# Patient Record
Sex: Male | Born: 1960 | Hispanic: No | Marital: Married | Smoking: Current every day smoker
Health system: Southern US, Community
[De-identification: ages and names within clinical notes are randomized; demographics above are authoritative.]

## PROBLEM LIST (undated history)

## (undated) DIAGNOSIS — A488 Other specified bacterial diseases: Secondary | ICD-10-CM

## (undated) DIAGNOSIS — F1910 Other psychoactive substance abuse, uncomplicated: Secondary | ICD-10-CM

## (undated) DIAGNOSIS — J189 Pneumonia, unspecified organism: Secondary | ICD-10-CM

## (undated) DIAGNOSIS — Q8901 Asplenia (congenital): Secondary | ICD-10-CM

## (undated) DIAGNOSIS — J939 Pneumothorax, unspecified: Secondary | ICD-10-CM

## (undated) DIAGNOSIS — A419 Sepsis, unspecified organism: Secondary | ICD-10-CM

## (undated) DIAGNOSIS — R6521 Severe sepsis with septic shock: Secondary | ICD-10-CM

## (undated) DIAGNOSIS — B1920 Unspecified viral hepatitis C without hepatic coma: Secondary | ICD-10-CM

## (undated) DIAGNOSIS — B9689 Other specified bacterial agents as the cause of diseases classified elsewhere: Secondary | ICD-10-CM

## (undated) DIAGNOSIS — F1990 Other psychoactive substance use, unspecified, uncomplicated: Secondary | ICD-10-CM

## (undated) HISTORY — DX: Asplenia (congenital): Q89.01

## (undated) HISTORY — DX: Sepsis, unspecified organism: A41.9

## (undated) HISTORY — DX: Other specified bacterial diseases: A48.8

## (undated) HISTORY — DX: Other specified bacterial agents as the cause of diseases classified elsewhere: B96.89

## (undated) HISTORY — DX: Pneumonia, unspecified organism: J18.9

## (undated) HISTORY — DX: Other psychoactive substance use, unspecified, uncomplicated: F19.90

## (undated) HISTORY — DX: Severe sepsis with septic shock: R65.21

---

## 2004-09-19 ENCOUNTER — Ambulatory Visit: Payer: Self-pay | Admitting: Gastroenterology

## 2013-03-05 DIAGNOSIS — J939 Pneumothorax, unspecified: Secondary | ICD-10-CM

## 2013-03-05 HISTORY — PX: OTHER SURGICAL HISTORY: SHX169

## 2013-03-05 HISTORY — DX: Pneumothorax, unspecified: J93.9

## 2013-08-04 ENCOUNTER — Inpatient Hospital Stay (HOSPITAL_COMMUNITY): Payer: BC Managed Care – PPO

## 2013-08-04 ENCOUNTER — Inpatient Hospital Stay (HOSPITAL_COMMUNITY)
Admission: AD | Admit: 2013-08-04 | Discharge: 2013-09-07 | DRG: 853 | Disposition: A | Discharge status: Skilled Nursing Facility | Payer: BC Managed Care – PPO | Source: Other Acute Inpatient Hospital | Attending: Internal Medicine | Admitting: Internal Medicine

## 2013-08-04 ENCOUNTER — Encounter (HOSPITAL_COMMUNITY): Payer: Self-pay | Admitting: Pulmonary Disease

## 2013-08-04 DIAGNOSIS — M009 Pyogenic arthritis, unspecified: Secondary | ICD-10-CM | POA: Diagnosis present

## 2013-08-04 DIAGNOSIS — I808 Phlebitis and thrombophlebitis of other sites: Secondary | ICD-10-CM

## 2013-08-04 DIAGNOSIS — R17 Unspecified jaundice: Secondary | ICD-10-CM | POA: Diagnosis present

## 2013-08-04 DIAGNOSIS — L089 Local infection of the skin and subcutaneous tissue, unspecified: Secondary | ICD-10-CM

## 2013-08-04 DIAGNOSIS — Z23 Encounter for immunization: Secondary | ICD-10-CM

## 2013-08-04 DIAGNOSIS — R310 Gross hematuria: Secondary | ICD-10-CM | POA: Diagnosis present

## 2013-08-04 DIAGNOSIS — I5021 Acute systolic (congestive) heart failure: Secondary | ICD-10-CM

## 2013-08-04 DIAGNOSIS — A419 Sepsis, unspecified organism: Secondary | ICD-10-CM

## 2013-08-04 DIAGNOSIS — R7309 Other abnormal glucose: Secondary | ICD-10-CM | POA: Diagnosis present

## 2013-08-04 DIAGNOSIS — I420 Dilated cardiomyopathy: Secondary | ICD-10-CM

## 2013-08-04 DIAGNOSIS — I4891 Unspecified atrial fibrillation: Secondary | ICD-10-CM

## 2013-08-04 DIAGNOSIS — I776 Arteritis, unspecified: Secondary | ICD-10-CM | POA: Diagnosis present

## 2013-08-04 DIAGNOSIS — E870 Hyperosmolality and hypernatremia: Secondary | ICD-10-CM | POA: Diagnosis not present

## 2013-08-04 DIAGNOSIS — R188 Other ascites: Secondary | ICD-10-CM | POA: Diagnosis present

## 2013-08-04 DIAGNOSIS — I38 Endocarditis, valve unspecified: Secondary | ICD-10-CM

## 2013-08-04 DIAGNOSIS — J80 Acute respiratory distress syndrome: Secondary | ICD-10-CM

## 2013-08-04 DIAGNOSIS — N179 Acute kidney failure, unspecified: Secondary | ICD-10-CM

## 2013-08-04 DIAGNOSIS — R509 Fever, unspecified: Secondary | ICD-10-CM

## 2013-08-04 DIAGNOSIS — R230 Cyanosis: Secondary | ICD-10-CM | POA: Diagnosis not present

## 2013-08-04 DIAGNOSIS — B1920 Unspecified viral hepatitis C without hepatic coma: Secondary | ICD-10-CM

## 2013-08-04 DIAGNOSIS — F17200 Nicotine dependence, unspecified, uncomplicated: Secondary | ICD-10-CM | POA: Diagnosis present

## 2013-08-04 DIAGNOSIS — R000 Tachycardia, unspecified: Secondary | ICD-10-CM | POA: Diagnosis not present

## 2013-08-04 DIAGNOSIS — Z9089 Acquired absence of other organs: Secondary | ICD-10-CM

## 2013-08-04 DIAGNOSIS — D696 Thrombocytopenia, unspecified: Secondary | ICD-10-CM

## 2013-08-04 DIAGNOSIS — R195 Other fecal abnormalities: Secondary | ICD-10-CM

## 2013-08-04 DIAGNOSIS — J9600 Acute respiratory failure, unspecified whether with hypoxia or hypercapnia: Secondary | ICD-10-CM

## 2013-08-04 DIAGNOSIS — D689 Coagulation defect, unspecified: Secondary | ICD-10-CM

## 2013-08-04 DIAGNOSIS — A498 Other bacterial infections of unspecified site: Secondary | ICD-10-CM | POA: Diagnosis present

## 2013-08-04 DIAGNOSIS — E46 Unspecified protein-calorie malnutrition: Secondary | ICD-10-CM | POA: Diagnosis present

## 2013-08-04 DIAGNOSIS — D692 Other nonthrombocytopenic purpura: Secondary | ICD-10-CM

## 2013-08-04 DIAGNOSIS — I2690 Septic pulmonary embolism without acute cor pulmonale: Secondary | ICD-10-CM | POA: Diagnosis present

## 2013-08-04 DIAGNOSIS — D638 Anemia in other chronic diseases classified elsewhere: Secondary | ICD-10-CM | POA: Diagnosis present

## 2013-08-04 DIAGNOSIS — G8929 Other chronic pain: Secondary | ICD-10-CM | POA: Diagnosis present

## 2013-08-04 DIAGNOSIS — I96 Gangrene, not elsewhere classified: Secondary | ICD-10-CM

## 2013-08-04 DIAGNOSIS — D72829 Elevated white blood cell count, unspecified: Secondary | ICD-10-CM

## 2013-08-04 DIAGNOSIS — D593 Hemolytic-uremic syndrome, unspecified: Secondary | ICD-10-CM

## 2013-08-04 DIAGNOSIS — E872 Acidosis, unspecified: Secondary | ICD-10-CM | POA: Diagnosis present

## 2013-08-04 DIAGNOSIS — F1410 Cocaine abuse, uncomplicated: Secondary | ICD-10-CM | POA: Diagnosis present

## 2013-08-04 DIAGNOSIS — D649 Anemia, unspecified: Secondary | ICD-10-CM

## 2013-08-04 DIAGNOSIS — R609 Edema, unspecified: Secondary | ICD-10-CM | POA: Diagnosis present

## 2013-08-04 DIAGNOSIS — E876 Hypokalemia: Secondary | ICD-10-CM | POA: Diagnosis not present

## 2013-08-04 DIAGNOSIS — N186 End stage renal disease: Secondary | ICD-10-CM | POA: Diagnosis present

## 2013-08-04 DIAGNOSIS — F1910 Other psychoactive substance abuse, uncomplicated: Secondary | ICD-10-CM | POA: Diagnosis present

## 2013-08-04 DIAGNOSIS — I4892 Unspecified atrial flutter: Secondary | ICD-10-CM | POA: Diagnosis present

## 2013-08-04 DIAGNOSIS — G9340 Encephalopathy, unspecified: Secondary | ICD-10-CM | POA: Diagnosis present

## 2013-08-04 DIAGNOSIS — R34 Anuria and oliguria: Secondary | ICD-10-CM | POA: Diagnosis present

## 2013-08-04 DIAGNOSIS — I509 Heart failure, unspecified: Secondary | ICD-10-CM | POA: Diagnosis present

## 2013-08-04 DIAGNOSIS — I428 Other cardiomyopathies: Secondary | ICD-10-CM | POA: Diagnosis present

## 2013-08-04 DIAGNOSIS — F1990 Other psychoactive substance use, unspecified, uncomplicated: Secondary | ICD-10-CM

## 2013-08-04 DIAGNOSIS — A488 Other specified bacterial diseases: Secondary | ICD-10-CM

## 2013-08-04 DIAGNOSIS — D62 Acute posthemorrhagic anemia: Secondary | ICD-10-CM | POA: Diagnosis not present

## 2013-08-04 DIAGNOSIS — IMO0002 Reserved for concepts with insufficient information to code with codable children: Secondary | ICD-10-CM | POA: Diagnosis present

## 2013-08-04 DIAGNOSIS — E669 Obesity, unspecified: Secondary | ICD-10-CM | POA: Diagnosis present

## 2013-08-04 DIAGNOSIS — B9689 Other specified bacterial agents as the cause of diseases classified elsewhere: Secondary | ICD-10-CM | POA: Diagnosis present

## 2013-08-04 DIAGNOSIS — L988 Other specified disorders of the skin and subcutaneous tissue: Secondary | ICD-10-CM | POA: Diagnosis present

## 2013-08-04 DIAGNOSIS — Z79899 Other long term (current) drug therapy: Secondary | ICD-10-CM

## 2013-08-04 DIAGNOSIS — D65 Disseminated intravascular coagulation [defibrination syndrome]: Secondary | ICD-10-CM

## 2013-08-04 DIAGNOSIS — K769 Liver disease, unspecified: Secondary | ICD-10-CM

## 2013-08-04 DIAGNOSIS — R7881 Bacteremia: Secondary | ICD-10-CM

## 2013-08-04 DIAGNOSIS — F411 Generalized anxiety disorder: Secondary | ICD-10-CM | POA: Diagnosis not present

## 2013-08-04 DIAGNOSIS — J15212 Pneumonia due to Methicillin resistant Staphylococcus aureus: Secondary | ICD-10-CM | POA: Diagnosis present

## 2013-08-04 HISTORY — DX: Other psychoactive substance abuse, uncomplicated: F19.10

## 2013-08-04 HISTORY — DX: Unspecified viral hepatitis C without hepatic coma: B19.20

## 2013-08-04 HISTORY — DX: Pneumothorax, unspecified: J93.9

## 2013-08-04 LAB — CBC WITH DIFFERENTIAL/PLATELET
Basophils Absolute: 0 10*3/uL (ref 0.0–0.1)
Basophils Absolute: 0 10*3/uL (ref 0.0–0.1)
Basophils Relative: 0 % (ref 0–1)
Eosinophils Absolute: 0 10*3/uL (ref 0.0–0.7)
Eosinophils Relative: 0 % (ref 0–5)
Eosinophils Relative: 0 % (ref 0–5)
HCT: 32.2 % — ABNORMAL LOW (ref 39.0–52.0)
Lymphs Abs: 0.5 10*3/uL — ABNORMAL LOW (ref 0.7–4.0)
Lymphs Abs: 0.4 10*3/uL — ABNORMAL LOW (ref 0.7–4.0)
MCH: 30.2 pg (ref 26.0–34.0)
MCHC: 36.3 g/dL — ABNORMAL HIGH (ref 30.0–36.0)
MCV: 83.7 fL (ref 78.0–100.0)
MCV: 84.1 fL (ref 78.0–100.0)
Monocytes Absolute: 0.2 10*3/uL (ref 0.1–1.0)
Monocytes Absolute: 0.4 10*3/uL (ref 0.1–1.0)
Neutro Abs: 9.1 10*3/uL — ABNORMAL HIGH (ref 1.7–7.7)
Neutro Abs: 14.9 10*3/uL — ABNORMAL HIGH (ref 1.7–7.7)
Neutrophils Relative %: 93 % — ABNORMAL HIGH (ref 43–77)
Platelets: <5 10*3/uL — CL (ref 150–400)
Platelets: 36 10*3/uL — ABNORMAL LOW (ref 150–400)
RDW: 16.2 % — ABNORMAL HIGH (ref 11.5–15.5)
RDW: 15.8 % — ABNORMAL HIGH (ref 11.5–15.5)

## 2013-08-04 LAB — COMPREHENSIVE METABOLIC PANEL
Albumin: 1.3 g/dL — ABNORMAL LOW (ref 3.5–5.2)
BUN: 77 mg/dL — ABNORMAL HIGH (ref 6–23)
Calcium: 5.6 mg/dL — CL (ref 8.4–10.5)
Chloride: 92 mEq/L — ABNORMAL LOW (ref 96–112)
Creatinine, Ser: 3.08 mg/dL — ABNORMAL HIGH (ref 0.50–1.35)
GFR calc Af Amer: 25 mL/min — ABNORMAL LOW (ref >90)
Glucose, Bld: 156 mg/dL — ABNORMAL HIGH (ref 70–99)
Total Bilirubin: 4.7 mg/dL — ABNORMAL HIGH (ref 0.3–1.2)
Total Protein: 3.5 g/dL — ABNORMAL LOW (ref 6.0–8.3)

## 2013-08-04 LAB — PHOSPHORUS: Phosphorus: 5 mg/dL — ABNORMAL HIGH (ref 2.3–4.6)

## 2013-08-04 LAB — VANCOMYCIN, RANDOM: Vancomycin Rm: 24.1 ug/mL

## 2013-08-04 LAB — ABO/RH: ABO/RH(D): O POS

## 2013-08-04 LAB — POCT I-STAT 3, ART BLOOD GAS (G3+)
Acid-Base Excess: 8 mmol/L — ABNORMAL HIGH (ref 0.0–2.0)
O2 Saturation: 99 %
pCO2 arterial: 46.9 mmHg — ABNORMAL HIGH (ref 35.0–45.0)

## 2013-08-04 LAB — GLUCOSE, CAPILLARY: Glucose-Capillary: 111 mg/dL — ABNORMAL HIGH (ref 70–99)

## 2013-08-04 LAB — HEPATIC FUNCTION PANEL
AST: 78 U/L — ABNORMAL HIGH (ref 0–37)
Albumin: 1.5 g/dL — ABNORMAL LOW (ref 3.5–5.2)
Alkaline Phosphatase: 116 U/L (ref 39–117)
Indirect Bilirubin: 0.3 mg/dL (ref 0.3–0.9)
Total Bilirubin: 5 mg/dL — ABNORMAL HIGH (ref 0.3–1.2)

## 2013-08-04 LAB — PROTIME-INR: Prothrombin Time: 20.1 seconds — ABNORMAL HIGH (ref 11.6–15.2)

## 2013-08-04 LAB — TYPE AND SCREEN
ABO/RH(D): O POS
Antibody Screen: NEGATIVE

## 2013-08-04 LAB — MAGNESIUM: Magnesium: 1.7 mg/dL (ref 1.5–2.5)

## 2013-08-04 MED ORDER — AMIODARONE LOAD VIA INFUSION
150.0000 mg | Freq: Once | INTRAVENOUS | Status: AC
  Administered 2013-08-04: 150 mg via INTRAVENOUS
  Filled 2013-08-04: qty 83.34

## 2013-08-04 MED ORDER — DEXTROSE 5 % IV SOLN
1.0000 g | INTRAVENOUS | Status: DC
  Administered 2013-08-05 – 2013-08-07 (×2): 1 g via INTRAVENOUS
  Filled 2013-08-04 (×3): qty 1

## 2013-08-04 MED ORDER — FENTANYL BOLUS VIA INFUSION
25.0000 ug | Freq: Four times a day (QID) | INTRAVENOUS | Status: DC | PRN
  Administered 2013-08-12 (×3): 125 ug via INTRAVENOUS
  Administered 2013-08-13: 50 ug via INTRAVENOUS
  Filled 2013-08-04: qty 100

## 2013-08-04 MED ORDER — HYDROCORTISONE SOD SUCCINATE 100 MG IJ SOLR
100.0000 mg | Freq: Three times a day (TID) | INTRAMUSCULAR | Status: DC
  Administered 2013-08-04 – 2013-08-05 (×3): 100 mg via INTRAVENOUS
  Filled 2013-08-04 (×6): qty 2

## 2013-08-04 MED ORDER — SODIUM CHLORIDE 0.9 % IV SOLN
25.0000 ug/h | INTRAVENOUS | Status: DC
  Administered 2013-08-04: 150 ug/h via INTRAVENOUS
  Administered 2013-08-05: 250 ug/h via INTRAVENOUS
  Administered 2013-08-05: 300 ug/h via INTRAVENOUS
  Administered 2013-08-05: 30 ug/h via INTRAVENOUS
  Administered 2013-08-06 – 2013-08-07 (×2): 300 ug/h via INTRAVENOUS
  Administered 2013-08-07 – 2013-08-08 (×3): 200 ug/h via INTRAVENOUS
  Administered 2013-08-10: 100 ug/h via INTRAVENOUS
  Administered 2013-08-12: 50 ug/h via INTRAVENOUS
  Administered 2013-08-13: 100 ug/h via INTRAVENOUS
  Filled 2013-08-04 (×16): qty 50

## 2013-08-04 MED ORDER — AMIODARONE HCL IN DEXTROSE 360-4.14 MG/200ML-% IV SOLN
60.0000 mg/h | INTRAVENOUS | Status: AC
  Administered 2013-08-04 (×2): 60 mg/h via INTRAVENOUS
  Filled 2013-08-04 (×2): qty 200

## 2013-08-04 MED ORDER — SODIUM CHLORIDE 0.9 % IV SOLN
INTRAVENOUS | Status: DC
  Administered 2013-08-04 – 2013-08-06 (×3): via INTRAVENOUS

## 2013-08-04 MED ORDER — SODIUM CHLORIDE 0.9 % IV SOLN
1.0000 mg/h | INTRAVENOUS | Status: AC
  Administered 2013-08-04 (×2): 5 mg/h via INTRAVENOUS
  Administered 2013-08-05 – 2013-08-06 (×3): 4 mg/h via INTRAVENOUS
  Administered 2013-08-07: 6 mg/h via INTRAVENOUS
  Administered 2013-08-07: 8 mg/h via INTRAVENOUS
  Administered 2013-08-08 (×2): 4 mg/h via INTRAVENOUS
  Filled 2013-08-04 (×14): qty 10

## 2013-08-04 MED ORDER — PHENYLEPHRINE HCL 10 MG/ML IJ SOLN
30.0000 ug/min | INTRAVENOUS | Status: DC
  Administered 2013-08-04 – 2013-08-05 (×2): 17.867 ug/min via INTRAVENOUS
  Filled 2013-08-04 (×2): qty 1

## 2013-08-04 MED ORDER — AMIODARONE HCL IN DEXTROSE 360-4.14 MG/200ML-% IV SOLN
30.0000 mg/h | INTRAVENOUS | Status: DC
  Administered 2013-08-04 – 2013-08-06 (×6): 30 mg/h via INTRAVENOUS
  Administered 2013-08-07 – 2013-08-09 (×8): 60 mg/h via INTRAVENOUS
  Administered 2013-08-10: 30 mg/h via INTRAVENOUS
  Filled 2013-08-04 (×32): qty 200

## 2013-08-04 MED ORDER — NOREPINEPHRINE BITARTRATE 1 MG/ML IJ SOLN
2.0000 ug/min | INTRAVENOUS | Status: DC
  Administered 2013-08-04: 2.667 ug/min via INTRAVENOUS
  Filled 2013-08-04: qty 4

## 2013-08-04 MED ORDER — SODIUM CHLORIDE 0.9 % IV SOLN
250.0000 mL | INTRAVENOUS | Status: DC | PRN
  Administered 2013-08-18: 250 mL via INTRAVENOUS
  Administered 2013-08-30 (×3): via INTRAVENOUS

## 2013-08-04 MED ORDER — SODIUM CHLORIDE 0.9 % IV BOLUS (SEPSIS)
1000.0000 mL | Freq: Once | INTRAVENOUS | Status: AC
  Administered 2013-08-04: 1000 mL via INTRAVENOUS

## 2013-08-04 MED ORDER — MIDAZOLAM HCL 2 MG/2ML IJ SOLN
2.0000 mg | INTRAMUSCULAR | Status: DC | PRN
  Filled 2013-08-04: qty 2

## 2013-08-04 MED ORDER — ETOMIDATE 2 MG/ML IV SOLN
INTRAVENOUS | Status: AC
  Administered 2013-08-04: 10 mg
  Filled 2013-08-04: qty 10

## 2013-08-04 MED ORDER — MIDAZOLAM BOLUS VIA INFUSION
1.0000 mg | INTRAVENOUS | Status: DC | PRN
  Filled 2013-08-04: qty 2

## 2013-08-04 MED ORDER — DEXTROSE 5 % IV SOLN
2.0000 g | Freq: Once | INTRAVENOUS | Status: AC
  Administered 2013-08-04: 2 g via INTRAVENOUS
  Filled 2013-08-04: qty 2

## 2013-08-04 MED ORDER — PANTOPRAZOLE SODIUM 40 MG IV SOLR
40.0000 mg | Freq: Every day | INTRAVENOUS | Status: DC
  Administered 2013-08-04 – 2013-08-15 (×12): 40 mg via INTRAVENOUS
  Filled 2013-08-04 (×15): qty 40

## 2013-08-04 MED ORDER — SODIUM BICARBONATE 8.4 % IV SOLN
INTRAVENOUS | Status: DC
  Administered 2013-08-04: 18:00:00 via INTRAVENOUS
  Filled 2013-08-04 (×2): qty 150
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
## 2013-08-04 NOTE — Progress Notes (Signed)
eLink Physician-Brief Progress Note Patient Name: Koki Buxton DOB: 13-Mar-1961 MRN: [id number]  Date of Service  08/04/2013   HPI/Events of Note     eICU Interventions  Consulted Heme for low plts Rpt plt count after 1 U   Intervention Category Intermediate Interventions: Thrombocytopenia - evaluation and management  Delainee Tramel V. 08/04/2013, 7:25 PM
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
## 2013-08-05 ENCOUNTER — Encounter: Payer: Self-pay | Admitting: Dermatology

## 2013-08-05 ENCOUNTER — Other Ambulatory Visit: Payer: Self-pay | Admitting: Dermatology

## 2013-08-05 ENCOUNTER — Inpatient Hospital Stay (HOSPITAL_COMMUNITY): Payer: BC Managed Care – PPO

## 2013-08-05 ENCOUNTER — Encounter (HOSPITAL_COMMUNITY): Payer: Self-pay | Admitting: Pulmonary Disease

## 2013-08-05 DIAGNOSIS — J9589 Other postprocedural complications and disorders of respiratory system, not elsewhere classified: Secondary | ICD-10-CM

## 2013-08-05 DIAGNOSIS — A419 Sepsis, unspecified organism: Secondary | ICD-10-CM

## 2013-08-05 DIAGNOSIS — D593 Hemolytic-uremic syndrome, unspecified: Secondary | ICD-10-CM | POA: Insufficient documentation

## 2013-08-05 DIAGNOSIS — N179 Acute kidney failure, unspecified: Secondary | ICD-10-CM

## 2013-08-05 DIAGNOSIS — D696 Thrombocytopenia, unspecified: Secondary | ICD-10-CM

## 2013-08-05 DIAGNOSIS — F1910 Other psychoactive substance abuse, uncomplicated: Secondary | ICD-10-CM

## 2013-08-05 DIAGNOSIS — I999 Unspecified disorder of circulatory system: Secondary | ICD-10-CM

## 2013-08-05 DIAGNOSIS — I369 Nonrheumatic tricuspid valve disorder, unspecified: Secondary | ICD-10-CM

## 2013-08-05 DIAGNOSIS — J9600 Acute respiratory failure, unspecified whether with hypoxia or hypercapnia: Secondary | ICD-10-CM

## 2013-08-05 DIAGNOSIS — N19 Unspecified kidney failure: Secondary | ICD-10-CM

## 2013-08-05 LAB — PREPARE PLATELET PHERESIS

## 2013-08-05 LAB — GLUCOSE, CAPILLARY
Glucose-Capillary: 117 mg/dL — ABNORMAL HIGH (ref 70–99)
Glucose-Capillary: 122 mg/dL — ABNORMAL HIGH (ref 70–99)
Glucose-Capillary: 89 mg/dL (ref 70–99)

## 2013-08-05 LAB — COMPREHENSIVE METABOLIC PANEL
ALT: 18 U/L (ref 0–53)
AST: 96 U/L — ABNORMAL HIGH (ref 0–37)
BUN: 91 mg/dL — ABNORMAL HIGH (ref 6–23)
CO2: 33 mEq/L — ABNORMAL HIGH (ref 19–32)
Calcium: 6.3 mg/dL — CL (ref 8.4–10.5)
Chloride: 92 mEq/L — ABNORMAL LOW (ref 96–112)
Creatinine, Ser: 3.72 mg/dL — ABNORMAL HIGH (ref 0.50–1.35)
GFR calc non Af Amer: 17 mL/min — ABNORMAL LOW (ref >90)
Sodium: 140 mEq/L (ref 135–145)
Total Bilirubin: 5.7 mg/dL — ABNORMAL HIGH (ref 0.3–1.2)

## 2013-08-05 LAB — FOLATE: Folate: 6.6 ng/mL

## 2013-08-05 LAB — HIV ANTIBODY (ROUTINE TESTING W REFLEX): HIV: NONREACTIVE

## 2013-08-05 LAB — PROTIME-INR: INR: 1.78 — ABNORMAL HIGH (ref 0.00–1.49)

## 2013-08-05 LAB — CBC
HCT: 31.8 % — ABNORMAL LOW (ref 39.0–52.0)
HCT: 31.8 % — ABNORMAL LOW (ref 39.0–52.0)
HCT: 30.8 % — ABNORMAL LOW (ref 39.0–52.0)
Hemoglobin: 11.6 g/dL — ABNORMAL LOW (ref 13.0–17.0)
Hemoglobin: 11.5 g/dL — ABNORMAL LOW (ref 13.0–17.0)
Hemoglobin: 11 g/dL — ABNORMAL LOW (ref 13.0–17.0)
MCHC: 36.5 g/dL — ABNORMAL HIGH (ref 30.0–36.0)
MCV: 83.5 fL (ref 78.0–100.0)
MCV: 84.1 fL (ref 78.0–100.0)
MCV: 84.8 fL (ref 78.0–100.0)
RBC: 3.78 MIL/uL — ABNORMAL LOW (ref 4.22–5.81)
RBC: 3.63 MIL/uL — ABNORMAL LOW (ref 4.22–5.81)
RDW: 15.8 % — ABNORMAL HIGH (ref 11.5–15.5)
WBC: 16.8 10*3/uL — ABNORMAL HIGH (ref 4.0–10.5)
WBC: 15.8 10*3/uL — ABNORMAL HIGH (ref 4.0–10.5)
WBC: 15.1 10*3/uL — ABNORMAL HIGH (ref 4.0–10.5)

## 2013-08-05 LAB — D-DIMER, QUANTITATIVE: D-Dimer, Quant: 12.61 ug/mL-FEU — ABNORMAL HIGH (ref 0.00–0.48)

## 2013-08-05 LAB — HEPATITIS PANEL, ACUTE
Hep A IgM: NONREACTIVE
Hep B C IgM: NONREACTIVE
Hepatitis B Surface Ag: NEGATIVE

## 2013-08-05 LAB — SAVE SMEAR

## 2013-08-05 LAB — POCT I-STAT 3, ART BLOOD GAS (G3+)
TCO2: 37 mmol/L (ref 0–100)
pCO2 arterial: 47.7 mmHg — ABNORMAL HIGH (ref 35.0–45.0)
pH, Arterial: 7.485 — ABNORMAL HIGH (ref 7.350–7.450)
pO2, Arterial: 83 mmHg (ref 80.0–100.0)

## 2013-08-05 LAB — RETICULOCYTES: RBC.: 3.42 MIL/uL — ABNORMAL LOW (ref 4.22–5.81)

## 2013-08-05 LAB — DIRECT ANTIGLOBULIN TEST (NOT AT ARMC): DAT, IgG: NEGATIVE

## 2013-08-05 LAB — PHOSPHORUS: Phosphorus: 5.3 mg/dL — ABNORMAL HIGH (ref 2.3–4.6)

## 2013-08-05 LAB — SEDIMENTATION RATE: Sed Rate: 22 mm/hr — ABNORMAL HIGH (ref 0–16)

## 2013-08-05 MED ORDER — ACETAMINOPHEN 650 MG RE SUPP
650.0000 mg | Freq: Four times a day (QID) | RECTAL | Status: DC | PRN
  Administered 2013-08-05 – 2013-08-07 (×3): 650 mg via RECTAL
  Filled 2013-08-05 (×4): qty 1

## 2013-08-05 MED ORDER — HEPARIN SODIUM (PORCINE) 1000 UNIT/ML DIALYSIS
1000.0000 [IU] | INTRAMUSCULAR | Status: DC | PRN
  Administered 2013-08-12: 2800 [IU] via INTRAVENOUS_CENTRAL

## 2013-08-05 MED ORDER — ETOMIDATE 2 MG/ML IV SOLN
INTRAVENOUS | Status: AC
  Filled 2013-08-05: qty 10

## 2013-08-05 MED ORDER — INFLUENZA VAC SPLIT QUAD 0.5 ML IM SUSP: 0.5000 mL | INTRAMUSCULAR | Status: DC | PRN

## 2013-08-05 MED ORDER — VECURONIUM BROMIDE 10 MG IV SOLR
INTRAVENOUS | Status: AC
  Filled 2013-08-05: qty 10

## 2013-08-05 MED ORDER — SODIUM CHLORIDE 0.9 % IV SOLN: 100.0000 mL | INTRAVENOUS | Status: DC | PRN

## 2013-08-05 MED ORDER — NEPRO/CARBSTEADY PO LIQD
237.0000 mL | ORAL | Status: DC | PRN
  Filled 2013-08-05: qty 237

## 2013-08-05 MED ORDER — BIOTENE DRY MOUTH MT LIQD
15.0000 mL | Freq: Four times a day (QID) | OROMUCOSAL | Status: DC
  Administered 2013-08-06 – 2013-08-14 (×36): 15 mL via OROMUCOSAL

## 2013-08-05 MED ORDER — ALTEPLASE 2 MG IJ SOLR
2.0000 mg | Freq: Once | INTRAMUSCULAR | Status: AC | PRN
  Filled 2013-08-05: qty 2

## 2013-08-05 MED ORDER — PRO-STAT SUGAR FREE PO LIQD
60.0000 mL | Freq: Three times a day (TID) | ORAL | Status: DC
  Administered 2013-08-05 – 2013-08-13 (×26): 60 mL
  Filled 2013-08-05 (×33): qty 60

## 2013-08-05 MED ORDER — LIDOCAINE-PRILOCAINE 2.5-2.5 % EX CREA
1.0000 "application " | TOPICAL_CREAM | CUTANEOUS | Status: DC | PRN
  Filled 2013-08-05: qty 5

## 2013-08-05 MED ORDER — CHLORHEXIDINE GLUCONATE 0.12 % MT SOLN
15.0000 mL | Freq: Two times a day (BID) | OROMUCOSAL | Status: DC
  Administered 2013-08-05 – 2013-08-14 (×18): 15 mL via OROMUCOSAL
  Filled 2013-08-05 (×17): qty 15

## 2013-08-05 MED ORDER — METRONIDAZOLE IN NACL 5-0.79 MG/ML-% IV SOLN
500.0000 mg | Freq: Three times a day (TID) | INTRAVENOUS | Status: DC
  Administered 2013-08-05 – 2013-08-07 (×7): 500 mg via INTRAVENOUS
  Filled 2013-08-05 (×9): qty 100

## 2013-08-05 MED ORDER — OXEPA PO LIQD
1000.0000 mL | ORAL | Status: DC
  Filled 2013-08-05 (×2): qty 1000

## 2013-08-05 MED ORDER — OXEPA PO LIQD
1000.0000 mL | ORAL | Status: DC
  Filled 2013-08-05: qty 1000

## 2013-08-05 MED ORDER — LIDOCAINE HCL (PF) 1 % IJ SOLN: 5.0000 mL | INTRAMUSCULAR | Status: DC | PRN

## 2013-08-05 MED ORDER — PENTAFLUOROPROP-TETRAFLUOROETH EX AERO: 1.0000 "application " | INHALATION_SPRAY | CUTANEOUS | Status: DC | PRN

## 2013-08-05 MED ORDER — ANTICOAGULANT SODIUM CITRATE 4% (200MG/5ML) IV SOLN
5.0000 mL | Status: DC | PRN
  Administered 2013-08-05 – 2013-08-10 (×4): 5 mL via INTRAVENOUS_CENTRAL
  Filled 2013-08-05 (×7): qty 250

## 2013-08-05 MED ORDER — METHYLPREDNISOLONE SODIUM SUCC 125 MG IJ SOLR
60.0000 mg | Freq: Three times a day (TID) | INTRAMUSCULAR | Status: DC
  Administered 2013-08-05 – 2013-08-08 (×9): 60 mg via INTRAVENOUS
  Filled 2013-08-05 (×12): qty 0.96

## 2013-08-05 MED ORDER — PNEUMOCOCCAL VAC POLYVALENT 25 MCG/0.5ML IJ INJ: 0.5000 mL | INJECTION | INTRAMUSCULAR | Status: DC | PRN

## 2013-08-05 MED ORDER — OXEPA PO LIQD
1000.0000 mL | ORAL | Status: DC
  Administered 2013-08-05 – 2013-08-09 (×5): 1000 mL
  Filled 2013-08-05 (×7): qty 1000

## 2013-08-05 MED ORDER — NOREPINEPHRINE BITARTRATE 1 MG/ML IJ SOLN
2.0000 ug/min | INTRAVENOUS | Status: DC
  Filled 2013-08-05 (×4): qty 4

## 2013-08-05 NOTE — Progress Notes (Signed)
  Echocardiogram 2D Echocardiogram has been performed.  Timothy Kline 08/05/2013, 12:22 PM

## 2013-08-05 NOTE — Progress Notes (Signed)
Assessment:  1 Thrombocytopenia, r/o Opana induced TTP  2 Oliguric ARF  3 VDRF  4 Polysubstance abuse  5 Hep C ab Pos  6 Shock syndrome  7 Skin sloughing/acral cyanosis Plan:  1 supportive therapy, agree with plans for dialysis with volume removal for pulmonary edema 2 r/o TTP, Hematology & Dermatoloy Consult requested by CCM  Subjective: Interval History: worsening skin issues  Objective: Vital signs in last 24 hours: Temp:  [99.6 F (37.6 C)-102.5 F (39.2 C)] 102.3 F (39.1 C) (11/01 0615) Pulse Rate:  [32-153] 111 (11/01 0831) Resp:  [14-34] 29 (11/01 0831) BP: (86-146)/(60-98) 123/75 mmHg (11/01 0831) SpO2:  [89 %-99 %] 96 % (11/01 0831) FiO2 (%):  [50 %-70 %] 50 % (11/01 0831) Weight:  [106.6 kg (235 lb 0.2 oz)] 106.6 kg (235 lb 0.2 oz) (11/01 0500) Weight change:   Intake/Output from previous day: 10/31 0701 - 11/01 0700 In: 5114.5 [I.V.:3254.5; Blood:780; IV Piggyback:1050] Out: 75 [Urine:75] Intake/Output this shift:    General appearance: unresponsive GI: soft, non-tender; bowel sounds normal; no masses,  no organomegaly Extremities: edema trace le and acral cyanosis with demarcation of feet and RUE that was not present yesterday Skin: right face with some petechia on cheek, sloughing of RUE  Lab Results:  Recent Labs  08/05/13 0500 08/05/13 0630  WBC 16.8* 15.8*  HGB 11.6* 11.5*  HCT 31.8* 31.8*  PLT 14* 11*   BMET:  Recent Labs  08/04/13 1622 08/05/13 0500  NA 141 140  K 3.3* 4.3  CL 92* 92*  CO2 33* 33*  GLUCOSE 156* 134*  BUN 77* 91*  CREATININE 3.08* 3.72*  CALCIUM 5.6* 6.3*   No results found for this basename: PTH,  in the last 72 hours Iron Studies: No results found for this basename: IRON, TIBC, TRANSFERRIN, FERRITIN,  in the last 72 hours Studies/Results: Dg Chest Port 1 View  08/05/2013   CLINICAL DATA:  Evaluate endotracheal tube.  EXAM: PORTABLE CHEST - 1 VIEW  COMPARISON:  08/04/2013  FINDINGS: Endotracheal tube is 7.7 cm  above the carina. There are cardiac pads on the chest. Right jugular central venous catheter in the SVC region. Increased densities in the right lower lung are suggestive for increased edema or pleural fluid. Heart size is stable. Nasogastric tube extends into the abdomen.  IMPRESSION: There are increased densities in the right lower chest. Findings may represent worsening edema and/or pleural fluid.  Support apparatuses as described.   Electronically Signed   By: Richarda Overlie M.D.   On: 08/05/2013 07:33   Dg Chest Port 1 View  08/04/2013   CLINICAL DATA:  Check endotracheal tube  EXAM: PORTABLE CHEST - 1 VIEW  COMPARISON:  08/04/2013 1536 hrs  FINDINGS: The cardiac shadow is stable. Patchy infiltrative changes are again identified bilaterally. A right-sided central venous line, nasogastric catheter and endotracheal tube are again seen and stable. The endotracheal tube again lies approximately 6.4 cm above the carinal. No other focal abnormality is seen.  IMPRESSION: Stable appearance of tubes and lines as described.  Patchy infiltrates bilaterally stable from the previous film   Electronically Signed   By: Alcide Clever M.D.   On: 08/04/2013 17:56   Dg Chest Port 1 View  08/04/2013   CLINICAL DATA:  Respiratory difficulty  EXAM: PORTABLE CHEST - 1 VIEW  COMPARISON:  08/04/2013  FINDINGS: Endotracheal tube, NG tube, right internal jugular central venous catheter are stable. The endotracheal tube remains 6.4 cm from the carinal. Extensive patchy  bilateral airspace disease is not significantly changed. No pneumothorax.  IMPRESSION: Stable bilateral airspace disease.   Electronically Signed   By: Maryclare Bean M.D.   On: 08/04/2013 15:45   Scheduled: . ceFEPime (MAXIPIME) IV  1 g Intravenous Q24H  . feeding supplement (OXEPA)  1,000 mL Per Tube Q24H  . hydrocortisone sod succinate (SOLU-CORTEF) inj  100 mg Intravenous Q8H  . pantoprazole (PROTONIX) IV  40 mg Intravenous QHS    LOS: 1 day   Meelah Tallo  C 08/05/2013,9:00 AM
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
## 2013-08-05 NOTE — Procedures (Signed)
Central Venous Catheter Insertion Procedure Note -hd cath Jered Heiny [id number] [birth year]  Procedure: Insertion of Central Venous Catheter Indications: hd  Procedure Details Consent: Risks of procedure as well as the alternatives and risks of each were explained to the (patient/caregiver).  Consent for procedure obtained. Time Out: Verified patient identification, verified procedure, site/side was marked, verified correct patient position, special equipment/implants available, medications/allergies/relevent history reviewed, required imaging and test results available.  Performed  Maximum sterile technique was used including antiseptics, cap, gloves, gown, hand hygiene, mask and sheet. Skin prep: Chlorhexidine; local anesthetic administered A antimicrobial bonded/coated triple lumen catheter was placed in the right femoral vein due to patient being a dialysis patient using the Seldinger technique.  Evaluation Blood flow good Complications: No apparent complications Patient did tolerate procedure well. Chest X-ray ordered to verify placement.  CXR: normal.  FEINSTEIN,DANIEL J. 08/05/2013, 11:11 PM  [place] guidabce No neck plat 7 k Plat given prior  Mcarthur Rossetti. Tyson Alias, MD, FACP Pgr: [phone] [facility]

## 2013-08-05 NOTE — Progress Notes (Signed)
eLink Physician-Brief Progress Note Patient Name: Timothy Kline DOB: [birth year] MRN: [id number]  Date of Service  08/05/2013   HPI/Events of Note  Patient with thrombocytopenia with plts less than 5 at 1622.  Received plt txf and up to 36 at 2000.  Now 14.  Recommendations by Hematology are to maintain plt count of 20K.  Patient diffuse hemorrhagic/purpural lesions on arms/feet/hands.   eICU Interventions  Plan: PCCM to evaluate lesions at bedside Transfuse 6 pack of plts Post-transfusion plt check   Intervention Category Intermediate Interventions: Thrombocytopenia - evaluation and management  Ammiel Guiney 08/05/2013, 5:54 AM

## 2013-08-05 NOTE — Consult Note (Signed)
52 year old caucasian man with hx of cocaine/heroin use and hep C admitted with fever, ARDS and renal failure.  Has developed progressive skin eruption on arms/legs in past 24 hours.  Has thrombocytopenia, increased WBC with 90% neuts and progressive renal failure. Apparent schistosomes on peripheral smear.  Exam :  Reticulated purpuric and erythematous ppauled and plaques on feet, hands and dorsal forearms.  Numerous vesicles, some purpuric noted at periphery of purpuric plaques on forearms and dorsal hands.Crusted plaques on buccal mucosa  Assessment:  Purpuric and vesicular acral dermatitis in febrile patient.  Vasculitis/vasculopathy likely, possibly sepsis induced.    Plan:  4 mm punch biopsy done under local anesthesia with 1% Lidocaine on right forearm, sutured with 4.0 nylon.  Bandage applied.please clean site daily and apply vaseline and bandaid.  Suture removal 10 days.    Expect report on Aug 07, 2013  Please contact if we can be of any further help.   Thanks for consult

## 2013-08-05 NOTE — Evaluation (Signed)
Called at bedside to evaluate new rash. Positive Nikolsky sign in both arms. Seems to have nasal mucosa involvement. I'm not sure about his mouth mucosa. Consider dermatology consult.  Overton Mam, MD [facility] PCCM
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
## 2013-08-05 NOTE — Progress Notes (Signed)
Progress Note:  Subjective: Please see consult note done by my partner yesterday. He remains critically ill  , intubated, rapidly progressive renal failure with creatinine now up to 6, dialysis to begin. He has developed progressive extremity ischemia and enlarging areas of ischemic necrosis of the skin of both of his arms. He had a minor rise in his platelet count up to 36,000 after a unit of platelets with rapid fall down to today's value of 11,000. Hemoglobin is stable at 11.5 g. Bilirubin remains elevated at 5.7 and is primarily indirect. Repeat LDH and reticulocyte count pending. Of note haptoglobin done yesterday was normal.    Vitals: Filed Vitals:   08/05/13 1000  BP: 103/80  Pulse: 107  Temp: 99.9 F (37.7 C)  Resp: 24   Wt Readings from Last 3 Encounters:  08/05/13 235 lb 0.2 oz (106.6 kg)     PHYSICAL EXAM:  General: intubated and sedated; multiple intravenous and central venous lines right internal jugular and bilateral peripheral arm. Foley catheter in place with burgundy urine. Head: Eyes: Pupils are equal and reactive to light Throat: Intubated Neck: Lymph Nodes: Lungs: Coarse rhonchi right lung anteriorly Breasts:  Cardiac: Irregularly irregular rhythm no murmur Abdominal: Distended, no pain response on palpation, positive ascites, no palpable hepatosplenomegaly Extremities: 2+ edema including scrotal edema. Vascular:  Advanced cyanotic changes of all of his digits both hands and feet; ulnar pulses 1+ symmetric, dorsalis pedis pulses 2+ symmetric Neurologic intubated. Hard to assess. 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  EXAM: PORTABLE CHEST - 1 VIEW  COMPARISON:  08/04/2013  FINDINGS: Endotracheal tube is 7.7 cm above the carina. There are cardiac pads on the chest. Right jugular central venous catheter in the SVC region. Increased densities in the right lower lung are suggestive for increased edema or pleural fluid. Heart size is stable. Nasogastric tube extends into the abdomen.  IMPRESSION: There are increased densities in the right lower chest. Findings may represent worsening edema and/or pleural fluid.  Support apparatuses as described.   Electronically Signed   By: Richarda Overlie M.D.   On: 08/05/2013 07:33   Dg Chest Port 1 View  08/04/2013   CLINICAL DATA:  Check endotracheal tube  EXAM: PORTABLE CHEST - 1 VIEW  COMPARISON:  08/04/2013 1536 hrs  FINDINGS: The cardiac shadow is stable. Patchy infiltrative changes are again identified bilaterally. A right-sided central venous line, nasogastric catheter and endotracheal tube are again seen and stable. The endotracheal tube again lies approximately 6.4 cm above the carinal. No other focal abnormality is seen.  IMPRESSION: Stable appearance of tubes and lines as described.  Patchy infiltrates bilaterally stable from the previous film   Electronically Signed   By: Alcide Clever M.D.   On: 08/04/2013 17:56   Dg Chest Port 1 View  08/04/2013   CLINICAL DATA:  Respiratory difficulty  EXAM: PORTABLE CHEST - 1 VIEW  COMPARISON:  08/04/2013  FINDINGS: Endotracheal tube,  NG tube, right internal jugular central venous catheter are stable. The endotracheal tube remains 6.4 cm from the carinal. Extensive patchy bilateral airspace disease is not significantly changed. No pneumothorax.  IMPRESSION: Stable bilateral airspace disease.   Electronically Signed   By: Maryclare Bean M.D.   On: 08/04/2013 15:45     Patient Active Problem List   Diagnosis Date Noted  .  Acute respiratory failure 08/04/2013  . Septic shock 08/04/2013  . Acute renal failure 08/04/2013  . ARDS (adult respiratory distress syndrome) 08/04/2013     review of blood film from October 31: Multiple abnormalities including presence of schistocytes, micro-spherocytes, burr cells, target cells. No polychromasia. Platelets decreased.  Assessment and Plan:  Situation is very complex. I agree with my partner at present there does not appear to be a significant element of hemolysis which would argue more for DIC associated with sepsis and not Opana induced HUS. However, the changes on the peripheral blood film could also be seen with TTP/HUS syndrome . LDH is an unreliable indicator of hemolysis given significant ischemic necrosis of his skin, extremities, and likely other organs including his kidneys.  Recommendation: I don't think that there is an indication for plasma exchange at this point in time. Even if this is a variation of Opana induced HUS, current recommendation is supportive care only and not plasma exchange, unless there is a question about the diagnosis, until results of a ADAMTS enzyme are available. I will go ahead and order an enzyme study. This is a von Willebrand factor cleaving metalloproteinase enzyme which is typically consumed therefore low or undetectable levels with TTP but typically normal in HUS secondary to different pathophysiology of the microangiopathic process which involves abnormal complement activation.. Continue antibiotics and other supportive measures.     GRANFORTUNA,JAMES M 08/05/2013, 11:55 AM

## 2013-08-05 NOTE — H&P (Signed)
PULMONARY  / CRITICAL CARE MEDICINE  Name: Timothy Kline MRN: [id number] DOB: 1961/04/24    ADMISSION DATE:  08/04/2013 CONSULTATION DATE:  10/31  REFERRING MD :  [facility] PRIMARY SERVICE: PCCM  CHIEF COMPLAINT:  Respiratory Failure/ Sepsis/ Renal Failure  BRIEF PATIENT DESCRIPTION: 52 year old male presented to [facility] 10/28 with AMS/Sepsis in setting of cocaine /oxycodone use. Pt admitted with Respiratory Failure (ARDS)/Sepsis has developed acute renal failure, thrombocytopenia, a-fib Flutter with RVR and was transferred to [facility] on 10/31 for HD and further care. CCM asked to admit.  SIGNIFICANT EVENTS / STUDIES:  10/28: Admission to [facility] 10/29: Metabolic Acidosis, pH=6.9 ....................................................................................... 10/31: Transferred to [facility] for HD. 10/31: Cardioversion for Afib/Flutter rate 170's ( Unsuccessful).  Amiodarone Gtt initiated for Afib/Flutter RVR  11/1- new rash concerns, offf pressors this am   LINES / TUBES: 10/28: Right IJ CVC>>> 10/29: oral ETT>>>  CULTURES: BCx2 10/31>>> Sputum 10/31>>>  ANTIBIOTICS: Rocephin: 10/28?>>>10/31 Levoquin:   10/28>>>10/31 Diflucan 10/29 Primaxin 10/29 .....................................................Marland Kitchen Vancomycin 10/28>>> Cefipime 10/31>>>  VITAL SIGNS: Temp:  [99.6 F (37.6 C)-102.5 F (39.2 C)] 102.3 F (39.1 C) (11/01 0615) Pulse Rate:  [32-153] 32 (11/01 0615) Resp:  [14-34] 30 (11/01 0615) BP: (86-146)/(60-98) 126/86 mmHg (11/01 0615) SpO2:  [89 %-99 %] 89 % (11/01 0615) FiO2 (%):  [60 %-70 %] 60 % (11/01 0405) Weight:  [106.6 kg (235 lb 0.2 oz)] 106.6 kg (235 lb 0.2 oz) (11/01 0500)  HEMODYNAMICS: CVP:  [14 mmHg-27 mmHg] 27 mmHg  VENTILATOR SETTINGS: Vent Mode:  [-] PRVC FiO2 (%):  [60 %-70 %] 60 % Set Rate:  [10 bmp-16 bmp] 16 bmp Vt Set:  [470 mL-620 mL] 470 mL PEEP:  [10 cmH20-12 cmH20] 10 cmH20 Plateau Pressure:  [20 cmH20-29  cmH20] 29 cmH20  INTAKE / OUTPUT: Intake/Output     10/31 0701 - 11/01 0700 11/01 0701 - 11/02 0700   I.V. (mL/kg) 3254.5 (30.5)    Blood 780    Other 30    IV Piggyback 1050    Total Intake(mL/kg) 5114.5 (48)    Urine (mL/kg/hr) 75    Total Output 75     Net +5039.5           PHYSICAL EXAMINATION: General: Sedated on vent , rass -3 Neuro: Arouses to stimulation  HEENT:  Multiple oral buccal lesions Cardiovascular:A-fib Flutter with RVR, well controlled 120 Lungs: Rhonchi Abdomen:Soft and flat,non-tender to palpation, hypoactive BS, OG  Musculoskeletal: MAE x 4, Generalized weakness   Skin: feet with peripheral mottled lesions, toes, pulses wnl, not cold, rue large area sloughed top layer skin, wheeping  LABS:  CBC Recent Labs     08/04/13  2000  08/05/13  0500  08/05/13  0630  WBC  15.7*  16.8*  15.8*  HGB  11.7*  11.6*  11.5*  HCT  32.2*  31.8*  31.8*  PLT  36*  14*  11*   Coag's Recent Labs     08/04/13  2000  08/05/13  0500  APTT  35   --   INR  1.77*  1.78*   BMET Recent Labs     08/04/13  1622  08/05/13  0500  NA  141  140  K  3.3*  4.3  CL  92*  92*  CO2  33*  33*  BUN  77*  91*  CREATININE  3.08*  3.72*  GLUCOSE  156*  134*   Electrolytes Recent Labs     08/04/13  1622  08/05/13  0500  CALCIUM  5.6*  6.3*  MG  1.7  1.9  PHOS  5.0*  5.3*   Sepsis Markers No results found for this basename: LACTICACIDVEN, PROCALCITON, O2SATVEN,  in the last 72 hours ABG Recent Labs     08/04/13  1710  08/05/13  0411  PHART  7.458*  7.485*  PCO2ART  46.9*  47.7*  PO2ART  119.0*  83.0   Liver Enzymes Recent Labs     08/04/13  1622  08/04/13  2000  08/05/13  0500  AST  75*  78*  96*  ALT  16  17  18   ALKPHOS  103  116  110  BILITOT  4.7*  5.0*  5.7*  ALBUMIN  1.3*  1.5*  1.5*   Cardiac Enzymes Recent Labs     08/04/13  2350  TROPONINI  <0.30   Glucose Recent Labs     08/04/13  1545  08/04/13  2000  08/04/13  2004  08/05/13  0012   08/05/13  0405  08/05/13  0729  GLUCAP  111*  <10*  92  89  117*  134*    Imaging Dg Chest Port 1 View  08/05/2013   CLINICAL DATA:  Evaluate endotracheal tube.  EXAM: PORTABLE CHEST - 1 VIEW  COMPARISON:  08/04/2013  FINDINGS: Endotracheal tube is 7.7 cm above the carina. There are cardiac pads on the chest. Right jugular central venous catheter in the SVC region. Increased densities in the right lower lung are suggestive for increased edema or pleural fluid. Heart size is stable. Nasogastric tube extends into the abdomen.  IMPRESSION: There are increased densities in the right lower chest. Findings may represent worsening edema and/or pleural fluid.  Support apparatuses as described.   Electronically Signed   By: Richarda Overlie M.D.   On: 08/05/2013 07:33   Dg Chest Port 1 View  08/04/2013   CLINICAL DATA:  Check endotracheal tube  EXAM: PORTABLE CHEST - 1 VIEW  COMPARISON:  08/04/2013 1536 hrs  FINDINGS: The cardiac shadow is stable. Patchy infiltrative changes are again identified bilaterally. A right-sided central venous line, nasogastric catheter and endotracheal tube are again seen and stable. The endotracheal tube again lies approximately 6.4 cm above the carinal. No other focal abnormality is seen.  IMPRESSION: Stable appearance of tubes and lines as described.  Patchy infiltrates bilaterally stable from the previous film   Electronically Signed   By: Alcide Clever M.D.   On: 08/04/2013 17:56   Dg Chest Port 1 View  08/04/2013   CLINICAL DATA:  Respiratory difficulty  EXAM: PORTABLE CHEST - 1 VIEW  COMPARISON:  08/04/2013  FINDINGS: Endotracheal tube, NG tube, right internal jugular central venous catheter are stable. The endotracheal tube remains 6.4 cm from the carinal. Extensive patchy bilateral airspace disease is not significantly changed. No pneumothorax.  IMPRESSION: Stable bilateral airspace disease.   Electronically Signed   By: Maryclare Bean M.D.   On: 08/04/2013 15:45    ASSESSMENT /  PLAN:  PULMONARY A:  Acute Respiratory Failure ARDS Edema component 11/1 P:   ARDS protocol, keep plat less 30 Advance ett Reduce rate Plat are low, no role drop TV further abg in am  To goal peep 8 if able Consider hd, as edema increased  CARDIOVASCULAR A:  Hypotension 2/2 sepsis - unclear etiology, suspicion for endocarditis but ECHO pending - reslvined pressor needs A-fib/Flutter with RVR - s/p cardioversion 10/31 without success P:  Amio  drip maintain 2-D echo awaited, TEE likely needed, however concerns low plat  Pending cortisol level, but off pressors now, dc cortisol No anticoagulation with plat count low  RENAL A:   Acute Renal Failure Metabolic Acidosis-resolved Oliguria P:  Dc bicarb drip Place HD catheter as increased edema, likely will have worsening O2 needs- correct plat best can Chem in afternoon and am   GASTROINTESTINAL A:   P:   Tube feeds today attempt Trend LFT's again in am  ppi oxepa - ards  HEMATOLOGIC A:   Thrombocytopenia - rule out TTP with profound thrombocytopenia and no clear sepsis source.  ? Hx of injecting oxy IV. Kidney / skin / hep c - r/o cryoglobulins P:  Trend CBC in am  Platelet transfusion now then consider placement catheter Haptoglobin 120 Peripheral smear - no schisto will repeat and ensure tech read LDH 809 scd Heme cosnult  INFECTIOUS A:   Sepsis - unclear etiology, concern for endocarditis with ? IV drug use.  Initial CXR in [place] concerning for aspiration PNA Hep C Positive Toxic shock? P:   ID consult CT chest when able Vanc, cefipime for now TEE when plat better Send HIV  ENDOCRINE A: r/o rel AI  P:   CBG's Q4 Cortisol if drops pressure again  NEUROLOGIC A:  Encephalopathic / sedated History of Chronic pain - Pain Clinic in [place] P:   CT head to eval in setting of AMS - destas with movement, make head at least portable Wake up assessment daily Minimize sedation  SKIN R/o toxic  shock R/o ischemic feet from pressors, septic embolic R/o cryoglobulins, pemphigus vulgaris (mouth noted) -r/o TENS -will attempt to derm consult -ensure autoimmune sent -send cryo -treating endocarditis  I have personally obtained a history, examined the patient, evaluated laboratory and imaging results, formulated the assessment and plan and placed orders.  CRITICAL CARE: The patient is critically ill with multiple organ systems failure and requires high complexity decision making for assessment and support, frequent evaluation and titration of therapies, application of advanced monitoring technologies and extensive interpretation of multiple databases. Critical Care Time devoted to patient care services described in this note is 90 minutes.   08/05/2013, 8:04 AM  Mcarthur Rossetti. Tyson Alias, MD, FACP Pgr: [phone] [facility]

## 2013-08-06 ENCOUNTER — Inpatient Hospital Stay (HOSPITAL_COMMUNITY): Payer: BC Managed Care – PPO

## 2013-08-06 DIAGNOSIS — R21 Rash and other nonspecific skin eruption: Secondary | ICD-10-CM

## 2013-08-06 DIAGNOSIS — D72819 Decreased white blood cell count, unspecified: Secondary | ICD-10-CM

## 2013-08-06 LAB — BLOOD GAS, ARTERIAL
Acid-Base Excess: 4 mmol/L — ABNORMAL HIGH (ref 0.0–2.0)
Drawn by: 31276
FIO2: 0.6 %
MECHVT: 470 mL
O2 Saturation: 96.7 %
PEEP: 8 cmH2O
Patient temperature: 98.6
RATE: 10 resp/min
pO2, Arterial: 103 mmHg — ABNORMAL HIGH (ref 80.0–100.0)

## 2013-08-06 LAB — CBC WITH DIFFERENTIAL/PLATELET
Basophils Relative: 0 % (ref 0–1)
Eosinophils Relative: 0 % (ref 0–5)
Hemoglobin: 10.7 g/dL — ABNORMAL LOW (ref 13.0–17.0)
Lymphocytes Relative: 3 % — ABNORMAL LOW (ref 12–46)
Lymphs Abs: 0.4 10*3/uL — ABNORMAL LOW (ref 0.7–4.0)
Monocytes Relative: 5 % (ref 3–12)
Neutrophils Relative %: 92 % — ABNORMAL HIGH (ref 43–77)
RBC: 3.54 MIL/uL — ABNORMAL LOW (ref 4.22–5.81)

## 2013-08-06 LAB — PREPARE PLATELET PHERESIS: Unit division: 0

## 2013-08-06 LAB — COMPREHENSIVE METABOLIC PANEL
BUN: 94 mg/dL — ABNORMAL HIGH (ref 6–23)
CO2: 29 mEq/L (ref 19–32)
Calcium: 6.8 mg/dL — ABNORMAL LOW (ref 8.4–10.5)
Creatinine, Ser: 3.55 mg/dL — ABNORMAL HIGH (ref 0.50–1.35)
GFR calc Af Amer: 21 mL/min — ABNORMAL LOW (ref >90)
GFR calc non Af Amer: 18 mL/min — ABNORMAL LOW (ref >90)
Glucose, Bld: 180 mg/dL — ABNORMAL HIGH (ref 70–99)
Total Protein: 4.5 g/dL — ABNORMAL LOW (ref 6.0–8.3)

## 2013-08-06 LAB — HEPATITIS B CORE ANTIBODY, TOTAL: Hep B Core Total Ab: NONREACTIVE

## 2013-08-06 LAB — GLUCOSE, CAPILLARY
Glucose-Capillary: 169 mg/dL — ABNORMAL HIGH (ref 70–99)
Glucose-Capillary: 133 mg/dL — ABNORMAL HIGH (ref 70–99)
Glucose-Capillary: 163 mg/dL — ABNORMAL HIGH (ref 70–99)

## 2013-08-06 LAB — HEPATITIS B SURFACE ANTIBODY,QUALITATIVE: Hep B S Ab: NEGATIVE

## 2013-08-06 LAB — LACTATE DEHYDROGENASE: LDH: 723 U/L — ABNORMAL HIGH (ref 94–250)

## 2013-08-06 LAB — RETICULOCYTES
RBC.: 3.54 MIL/uL — ABNORMAL LOW (ref 4.22–5.81)
Retic Ct Pct: <0.4 % — ABNORMAL LOW (ref 0.4–3.1)

## 2013-08-06 MED ORDER — VANCOMYCIN HCL IN DEXTROSE 750-5 MG/150ML-% IV SOLN
750.0000 mg | Freq: Once | INTRAVENOUS | Status: AC
Start: 2013-08-06 — End: 2013-08-07
  Administered 2013-08-07: 750 mg via INTRAVENOUS
  Filled 2013-08-06: qty 150

## 2013-08-06 MED ORDER — PENTAFLUOROPROP-TETRAFLUOROETH EX AERO: 1.0000 "application " | INHALATION_SPRAY | CUTANEOUS | Status: DC | PRN

## 2013-08-06 MED ORDER — NEPRO/CARBSTEADY PO LIQD: 237.0000 mL | ORAL | Status: DC | PRN

## 2013-08-06 MED ORDER — ALBUTEROL SULFATE HFA 108 (90 BASE) MCG/ACT IN AERS: 8.0000 | INHALATION_SPRAY | RESPIRATORY_TRACT | Status: DC | PRN

## 2013-08-06 MED ORDER — LIDOCAINE-PRILOCAINE 2.5-2.5 % EX CREA: 1.0000 "application " | TOPICAL_CREAM | CUTANEOUS | Status: DC | PRN

## 2013-08-06 MED ORDER — SODIUM CHLORIDE 0.9 % IV SOLN: 100.0000 mL | INTRAVENOUS | Status: DC | PRN

## 2013-08-06 MED ORDER — LIDOCAINE HCL (PF) 1 % IJ SOLN: 5.0000 mL | INTRAMUSCULAR | Status: DC | PRN

## 2013-08-06 MED ORDER — ALBUTEROL SULFATE HFA 108 (90 BASE) MCG/ACT IN AERS
8.0000 | INHALATION_SPRAY | Freq: Four times a day (QID) | RESPIRATORY_TRACT | Status: DC
  Administered 2013-08-06 – 2013-08-08 (×7): 8 via RESPIRATORY_TRACT
  Filled 2013-08-06: qty 6.7

## 2013-08-06 MED ORDER — HEPARIN SODIUM (PORCINE) 1000 UNIT/ML DIALYSIS: 1000.0000 [IU] | INTRAMUSCULAR | Status: DC | PRN

## 2013-08-06 MED ORDER — ALTEPLASE 2 MG IJ SOLR: 2.0000 mg | Freq: Once | INTRAMUSCULAR | Status: DC | PRN

## 2013-08-06 NOTE — Progress Notes (Signed)
Utilization Review Completed.Timothy Kline T11/11/2012  

## 2013-08-06 NOTE — Progress Notes (Signed)
PULMONARY  / CRITICAL CARE MEDICINE  Name: Timothy Kline MRN: [id number] DOB: [birth year]    ADMISSION DATE:  08/04/2013 CONSULTATION DATE:  10/31  REFERRING MD :  [facility] PRIMARY SERVICE: PCCM  CHIEF COMPLAINT:  Respiratory Failure/ Sepsis/ Renal Failure  BRIEF PATIENT DESCRIPTION: 52 year old male presented to [facility] 10/28 with AMS/Sepsis in setting of cocaine /oxycodone use. Pt admitted with Respiratory Failure (ARDS)/Sepsis has developed acute renal failure, thrombocytopenia, a-fib Flutter with RVR and was transferred to [facility] on 10/31 for HD and further care. CCM asked to admit.  SIGNIFICANT EVENTS / STUDIES:  10/28: Admission to [facility] 10/29: Metabolic Acidosis, pH=6.9 ....................................................................................... 10/31: Transferred to [facility] for HD. 10/31: Cardioversion for Afib/Flutter rate 170's ( Unsuccessful).  Amiodarone Gtt initiated for Afib/Flutter RVR  11/1- new rash concerns, offf pressors this am  11/2 - tolerated HD, off pressors  LINES / TUBES: 10/28: Right IJ CVC>>>11/1 10/29: oral ETT>>> 11/2 L IJ HD Cath>   CULTURES: BCx2 10/31>>> Sputum 10/31>>>  ANTIBIOTICS: Rocephin: 10/28?>>>10/31 Levoquin:   10/28>>>10/31 Diflucan 10/29 Primaxin 10/29 .....................................................Marland Kitchen Vancomycin 10/28>>> Cefipime 10/31>>> Flagyl 11/2 >>  VITAL SIGNS: Temp:  [98.8 F (37.1 C)-99.3 F (37.4 C)] 98.8 F (37.1 C) (11/02 0600) Pulse Rate:  [28-123] 115 (11/02 1152) Resp:  [14-28] 18 (11/02 1152) BP: (97-129)/(67-92) 128/81 mmHg (11/02 1152) SpO2:  [96 %-100 %] 98 % (11/02 1152) FiO2 (%):  [40 %-100 %] 50 % (11/02 1152) Weight:  [106.8 kg (235 lb 7.2 oz)-109.9 kg (242 lb 4.6 oz)] 109.9 kg (242 lb 4.6 oz) (11/02 0358)  HEMODYNAMICS:    VENTILATOR SETTINGS: Vent Mode:  [-] PRVC FiO2 (%):  [40 %-100 %] 50 % Set Rate:  [10 bmp] 10 bmp Vt Set:  [470 mL] 470 mL PEEP:  [8  cmH20] 8 cmH20  INTAKE / OUTPUT: Intake/Output     11/01 0701 - 11/02 0700 11/02 0701 - 11/03 0700   I.V. (mL/kg) 2260.1 (20.6)    Blood 312    Other     NG/GT 371.5    IV Piggyback 350    Total Intake(mL/kg) 3293.6 (30)    Urine (mL/kg/hr) 221 (0.1)    Other 2882 (1.1)    Total Output 3103     Net +190.6           PHYSICAL EXAMINATION: General: sedated on vent HEENT: NCAT, ETT in place PULM: Wheezing bilaterally CV: RRR, no mgr Ab: BS infrequent, soft Derm: rash> purpuric on feet/hands, blistering on arms Neuro: sedated on vent  LABS:  CBC Recent Labs     08/05/13  0630  08/05/13  2030  08/06/13  0419  WBC  15.8*  15.1*  13.4*  HGB  11.5*  11.0*  10.7*  HCT  31.8*  30.8*  29.8*  PLT  11*  26*  18*   Coag's Recent Labs     08/04/13  2000  08/05/13  0500  08/06/13  0419  APTT  35   --    --   INR  1.77*  1.78*  1.55*   BMET Recent Labs     08/04/13  1622  08/05/13  0500  08/06/13  0419  NA  141  140  138  K  3.3*  4.3  4.1  CL  92*  92*  95*  CO2  33*  33*  29  BUN  77*  91*  94*  CREATININE  3.08*  3.72*  3.55*  GLUCOSE  156*  134*  180*   Electrolytes Recent Labs     08/04/13  1622  08/05/13  0500  08/06/13  0419  CALCIUM  5.6*  6.3*  6.8*  MG  1.7  1.9   --   PHOS  5.0*  5.3*   --    Sepsis Markers No results found for this basename: LACTICACIDVEN, PROCALCITON, O2SATVEN,  in the last 72 hours ABG Recent Labs     08/04/13  1710  08/05/13  0411  08/06/13  0401  PHART  7.458*  7.485*  7.377  PCO2ART  46.9*  47.7*  50.2*  PO2ART  119.0*  83.0  103.0*   Liver Enzymes Recent Labs     08/04/13  2000  08/05/13  0500  08/06/13  0419  AST  78*  96*  67*  ALT  17  18  15   ALKPHOS  116  110  86  BILITOT  5.0*  5.7*  5.3*  ALBUMIN  1.5*  1.5*  1.5*   Cardiac Enzymes Recent Labs     08/04/13  2350  TROPONINI  <0.30   Glucose Recent Labs     08/05/13  1933  08/05/13  1935  08/05/13  2359  08/06/13  0411  08/06/13  0726   08/06/13  1120  GLUCAP  <10*  130*  142*  169*  133*  113*    Imaging Dg Chest Port 1 View  08/06/2013   CLINICAL DATA:  Endotracheal tube placement.  EXAM: PORTABLE CHEST - 1 VIEW  COMPARISON:  Chest radiograph August 05, 2013  FINDINGS: Endotracheal tube tip projects 3.1 cm above the equina. Right internal jugular central venous catheter with distal tip projecting in proximal superior vena cava. Nasogastric tube appears looped in proximal stomach, distal tip not imaged at least past the proximal duodenum.  The cardiac silhouette appears at least mildly numb to moderately enlarged, even with consideration to this low inspiratory portable examination with crowded vasculature markings. Interstitial prominence with decreasing patchy alveolar airspace opacities. Improved, small residual pleural effusions. No pneumothorax. Multiple EKG lines overlie the patient and may obscure subtle underlying pathology. Warming blanket overlies the patient.  IMPRESSION: Endotracheal tube tip now projects 3.1 cm above the carinal, no apparent change in remaining life support lines.  Stable cardiomegaly with interstitial and decreasing alveolar airspace opacities favoring confluent edema and/ or pneumonia with decreased, minimal residual pleural effusions.   Electronically Signed   By: Awilda Metro   On: 08/06/2013 04:55   Dg Chest Port 1 View  08/05/2013   CLINICAL DATA:  Evaluate endotracheal tube.  EXAM: PORTABLE CHEST - 1 VIEW  COMPARISON:  08/04/2013  FINDINGS: Endotracheal tube is 7.7 cm above the carina. There are cardiac pads on the chest. Right jugular central venous catheter in the SVC region. Increased densities in the right lower lung are suggestive for increased edema or pleural fluid. Heart size is stable. Nasogastric tube extends into the abdomen.  IMPRESSION: There are increased densities in the right lower chest. Findings may represent worsening edema and/or pleural fluid.  Support apparatuses as  described.   Electronically Signed   By: Richarda Overlie M.D.   On: 08/05/2013 07:33   Dg Chest Port 1 View  08/04/2013   CLINICAL DATA:  Check endotracheal tube  EXAM: PORTABLE CHEST - 1 VIEW  COMPARISON:  08/04/2013 1536 hrs  FINDINGS: The cardiac shadow is stable. Patchy infiltrative changes are again identified bilaterally. A right-sided central venous line, nasogastric catheter and endotracheal tube  are again seen and stable. The endotracheal tube again lies approximately 6.4 cm above the carinal. No other focal abnormality is seen.  IMPRESSION: Stable appearance of tubes and lines as described.  Patchy infiltrates bilaterally stable from the previous film   Electronically Signed   By: Alcide Clever M.D.   On: 08/04/2013 17:56   Dg Chest Port 1 View  08/04/2013   CLINICAL DATA:  Respiratory difficulty  EXAM: PORTABLE CHEST - 1 VIEW  COMPARISON:  08/04/2013  FINDINGS: Endotracheal tube, NG tube, right internal jugular central venous catheter are stable. The endotracheal tube remains 6.4 cm from the carinal. Extensive patchy bilateral airspace disease is not significantly changed. No pneumothorax.  IMPRESSION: Stable bilateral airspace disease.   Electronically Signed   By: Maryclare Bean M.D.   On: 08/04/2013 15:45    ASSESSMENT / PLAN:  PULMONARY A:  Acute Respiratory Failure ARDS Some volume overload Wheezing/suspect COPD P:   ARDS protocol, keep plat less 30 Continue HD for volume removal Add scheduled and prn bronchodilators   CARDIOVASCULAR A:  Hypotension 2/2 sepsis - unclear etiology, suspicion for endocarditis but ECHO pending - reslvined pressor needs A-fib/Flutter with RVR - s/p cardioversion 10/31 without success CHF (due to sepsis?)> 11/1 TTE> LVEF 20-25%, diffuse hypokinesis; PASP , RV dilated, reduced P:  Amio drip > maintain due arrhythmia issues, convert 11/3? Tele Will need repeat Echo at some point Consider TEE for endocarditis 11/3 given IVDU  RENAL A:   Acute  Renal Failure > 11/1 HD start Metabolic Acidosis-resolved Oliguria P:  HD per renal  GASTROINTESTINAL A:   P:   Tube feeds continue Trend LFT's again in am  ppi oxepa   HEMATOLOGIC A:   Thrombocytopenia - Hematology feels most like related to sepsis, not clearly HUS so holding plasmapheresis;  ? Hx of injecting oxy IV.Kidney / skin / hep c - r/o cryoglobulins P:  Trend CBC in am  Platelet transfusion now, goal PLT > 20K scd Appreciate hematology Supportive care for now  INFECTIOUS A:   Sepsis - unclear etiology, concern for endocarditis with ? IV drug use.  Initial CXR in [place] concerning for aspiration PNA Hep C Positive Toxic shock? P:   Consider TEE CT chest when able Vanc, cefipime, flagyl per ID   ENDOCRINE A: r/o rel AI  P:   CBG's Q4 Cortisol if drops pressure again  NEUROLOGIC A:  Encephalopathic / sedated History of Chronic pain - Pain Clinic in [place] P:   Wake up assessment daily Minimize sedation  DERM: F/u derm biopsy results   I have personally obtained a history, examined the patient, evaluated laboratory and imaging results, formulated the assessment and plan and placed orders.  CRITICAL CARE: The patient is critically ill with multiple organ systems failure and requires high complexity decision making for assessment and support, frequent evaluation and titration of therapies, application of advanced monitoring technologies and extensive interpretation of multiple databases. Critical Care Time devoted to patient care services described in this note is 50 minutes.   08/06/2013, 1:00 PM  Yolonda Kida PCCM Pager: [phone] Cell: [phone] If no response, call [phone]

## 2013-08-06 NOTE — Progress Notes (Signed)
Assessment:  1 Thrombocytopenia; sepsis vs TTP 2 Oliguric ARF  3 VDRF  4 Polysubstance abuse  5 Hep C ab Pos  6 Shock syndrome  7 Skin sloughing/acral cyanosis  Plan:  1 supportive therapy, with plans for dialysis again for volume removal for pulmonary edema   Subjective: Interval History: 2.8 liters off with HD  Objective: Vital signs in last 24 hours: Temp:  [98.8 F (37.1 C)-99.9 F (37.7 C)] 98.8 F (37.1 C) (11/02 0600) Pulse Rate:  [28-123] 103 (11/02 0906) Resp:  [14-28] 14 (11/02 0906) BP: (97-129)/(66-92) 126/73 mmHg (11/02 0906) SpO2:  [96 %-100 %] 100 % (11/02 0906) FiO2 (%):  [40 %-100 %] 50 % (11/02 0906) Weight:  [106.8 kg (235 lb 7.2 oz)-109.9 kg (242 lb 4.6 oz)] 109.9 kg (242 lb 4.6 oz) (11/02 0358) Weight change: 3 kg (6 lb 9.8 oz)  Intake/Output from previous day: 11/01 0701 - 11/02 0700 In: 3293.6 [I.V.:2260.1; Blood:312; NG/GT:371.5; IV Piggyback:350] Out: 3103 [Urine:221] Intake/Output this shift:    General appearance: sedated and unresponsive Resp: clear to auscultation bilaterally Chest wall: no tenderness Cardio: irregularly irregular rhythm Extremities: edema 1-2+  and acral demarcation of feet and some blistering of ues ischemic tissue  Lab Results:  Recent Labs  08/05/13 2030 08/06/13 0419  WBC 15.1* 13.4*  HGB 11.0* 10.7*  HCT 30.8* 29.8*  PLT 26* 18*   BMET:  Recent Labs  08/05/13 0500 08/06/13 0419  NA 140 138  K 4.3 4.1  CL 92* 95*  CO2 33* 29  GLUCOSE 134* 180*  BUN 91* 94*  CREATININE 3.72* 3.55*  CALCIUM 6.3* 6.8*   No results found for this basename: PTH,  in the last 72 hours Iron Studies: No results found for this basename: IRON, TIBC, TRANSFERRIN, FERRITIN,  in the last 72 hours Studies/Results: Dg Chest Port 1 View  08/06/2013   CLINICAL DATA:  Endotracheal tube placement.  EXAM: PORTABLE CHEST - 1 VIEW  COMPARISON:  Chest radiograph August 05, 2013  FINDINGS: Endotracheal tube tip projects 3.1 cm above the  equina. Right internal jugular central venous catheter with distal tip projecting in proximal superior vena cava. Nasogastric tube appears looped in proximal stomach, distal tip not imaged at least past the proximal duodenum.  The cardiac silhouette appears at least mildly numb to moderately enlarged, even with consideration to this low inspiratory portable examination with crowded vasculature markings. Interstitial prominence with decreasing patchy alveolar airspace opacities. Improved, small residual pleural effusions. No pneumothorax. Multiple EKG lines overlie the patient and may obscure subtle underlying pathology. Warming blanket overlies the patient.  IMPRESSION: Endotracheal tube tip now projects 3.1 cm above the carinal, no apparent change in remaining life support lines.  Stable cardiomegaly with interstitial and decreasing alveolar airspace opacities favoring confluent edema and/ or pneumonia with decreased, minimal residual pleural effusions.   Electronically Signed   By: Awilda Metro   On: 08/06/2013 04:55   Dg Chest Port 1 View  08/05/2013   CLINICAL DATA:  Evaluate endotracheal tube.  EXAM: PORTABLE CHEST - 1 VIEW  COMPARISON:  08/04/2013  FINDINGS: Endotracheal tube is 7.7 cm above the carina. There are cardiac pads on the chest. Right jugular central venous catheter in the SVC region. Increased densities in the right lower lung are suggestive for increased edema or pleural fluid. Heart size is stable. Nasogastric tube extends into the abdomen.  IMPRESSION: There are increased densities in the right lower chest. Findings may represent worsening edema and/or pleural fluid.  Support apparatuses as described.   Electronically Signed   By: Richarda Overlie M.D.   On: 08/05/2013 07:33   Dg Chest Port 1 View  08/04/2013   CLINICAL DATA:  Check endotracheal tube  EXAM: PORTABLE CHEST - 1 VIEW  COMPARISON:  08/04/2013 1536 hrs  FINDINGS: The cardiac shadow is stable. Patchy infiltrative changes are  again identified bilaterally. A right-sided central venous line, nasogastric catheter and endotracheal tube are again seen and stable. The endotracheal tube again lies approximately 6.4 cm above the carinal. No other focal abnormality is seen.  IMPRESSION: Stable appearance of tubes and lines as described.  Patchy infiltrates bilaterally stable from the previous film   Electronically Signed   By: Alcide Clever M.D.   On: 08/04/2013 17:56   Dg Chest Port 1 View  08/04/2013   CLINICAL DATA:  Respiratory difficulty  EXAM: PORTABLE CHEST - 1 VIEW  COMPARISON:  08/04/2013  FINDINGS: Endotracheal tube, NG tube, right internal jugular central venous catheter are stable. The endotracheal tube remains 6.4 cm from the carinal. Extensive patchy bilateral airspace disease is not significantly changed. No pneumothorax.  IMPRESSION: Stable bilateral airspace disease.   Electronically Signed   By: Maryclare Bean M.D.   On: 08/04/2013 15:45    Scheduled: . antiseptic oral rinse  15 mL Mouth Rinse QID  . ceFEPime (MAXIPIME) IV  1 g Intravenous Q24H  . chlorhexidine  15 mL Mouth Rinse BID  . feeding supplement (OXEPA)  1,000 mL Per Tube Q24H  . feeding supplement (PRO-STAT SUGAR FREE 64)  60 mL Per Tube TID  . methylPREDNISolone (SOLU-MEDROL) injection  60 mg Intravenous Q8H  . metronidazole  500 mg Intravenous Q8H  . pantoprazole (PROTONIX) IV  40 mg Intravenous QHS       LOS: 2 days   Marlo Arriola C 08/06/2013,9:27 AM

## 2013-08-06 NOTE — Progress Notes (Addendum)
Subjective: The patient is sedated and intubated. No significant changes overnight per nurse's report. He is currently off pressors. No significant bleeding issues but a lot of skin breaks and oozing from the uppper extremities.  Objective: Vital signs in last 24 hours: Temp:  [98.8 F (37.1 C)-99.4 F (37.4 C)] 98.8 F (37.1 C) (11/02 0600) Pulse Rate:  [28-123] 115 (11/02 1152) Resp:  [14-28] 18 (11/02 1152) BP: (97-129)/(67-92) 128/81 mmHg (11/02 1152) SpO2:  [96 %-100 %] 98 % (11/02 1152) FiO2 (%):  [40 %-100 %] 50 % (11/02 1152) Weight:  [235 lb 7.2 oz (106.8 kg)-242 lb 4.6 oz (109.9 kg)] 242 lb 4.6 oz (109.9 kg) (11/02 0358)  Intake/Output from previous day: 11/01 0701 - 11/02 0700 In: 3293.6 [I.V.:2260.1; Blood:312; NG/GT:371.5; IV Piggyback:350] Out: 3103 [Urine:221] Intake/Output this shift:    General appearance: sedated and intubated Resp: clear to auscultation bilaterally Cardio: regular rate and rhythm, S1, S2 normal, no murmur, click, rub or gallop GI: soft, non-tender; bowel sounds normal; no masses,  no organomegaly Extremities: edema 1+, Purpulish discoloration of feet.  Lab Results:   Recent Labs  08/05/13 2030 08/06/13 0419  WBC 15.1* 13.4*  HGB 11.0* 10.7*  HCT 30.8* 29.8*  PLT 26* 18*   BMET  Recent Labs  08/05/13 0500 08/06/13 0419  NA 140 138  K 4.3 4.1  CL 92* 95*  CO2 33* 29  GLUCOSE 134* 180*  BUN 91* 94*  CREATININE 3.72* 3.55*  CALCIUM 6.3* 6.8*    Studies/Results: Dg Chest Port 1 View  08/06/2013   CLINICAL DATA:  Endotracheal tube placement.  EXAM: PORTABLE CHEST - 1 VIEW  COMPARISON:  Chest radiograph August 05, 2013  FINDINGS: Endotracheal tube tip projects 3.1 cm above the equina. Right internal jugular central venous catheter with distal tip projecting in proximal superior vena cava. Nasogastric tube appears looped in proximal stomach, distal tip not imaged at least past the proximal duodenum.  The cardiac silhouette appears  at least mildly numb to moderately enlarged, even with consideration to this low inspiratory portable examination with crowded vasculature markings. Interstitial prominence with decreasing patchy alveolar airspace opacities. Improved, small residual pleural effusions. No pneumothorax. Multiple EKG lines overlie the patient and may obscure subtle underlying pathology. Warming blanket overlies the patient.  IMPRESSION: Endotracheal tube tip now projects 3.1 cm above the carinal, no apparent change in remaining life support lines.  Stable cardiomegaly with interstitial and decreasing alveolar airspace opacities favoring confluent edema and/ or pneumonia with decreased, minimal residual pleural effusions.   Electronically Signed   By: Awilda Metro   On: 08/06/2013 04:55   Dg Chest Port 1 View  08/05/2013   CLINICAL DATA:  Evaluate endotracheal tube.  EXAM: PORTABLE CHEST - 1 VIEW  COMPARISON:  08/04/2013  FINDINGS: Endotracheal tube is 7.7 cm above the carina. There are cardiac pads on the chest. Right jugular central venous catheter in the SVC region. Increased densities in the right lower lung are suggestive for increased edema or pleural fluid. Heart size is stable. Nasogastric tube extends into the abdomen.  IMPRESSION: There are increased densities in the right lower chest. Findings may represent worsening edema and/or pleural fluid.  Support apparatuses as described.   Electronically Signed   By: Richarda Overlie M.D.   On: 08/05/2013 07:33   Dg Chest Port 1 View  08/04/2013   CLINICAL DATA:  Check endotracheal tube  EXAM: PORTABLE CHEST - 1 VIEW  COMPARISON:  08/04/2013 1536 hrs  FINDINGS: The  cardiac shadow is stable. Patchy infiltrative changes are again identified bilaterally. A right-sided central venous line, nasogastric catheter and endotracheal tube are again seen and stable. The endotracheal tube again lies approximately 6.4 cm above the carinal. No other focal abnormality is seen.  IMPRESSION:  Stable appearance of tubes and lines as described.  Patchy infiltrates bilaterally stable from the previous film   Electronically Signed   By: Alcide Clever M.D.   On: 08/04/2013 17:56   Dg Chest Port 1 View  08/04/2013   CLINICAL DATA:  Respiratory difficulty  EXAM: PORTABLE CHEST - 1 VIEW  COMPARISON:  08/04/2013  FINDINGS: Endotracheal tube, NG tube, right internal jugular central venous catheter are stable. The endotracheal tube remains 6.4 cm from the carinal. Extensive patchy bilateral airspace disease is not significantly changed. No pneumothorax.  IMPRESSION: Stable bilateral airspace disease.   Electronically Signed   By: Maryclare Bean M.D.   On: 08/04/2013 15:45    Medications: I have reviewed the patient's current medications.  Assessment/Plan: No significant changes. I agree with our previous recommendations;  #1 severe thrombocytopenia: Most likely secondary to sepsis and DIC. His platelet count improves with transfusion. No clear signs for TTP I would recommend keeping his platelet count above 20,000, if possible, due to coagulopathy from liver failure. Platelet count is 18,000 today. Consider one unit of platelet transfusion today.  #2 leukocytosis with septic shock  Continue broad-spectrum intravenous antibiotics per ID recommendation.   #3 coagulopathy:  This is likely due to sepsis, with possible liver disease. check INR and keep INR under 2 if possible. If the patient has more active bleeding, consider giving fresh frozen plasma.   #4 history of hepatitis C   Hepatitis B panel is negative. HIV test is non-reactive.    LOS: 2 days    Raylinn Kosar K. 08/06/2013

## 2013-08-06 NOTE — Progress Notes (Signed)
Spoke with CT scan about the fact this patient cannot lay flat without desaturating. Pt. therefore is unable to have CT head done until more stable.Will have staff speak with Dr. Tyson Alias tomorrow, 11/3, to determine if we should d/c scan until he is able to tolerate flat positioning.For HD tonight to remove more fluid, so perhaps pt. Will tolerate being flat soon.

## 2013-08-06 NOTE — Progress Notes (Signed)
[facility]  Date of Admission:  08/04/2013  Antibiotics: Antibiotics Given (last 72 hours)   Date/Time Action Medication Dose Rate   08/04/13 1717 Given   ceFEPIme (MAXIPIME) 2 g in dextrose 5 % 50 mL IVPB 2 g 100 mL/hr   08/05/13 1145 Given   metroNIDAZOLE (FLAGYL) IVPB 500 mg 500 mg 100 mL/hr   08/05/13 2017 Given   metroNIDAZOLE (FLAGYL) IVPB 500 mg 500 mg 100 mL/hr   08/05/13 2243 Given   ceFEPIme (MAXIPIME) 1 g in dextrose 5 % 50 mL IVPB 1 g 100 mL/hr   08/06/13 0351 Given   metroNIDAZOLE (FLAGYL) IVPB 500 mg 500 mg 100 mL/hr      Subjective: Remains intubated, pressors ordered, episodes of bradycardia  Objective: Temp:  [98.8 F (37.1 C)-99.9 F (37.7 C)] 98.8 F (37.1 C) (11/02 0600) Pulse Rate:  [28-123] 103 (11/02 0906) Resp:  [14-28] 14 (11/02 0906) BP: (97-129)/(66-92) 126/73 mmHg (11/02 0906) SpO2:  [96 %-100 %] 100 % (11/02 0906) FiO2 (%):  [40 %-100 %] 50 % (11/02 0906) Weight:  [235 lb 7.2 oz (106.8 kg)-242 lb 4.6 oz (109.9 kg)] 242 lb 4.6 oz (109.9 kg) (11/02 0358)  General: intubated, sedated Skin: no changes with rash/purpura Lungs: diffuse rhonchi Cor: tachy rr Abdomen: obese, nt, nd Ext: + edema  Lab Results Lab Results  Component Value Date   WBC 13.4* 08/06/2013   HGB 10.7* 08/06/2013   HCT 29.8* 08/06/2013   MCV 84.2 08/06/2013   PLT 18* 08/06/2013    Lab Results  Component Value Date   CREATININE 3.55* 08/06/2013   BUN 94* 08/06/2013   NA 138 08/06/2013   K 4.1 08/06/2013   CL 95* 08/06/2013   CO2 29 08/06/2013    Lab Results  Component Value Date   ALT 15 08/06/2013   AST 67* 08/06/2013   ALKPHOS 86 08/06/2013   BILITOT 5.3* 08/06/2013      Microbiology: Recent Results (from the past 240 hour(s))  MRSA PCR SCREENING     Status: None   Collection Time    08/04/13  3:21 PM      Result Value Range Status   MRSA by PCR NEGATIVE  NEGATIVE Final   Comment:            The GeneXpert MRSA Assay (FDA   approved for NASAL specimens     only), is one component of a     comprehensive MRSA colonization     surveillance program. It is not     intended to diagnose MRSA     infection nor to guide or     monitor treatment for     MRSA infections.  CULTURE, RESPIRATORY (NON-EXPECTORATED)     Status: None   Collection Time    08/04/13  8:27 PM      Result Value Range Status   Specimen Description TRACHEAL ASPIRATE   Final   Special Requests NONE   Final   Gram Stain     Final   Value: FEW WBC PRESENT,BOTH PMN AND MONONUCLEAR     NO SQUAMOUS EPITHELIAL CELLS SEEN     NO ORGANISMS SEEN     Performed at [facility]   Culture PENDING   Incomplete   Report Status PENDING   Incomplete    Studies/Results: Dg Chest Port 1 View  08/06/2013   CLINICAL DATA:  Endotracheal tube placement.  EXAM: PORTABLE CHEST - 1 VIEW  COMPARISON:  Chest  radiograph August 05, 2013  FINDINGS: Endotracheal tube tip projects 3.1 cm above the equina. Right internal jugular central venous catheter with distal tip projecting in proximal superior vena cava. Nasogastric tube appears looped in proximal stomach, distal tip not imaged at least past the proximal duodenum.  The cardiac silhouette appears at least mildly numb to moderately enlarged, even with consideration to this low inspiratory portable examination with crowded vasculature markings. Interstitial prominence with decreasing patchy alveolar airspace opacities. Improved, small residual pleural effusions. No pneumothorax. Multiple EKG lines overlie the patient and may obscure subtle underlying pathology. Warming blanket overlies the patient.  IMPRESSION: Endotracheal tube tip now projects 3.1 cm above the carinal, no apparent change in remaining life support lines.  Stable cardiomegaly with interstitial and decreasing alveolar airspace opacities favoring confluent edema and/ or pneumonia with decreased, minimal residual pleural effusions.   Electronically Signed    By: Awilda Metro   On: 08/06/2013 04:55   Dg Chest Port 1 View  08/05/2013   CLINICAL DATA:  Evaluate endotracheal tube.  EXAM: PORTABLE CHEST - 1 VIEW  COMPARISON:  08/04/2013  FINDINGS: Endotracheal tube is 7.7 cm above the carina. There are cardiac pads on the chest. Right jugular central venous catheter in the SVC region. Increased densities in the right lower lung are suggestive for increased edema or pleural fluid. Heart size is stable. Nasogastric tube extends into the abdomen.  IMPRESSION: There are increased densities in the right lower chest. Findings may represent worsening edema and/or pleural fluid.  Support apparatuses as described.   Electronically Signed   By: Richarda Overlie M.D.   On: 08/05/2013 07:33   Dg Chest Port 1 View  08/04/2013   CLINICAL DATA:  Check endotracheal tube  EXAM: PORTABLE CHEST - 1 VIEW  COMPARISON:  08/04/2013 1536 hrs  FINDINGS: The cardiac shadow is stable. Patchy infiltrative changes are again identified bilaterally. A right-sided central venous line, nasogastric catheter and endotracheal tube are again seen and stable. The endotracheal tube again lies approximately 6.4 cm above the carinal. No other focal abnormality is seen.  IMPRESSION: Stable appearance of tubes and lines as described.  Patchy infiltrates bilaterally stable from the previous film   Electronically Signed   By: Alcide Clever M.D.   On: 08/04/2013 17:56   Dg Chest Port 1 View  08/04/2013   CLINICAL DATA:  Respiratory difficulty  EXAM: PORTABLE CHEST - 1 VIEW  COMPARISON:  08/04/2013  FINDINGS: Endotracheal tube, NG tube, right internal jugular central venous catheter are stable. The endotracheal tube remains 6.4 cm from the carinal. Extensive patchy bilateral airspace disease is not significantly changed. No pneumothorax.  IMPRESSION: Stable bilateral airspace disease.   Electronically Signed   By: Maryclare Bean M.D.   On: 08/04/2013 15:45    Assessment/Plan: 1) septic shock - cultures remain  negative.  On cefepime, vanco and flagyl ordered.  TTE with diffuse hypokinesis, no endocarditis noted.   - continue with broad spectrum antibiotics.   2)  Rash - stable, cryoglobulin, vasculitis; biopsy sent by dermatology, labs pending.  Hematology following and DIC possible.    Staci Righter, MD [email or web address] [phone] pager   [phone] cell 08/06/2013, 9:57 AM

## 2013-08-06 NOTE — Progress Notes (Signed)
All pulese doppled multiple times today. Radial pulses, and pedal and post tibial. All pulses are present despite mottling of hands and feet.

## 2013-08-07 ENCOUNTER — Inpatient Hospital Stay (HOSPITAL_COMMUNITY): Payer: BC Managed Care – PPO

## 2013-08-07 DIAGNOSIS — A419 Sepsis, unspecified organism: Secondary | ICD-10-CM

## 2013-08-07 DIAGNOSIS — D692 Other nonthrombocytopenic purpura: Secondary | ICD-10-CM

## 2013-08-07 DIAGNOSIS — J189 Pneumonia, unspecified organism: Secondary | ICD-10-CM

## 2013-08-07 DIAGNOSIS — D65 Disseminated intravascular coagulation [defibrination syndrome]: Secondary | ICD-10-CM

## 2013-08-07 LAB — GLUCOSE, CAPILLARY
Glucose-Capillary: <10 mg/dL — CL (ref 70–99)
Glucose-Capillary: <10 mg/dL — CL (ref 70–99)
Glucose-Capillary: 176 mg/dL — ABNORMAL HIGH (ref 70–99)
Glucose-Capillary: 66 mg/dL — ABNORMAL LOW (ref 70–99)
Glucose-Capillary: 155 mg/dL — ABNORMAL HIGH (ref 70–99)
Glucose-Capillary: 175 mg/dL — ABNORMAL HIGH (ref 70–99)
Glucose-Capillary: 152 mg/dL — ABNORMAL HIGH (ref 70–99)
Glucose-Capillary: 179 mg/dL — ABNORMAL HIGH (ref 70–99)

## 2013-08-07 LAB — CBC WITH DIFFERENTIAL/PLATELET
Basophils Relative: 1 % (ref 0–1)
Eosinophils Absolute: 0 10*3/uL (ref 0.0–0.7)
Eosinophils Relative: 0 % (ref 0–5)
HCT: 29.9 % — ABNORMAL LOW (ref 39.0–52.0)
Hemoglobin: 10.8 g/dL — ABNORMAL LOW (ref 13.0–17.0)
Lymphocytes Relative: 4 % — ABNORMAL LOW (ref 12–46)
MCH: 30.3 pg (ref 26.0–34.0)
MCHC: 36.1 g/dL — ABNORMAL HIGH (ref 30.0–36.0)
Neutro Abs: 13.3 10*3/uL — ABNORMAL HIGH (ref 1.7–7.7)
Platelets: 20 10*3/uL — CL (ref 150–400)
RBC: 3.56 MIL/uL — ABNORMAL LOW (ref 4.22–5.81)
WBC: 14.9 10*3/uL — ABNORMAL HIGH (ref 4.0–10.5)

## 2013-08-07 LAB — BASIC METABOLIC PANEL
BUN: 112 mg/dL — ABNORMAL HIGH (ref 6–23)
Chloride: 99 mEq/L (ref 96–112)
GFR calc non Af Amer: 16 mL/min — ABNORMAL LOW (ref >90)
Glucose, Bld: 191 mg/dL — ABNORMAL HIGH (ref 70–99)
Potassium: 4.4 mEq/L (ref 3.5–5.1)

## 2013-08-07 LAB — BLOOD GAS, ARTERIAL
Acid-base deficit: 0.5 mmol/L (ref 0.0–2.0)
Drawn by: 33234
FIO2: 0.5 %
MECHVT: 540 mL
O2 Saturation: 95.9 %
Patient temperature: 98.6
RATE: 14 resp/min
pCO2 arterial: 60.9 mmHg (ref 35.0–45.0)

## 2013-08-07 LAB — CORTISOL: Cortisol, Plasma: 60.1 ug/dL

## 2013-08-07 LAB — PREPARE PLATELET PHERESIS

## 2013-08-07 LAB — CULTURE, RESPIRATORY W GRAM STAIN

## 2013-08-07 MED ORDER — DEXTROSE 50 % IV SOLN: 25.0000 mL | Freq: Once | INTRAVENOUS | Status: AC | PRN

## 2013-08-07 MED ORDER — SODIUM CHLORIDE 0.9 % IV SOLN
500.0000 mg | INTRAVENOUS | Status: DC
  Administered 2013-08-07 – 2013-08-10 (×4): 500 mg via INTRAVENOUS
  Filled 2013-08-07 (×5): qty 0.5

## 2013-08-07 MED ORDER — METOPROLOL TARTRATE 1 MG/ML IV SOLN
5.0000 mg | Freq: Four times a day (QID) | INTRAVENOUS | Status: DC
  Administered 2013-08-07 – 2013-08-08 (×4): 5 mg via INTRAVENOUS
  Filled 2013-08-07 (×6): qty 5

## 2013-08-07 MED ORDER — METOPROLOL TARTRATE 1 MG/ML IV SOLN
INTRAVENOUS | Status: AC
  Administered 2013-08-07: 5 mg via INTRAVENOUS
  Filled 2013-08-07: qty 5

## 2013-08-07 MED ORDER — DEXTROSE 50 % IV SOLN
INTRAVENOUS | Status: AC
  Administered 2013-08-07: 25 mL
  Filled 2013-08-07: qty 50

## 2013-08-07 MED ORDER — AMIODARONE IV BOLUS ONLY 150 MG/100ML
150.0000 mg | Freq: Once | INTRAVENOUS | Status: AC
  Administered 2013-08-07: 150 mg via INTRAVENOUS

## 2013-08-07 MED FILL — Heparin Sodium (Porcine) 100 Unt/ML in Sodium Chloride 0.45%: INTRAMUSCULAR | Qty: 250 | Status: AC

## 2013-08-07 NOTE — Progress Notes (Signed)
Biopsy from right forearm done 08/05/2013 showed showed thrombotic vasculopathic reaction suggestive of DIC like reaction.  Given clinical presentation I would expect that sepsis was the inciting cause.  Agree with current plan.  Please let me know if we can be of further assistance.

## 2013-08-07 NOTE — Procedures (Signed)
Pt seen on HD  Vp 160  Ap 110.  HR 120-130.  SBO 118.  Tolerating HD well so far.

## 2013-08-07 NOTE — Progress Notes (Signed)
    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

## 2013-08-07 NOTE — Progress Notes (Signed)
[facility]  Dat # 4 cefepime Day # 4 Vancomycin Day #4 metronidazole  Subjective: Intubated sedated   Antibiotics:  Anti-infectives   Start     Dose/Rate Route Frequency Ordered Stop   08/06/13 2000  vancomycin (VANCOCIN) IVPB 750 mg/150 ml premix     750 mg 150 mL/hr over 60 Minutes Intravenous  Once 08/06/13 1056 08/07/13 0330   08/05/13 2200  ceFEPIme (MAXIPIME) 1 g in dextrose 5 % 50 mL IVPB     1 g 100 mL/hr over 30 Minutes Intravenous Every 24 hours 08/04/13 1815     08/05/13 1200  metroNIDAZOLE (FLAGYL) IVPB 500 mg     500 mg 100 mL/hr over 60 Minutes Intravenous Every 8 hours 08/05/13 1049     08/04/13 1630  ceFEPIme (MAXIPIME) 2 g in dextrose 5 % 50 mL IVPB     2 g 100 mL/hr over 30 Minutes Intravenous  Once 08/04/13 1604 08/04/13 1747      Medications: Scheduled Meds: . albuterol  8 puff Inhalation Q6H  . amiodarone  150 mg Intravenous Once  . antiseptic oral rinse  15 mL Mouth Rinse QID  . ceFEPime (MAXIPIME) IV  1 g Intravenous Q24H  . chlorhexidine  15 mL Mouth Rinse BID  . feeding supplement (OXEPA)  1,000 mL Per Tube Q24H  . feeding supplement (PRO-STAT SUGAR FREE 64)  60 mL Per Tube TID  . methylPREDNISolone (SOLU-MEDROL) injection  60 mg Intravenous Q8H  . metronidazole  500 mg Intravenous Q8H  . pantoprazole (PROTONIX) IV  40 mg Intravenous QHS   Continuous Infusions: . amiodarone (NEXTERONE PREMIX) 360 mg/200 mL dextrose 30 mg/hr (08/06/13 2350)  . fentaNYL infusion INTRAVENOUS 200 mcg/hr (08/07/13 1123)  . midazolam (VERSED) infusion 8 mg/hr (08/07/13 0230)  . norepinephrine (LEVOPHED) Adult infusion     PRN Meds:.sodium chloride, sodium chloride, sodium chloride, sodium chloride, sodium chloride, acetaminophen, albuterol, anticoagulant sodium citrate, dextrose, feeding supplement (NEPRO CARB STEADY), fentaNYL, heparin, influenza vac split quadrivalent PF, lidocaine (PF), lidocaine-prilocaine, midazolam,  pentafluoroprop-tetrafluoroeth, pneumococcal 23 valent vaccine   Objective: Weight change: -3 lb 8.4 oz (-1.6 kg)  Intake/Output Summary (Last 24 hours) at 08/07/13 1335 Last data filed at 08/07/13 1127  Gross per 24 hour  Intake 3236.83 ml  Output   2035 ml  Net 1201.83 ml   Blood pressure 121/84, pulse 131, temperature 100.7 F (38.2 C), temperature source Core (Comment), resp. rate 30, weight 238 lb 1.6 oz (108 kg), SpO2 97.00%. Temp:  [99.4 F (37.4 C)-101.1 F (38.4 C)] 100.7 F (38.2 C) (11/03 1100) Pulse Rate:  [25-146] 131 (11/03 1316) Resp:  [15-30] 30 (11/03 1316) BP: (110-149)/(69-107) 121/84 mmHg (11/03 1100) SpO2:  [95 %-100 %] 97 % (11/03 1316) FiO2 (%):  [40 %-50 %] 50 % (11/03 1316) Weight:  [238 lb 1.6 oz (108 kg)] 238 lb 1.6 oz (108 kg) (11/03 0433)  Physical Exam: General: Intubated sedated HEENT: anicteric sclera, pupils reactive to light and accommodation, EOMI CVS tachycardic rate, normal r,  no murmur rubs or gallops Chest:  rhonchi Abdomen: softnondistended, normal bowel sounds, Extremities: Purpuric rash prominent especially on lower extremities Skin:  Neuro: nonfocal, intubated sedated  Lab Results:  Recent Labs  08/06/13 0419 08/07/13 0400  WBC 13.4* 14.9*  HGB 10.7* 10.8*  HCT 29.8* 29.9*  PLT 18* 20*    BMET  Recent Labs  08/06/13 0419 08/07/13 0400  NA 138 141  K 4.1 4.4  CL 95* 99  CO2 29 27  GLUCOSE 180* 191*  BUN 94* 112*  CREATININE 3.55* 3.93*  CALCIUM 6.8* 6.9*    Micro Results: Recent Results (from the past 240 hour(s))  MRSA PCR SCREENING     Status: None   Collection Time    08/04/13  3:21 PM      Result Value Range Status   MRSA by PCR NEGATIVE  NEGATIVE Final   Comment:            The GeneXpert MRSA Assay (FDA     approved for NASAL specimens     only), is one component of a     comprehensive MRSA colonization     surveillance program. It is not     intended to diagnose MRSA     infection nor to  guide or     monitor treatment for     MRSA infections.  CULTURE, RESPIRATORY (NON-EXPECTORATED)     Status: None   Collection Time    08/04/13  8:27 PM      Result Value Range Status   Specimen Description TRACHEAL ASPIRATE   Final   Special Requests NONE   Final   Gram Stain     Final   Value: FEW WBC PRESENT,BOTH PMN AND MONONUCLEAR     NO SQUAMOUS EPITHELIAL CELLS SEEN     NO ORGANISMS SEEN     Performed at [facility]   Culture     Final   Value: FEW YEAST CONSISTENT WITH CANDIDA SPECIES     Performed at [facility]   Report Status 08/07/2013 FINAL   Final  CULTURE, BLOOD (ROUTINE X 2)     Status: None   Collection Time    08/04/13 11:08 PM      Result Value Range Status   Specimen Description BLOOD LEFT ARM   Final   Special Requests BOTTLES DRAWN AEROBIC ONLY 2.5CC   Final   Culture  Setup Time     Final   Value: 08/05/2013 04:03     Performed at [facility]   Culture     Final   Value:        BLOOD CULTURE RECEIVED NO GROWTH TO DATE CULTURE WILL BE HELD FOR 5 DAYS BEFORE ISSUING A FINAL NEGATIVE REPORT     Performed at [facility]   Report Status PENDING   Incomplete  CULTURE, BLOOD (ROUTINE X 2)     Status: None   Collection Time    08/04/13 11:25 PM      Result Value Range Status   Specimen Description BLOOD RIGHT CENTRAL LINE   Final   Special Requests BOTTLES DRAWN AEROBIC AND ANAEROBIC 10CC EACH   Final   Culture  Setup Time     Final   Value: 08/05/2013 04:06     Performed at [facility]   Culture     Final   Value:        BLOOD CULTURE RECEIVED NO GROWTH TO DATE CULTURE WILL BE HELD FOR 5 DAYS BEFORE ISSUING A FINAL NEGATIVE REPORT     Performed at [facility]   Report Status PENDING   Incomplete    Studies/Results: Dg Chest Port 1 View  08/07/2013   CLINICAL DATA:  Evaluate endotracheal tube placement.  EXAM: PORTABLE CHEST - 1 VIEW  COMPARISON:  CHEST x-ray 08/06/2013.  FINDINGS: An endotracheal  tube is in place with tip 4.0 cm above the carina. There is a  right-sided internal jugular central venous catheter with tip terminating in the mid superior vena cava. A nasogastric tube is seen extending into the stomach, however, the tip of the nasogastric tube extends below the lower margin of the image. Lung volumes are normal. Dense opacification at the base of the left hemithorax may reflect atelectasis and/or consolidation. Patchy nodular opacities are noted throughout the mida to lower lungs bilaterally. Possible trace left pleural effusion. No evidence of pulmonary edema. Heart size is borderline enlarged. Mediastinal contours are unremarkable.  IMPRESSION: 1. Support apparatus, as above. 2. Persistent atelectasis and/or consolidation in the left lower lobe. 3. Multifocal nodular opacities throughout the mid to lower lungs bilaterally. These are new compared to recent prior examinations dating back to 08/02/2013, and are therefore favored to be of infectious or inflammatory etiology.   Electronically Signed   By: Trudie Reed M.D.   On: 08/07/2013 05:45   Dg Chest Port 1 View  08/06/2013   CLINICAL DATA:  Endotracheal tube placement.  EXAM: PORTABLE CHEST - 1 VIEW  COMPARISON:  Chest radiograph August 05, 2013  FINDINGS: Endotracheal tube tip projects 3.1 cm above the equina. Right internal jugular central venous catheter with distal tip projecting in proximal superior vena cava. Nasogastric tube appears looped in proximal stomach, distal tip not imaged at least past the proximal duodenum.  The cardiac silhouette appears at least mildly numb to moderately enlarged, even with consideration to this low inspiratory portable examination with crowded vasculature markings. Interstitial prominence with decreasing patchy alveolar airspace opacities. Improved, small residual pleural effusions. No pneumothorax. Multiple EKG lines overlie the patient and may obscure subtle underlying pathology. Warming blanket  overlies the patient.  IMPRESSION: Endotracheal tube tip now projects 3.1 cm above the carinal, no apparent change in remaining life support lines.  Stable cardiomegaly with interstitial and decreasing alveolar airspace opacities favoring confluent edema and/ or pneumonia with decreased, minimal residual pleural effusions.   Electronically Signed   By: Awilda Metro   On: 08/06/2013 04:55      Assessment/Plan: Timothy Kline is a 52 y.o. male with   Hx of IVDU, Hepatitis C admitted with multifocal opacities on CXR, Sepsis, purpuric rash  #1 Sepsis: I agree that with IVDU and multifocal opacities that Right sided +/- Left sided endocarditis remain high in differential Agree with imaging chest with CT but apparently pt not stable for CT today TEE could be helfpul but with platelets at 24 and no impact on management with info at this point would not pursue  #2 Multifocal PNA: see above  #3 Hep C; check genotype and viral load  LOS: 3 days   Acey Lav 08/07/2013, 1:35 PM

## 2013-08-07 NOTE — Progress Notes (Signed)
I agree with above note.   Christoper Fabian, PharmD, BCPS Clinical pharmacist, pager [phone] 08/07/2013  5:52 PM

## 2013-08-07 NOTE — Progress Notes (Signed)
Timothy Kline   DOB:1960-11-09   IO#:[id number]    Subjective: Sedated and intubated. No history. Dialysis was started due to worsening renal failure. No reported bleeding from nursing staff  Objective:  Filed Vitals:   08/07/13 1630  BP: 113/71  Pulse: 131  Temp: 100.7 F (38.2 C)  Resp: 22     Intake/Output Summary (Last 24 hours) at 08/07/13 1639 Last data filed at 08/07/13 1500  Gross per 24 hour  Intake 3236.46 ml  Output   2385 ml  Net 851.46 ml    GENERAL:sedated and intubated SKIN: significant digital necrosis in extremities with associated rash EYES: closed, unable to assess OROPHARYNX:ET tube insitu, no exudate LYMPH:  no palpable lymphadenopathy in the cervical, axillary or inguinal LUNGS: clear to auscultation bilaterally, intubated and ventilated HEART: irregular rate & rhythm with bilateral lower extremity edema ABDOMEN:abdomen soft,normal bowel sounds Musculoskeletal: extremitiy necrosis noted   NEURO: sedated and ventilated   Labs:  Lab Results  Component Value Date   WBC 14.9* 08/07/2013   HGB 10.8* 08/07/2013   HCT 29.9* 08/07/2013   MCV 84.0 08/07/2013   PLT 20* 08/07/2013   NEUTROABS 13.3* 08/07/2013   Studies: I reviewed his Ct scan of chest and head and agreed with interpretation  Assessment: multi-organ failure with septic shock  Plan:  #1 severe thrombocytopenia  The cause is due to sepsis with possible low-grade DIC, in the background history of liver disease.The patient had no further active bleeding after platelet transfusion.  I would recommend keeping his platelet count above 20,000, if possible, due to coagulopathy from liver failure.  My overall impression is the patient does not have TTP.  #2 leukocytosis with septic shock, history of splenectomy Currently he is on broad-spectrum intravenous antibiotics. Continue aggressive broad IV antibiotics coverage. Likely source in the lung #3 anemia  His anemia has been stable, despite some mild  bleeding from the Foley catheter. No transfusion is required. There is no evidence to suggest active hemolysis. The majority of the elevated total bilirubin is from direct hyperbilirubinemia, likely secondary to liver disease.  #4 coagulopathy  This is likely due to sepsis, with possible liver disease. I recommend we check INR tomorrow and try to keep INR under 2 if possible. If the patient has more active bleeding, consider giving fresh frozen plasma  #5 history of hepatitis C  #6 respiratory failure  Continue ventilator management per critical care service  #7 liver failure  This could be due to liver disease from hepatitis C. Continue supportive care.  #8 acute renal failure  This is likely secondary to sepsis. Clinically, I do not think the patient been a frank TTP. Continue hemodialysis #9 Digital necrosis Likely due to hypoperfusion, severe sepsis. Contributed to high LDH.      [facility], Franchon Ketterman, MD 08/07/2013  4:39 PM

## 2013-08-07 NOTE — Progress Notes (Signed)
PULMONARY  / CRITICAL CARE MEDICINE  Name: Waldon Sheerin MRN: [id number] DOB: September 10, 1961    ADMISSION DATE:  08/04/2013 CONSULTATION DATE:  10/31  REFERRING MD :  [facility] PRIMARY SERVICE: PCCM  CHIEF COMPLAINT:  Respiratory Failure/ Sepsis/ Renal Failure  BRIEF PATIENT DESCRIPTION: [name] presented to [facility] 10/28 with AMS/Sepsis in setting of cocaine /oxycodone use. Pt admitted with Respiratory Failure (ARDS)/Sepsis has developed acute renal failure, thrombocytopenia, a-fib Flutter with RVR and was transferred to [facility] on 10/31 for HD and further care. CCM asked to admit.  SIGNIFICANT EVENTS / STUDIES:  10/28: Admission to [facility] 10/29: Metabolic Acidosis, pH=6.9 10/31: Transferred to [facility] for HD. 10/31: Cardioversion for Afib/Flutter rate 170's ( Unsuccessful).  Amiodarone Gtt initiated for Afib/Flutter RVR  11/1- new rash concerns, off pressors this am.  TTE showing EF 20-25%, no vegetations noted. 11/2 - tolerated HD, off pressors.  Platelet transfusion for thrombocytopenia  11/3 - CXR with new pulmonary opacities/nodules.  Concern for septic emboli.  CT chest to evaluate.    LINES / TUBES: 10/28: Right IJ CVC>>>11/1 10/29: oral ETT>>> 11/2 L IJ HD Cath>>   CULTURES: BCx2 10/31>>> Sputum 10/31>>>  ANTIBIOTICS: Rocephin: 10/28?>>>10/31 Levoquin:   10/28>>>10/31.....................................................Marland Kitchen Vancomycin 10/28>>> Cefipime 10/31>>> Flagyl 11/2 >>  VITAL SIGNS: Temp:  [98.9 F (37.2 C)-101.1 F (38.4 C)] 100.5 F (38.1 C) (11/03 0800) Pulse Rate:  [25-146] 124 (11/03 0928) Resp:  [15-24] 18 (11/03 0928) BP: (110-149)/(69-107) 124/79 mmHg (11/03 0928) SpO2:  [95 %-100 %] 97 % (11/03 0928) FiO2 (%):  [40 %-50 %] 40 % (11/03 0928) Weight:  [238 lb 1.6 oz (108 kg)] 238 lb 1.6 oz (108 kg) (11/03 0433)  HEMODYNAMICS: CVP:  [8 mmHg-9 mmHg] 8 mmHg  VENTILATOR SETTINGS: Vent Mode:  [-] PCV FiO2 (%):  [40 %-50 %] 40  % Set Rate:  [10 bmp-20 bmp] 16 bmp Vt Set:  [470 mL-540 mL] 540 mL PEEP:  [5 cmH20-8 cmH20] 5 cmH20 Plateau Pressure:  [20 cmH20-27 cmH20] 20 cmH20  INTAKE / OUTPUT: Intake/Output     11/02 0701 - 11/03 0700 11/03 0701 - 11/04 0700   I.V. (mL/kg) 2412.2 (22.3) 104.7 (1)   Blood 280    NG/GT 720 30   IV Piggyback 500    Total Intake(mL/kg) 3912.2 (36.2) 134.7 (1.2)   Urine (mL/kg/hr) 535 (0.2) 150 (0.3)   Other 1500 (0.6)    Total Output 2035 150   Net +1877.2 -15.3         PHYSICAL EXAMINATION: General: sedated on vent HEENT: NCAT, ETT in place PULM: Wheezing bilaterally CV: RRR, no mgr Ab: BS infrequent, soft Derm: rash> purpuric on feet/hands, blistering on arms Neuro: sedated on vent  LABS: I personally reviewed the lab work from the past 24 hours in comparison to previous and the relevant findings can be found in the A/P.   Imaging Dg Chest Port 1 View  08/07/2013  Multifocal nodular opacities throughout B/L mid/lower lung fields.  Dg Chest Port 1 View  08/06/2013  IMPRESSION: Endotracheal tube tip now projects 3.1 cm above the carinal, no apparent change in remaining life support lines.  Stable cardiomegaly with interstitial and decreasing alveolar airspace opacities favoring confluent edema and/ or pneumonia with decreased, minimal residual pleural effusions.   Electronically Signed   By: Awilda Metro   On: 08/06/2013 04:55    ASSESSMENT / PLAN:  PULMONARY A:  Acute Respiratory Failure ARDS Some volume overload Wheezing/suspect COPD Septic Emboli? Respiratory Acidosis  P:   ARDS protocol, keep plat less 30 Continue HD for volume removal Concern for septic emboli on CXR.  Will order CT w/o contrast to evaluate further  CARDIOVASCULAR A:  Hypotension 2/2 sepsis - unclear etiology, suspicion for endocarditis but ECHO showing no vegetations.  A-fib/Flutter with RVR - s/p cardioversion 10/31 without success CHF (due to sepsis?)> 11/1 TTE> LVEF 20-25%,  diffuse hypokinesis; PASP , RV dilated, reduced P:  Amio drip > maintain due arrhythmia issues.  Bolus 150mg  w/ increase to 60 mg/hr  Tele Will need repeat Echo at some point  Consider TEE for endocarditis 11/3 given IVDU  RENAL A:   Acute Renal Failure > 11/1 HD start Metabolic Acidosis-resolved Oliguria P:  HD per renal  GASTROINTESTINAL A:   P:   Tube feeds continue LFT's pending  ppi oxepa   HEMATOLOGIC A:   Thrombocytopenia - Hematology feels most like related to sepsis, not clearly HUS/TTP so holding plasmapheresis;  ? Hx of injecting oxy IV.Kidney / skin / hep c - r/o cryoglobulins  P:  Platelet transfusion 11/2, goal PLT > 20K scd Appreciate hematology Supportive care for now Evaluation for vasculitis drawn on 11/1 with C3/C4, ANCA, Cryglobulins SPEP to evaluate for possible malignancy, abnormal protein distributions   INFECTIOUS A:   Sepsis - unclear etiology, concern for endocarditis with ? IV drug use.  Initial CXR in [place] concerning for aspiration PNA Hep C Positive Toxic shock? P:   Consider TEE CT chest when able Vanc, cefipime, flagyl per ID  ENDOCRINE A: r/o rel AI P:   CBG's Q4 Cortisol if drops pressure again  NEUROLOGIC A:  Encephalopathic / sedated History of Chronic pain - Pain Clinic in [place] P:   Wake up assessment daily Minimize sedation CT head to evaluate for possible etiology   DERM: F/u derm biopsy results   I have personally obtained a history, examined the patient, evaluated laboratory and imaging results, formulated the assessment and plan and placed orders.  CRITICAL CARE: The patient is critically ill with multiple organ systems failure and requires high complexity decision making for assessment and support, frequent evaluation and titration of therapies, application of advanced monitoring technologies and extensive interpretation of multiple databases. Critical Care Time devoted to patient care services  described in this note is 50 minutes.   Twana First Paulina Fusi, DO of [facility] 08/07/2013, 11:44 AM  [facility] PCCM Pager: [phone] If no response, call [phone]  PCCM ATTENDING: I have interviewed and examined the patient and reviewed the database. I have formulated the assessment and plan as reflected in the note above with amendments made by me. 40 mins of direct critical care time provided  Billy Fischer, MD;  PCCM service; Mobile [phone]
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
## 2013-08-07 NOTE — Progress Notes (Signed)
S: sedated O:BP 124/79  Pulse 124  Temp(Src) 100.5 F (38.1 C) (Core (Comment))  Resp 18  Wt 108 kg (238 lb 1.6 oz)  SpO2 97%  Intake/Output Summary (Last 24 hours) at 08/07/13 0930 Last data filed at 08/07/13 0800  Gross per 24 hour  Intake 3785.53 ml  Output   2110 ml  Net 1675.53 ml   Weight change: -1.6 kg (-3 lb 8.4 oz) BJY:NWGNFAO, intubated ZHY:QMVHQ, irreg,irreg Resp: bilat crackles Abd:+ BS soft ND Ext: 1-2+ edema  Ischemic looking digits on hands and feet.  Rt fem HD catheter NEURO:sedated   . albuterol  8 puff Inhalation Q6H  . antiseptic oral rinse  15 mL Mouth Rinse QID  . ceFEPime (MAXIPIME) IV  1 g Intravenous Q24H  . chlorhexidine  15 mL Mouth Rinse BID  . dextrose      . feeding supplement (OXEPA)  1,000 mL Per Tube Q24H  . feeding supplement (PRO-STAT SUGAR FREE 64)  60 mL Per Tube TID  . methylPREDNISolone (SOLU-MEDROL) injection  60 mg Intravenous Q8H  . metronidazole  500 mg Intravenous Q8H  . pantoprazole (PROTONIX) IV  40 mg Intravenous QHS   Dg Chest Port 1 View  08/07/2013   CLINICAL DATA:  Evaluate endotracheal tube placement.  EXAM: PORTABLE CHEST - 1 VIEW  COMPARISON:  CHEST x-ray 08/06/2013.  FINDINGS: An endotracheal tube is in place with tip 4.0 cm above the carina. There is a right-sided internal jugular central venous catheter with tip terminating in the mid superior vena cava. A nasogastric tube is seen extending into the stomach, however, the tip of the nasogastric tube extends below the lower margin of the image. Lung volumes are normal. Dense opacification at the base of the left hemithorax may reflect atelectasis and/or consolidation. Patchy nodular opacities are noted throughout the mida to lower lungs bilaterally. Possible trace left pleural effusion. No evidence of pulmonary edema. Heart size is borderline enlarged. Mediastinal contours are unremarkable.  IMPRESSION: 1. Support apparatus, as above. 2. Persistent atelectasis and/or  consolidation in the left lower lobe. 3. Multifocal nodular opacities throughout the mid to lower lungs bilaterally. These are new compared to recent prior examinations dating back to 08/02/2013, and are therefore favored to be of infectious or inflammatory etiology.   Electronically Signed   By: Trudie Reed M.D.   On: 08/07/2013 05:45   Dg Chest Port 1 View  08/06/2013   CLINICAL DATA:  Endotracheal tube placement.  EXAM: PORTABLE CHEST - 1 VIEW  COMPARISON:  Chest radiograph August 05, 2013  FINDINGS: Endotracheal tube tip projects 3.1 cm above the equina. Right internal jugular central venous catheter with distal tip projecting in proximal superior vena cava. Nasogastric tube appears looped in proximal stomach, distal tip not imaged at least past the proximal duodenum.  The cardiac silhouette appears at least mildly numb to moderately enlarged, even with consideration to this low inspiratory portable examination with crowded vasculature markings. Interstitial prominence with decreasing patchy alveolar airspace opacities. Improved, small residual pleural effusions. No pneumothorax. Multiple EKG lines overlie the patient and may obscure subtle underlying pathology. Warming blanket overlies the patient.  IMPRESSION: Endotracheal tube tip now projects 3.1 cm above the carinal, no apparent change in remaining life support lines.  Stable cardiomegaly with interstitial and decreasing alveolar airspace opacities favoring confluent edema and/ or pneumonia with decreased, minimal residual pleural effusions.   Electronically Signed   By: Awilda Metro   On: 08/06/2013 04:55   BMET  Component Value Date/Time   NA 141 08/07/2013 0400   K 4.4 08/07/2013 0400   CL 99 08/07/2013 0400   CO2 27 08/07/2013 0400   GLUCOSE 191* 08/07/2013 0400   BUN 112* 08/07/2013 0400   CREATININE 3.93* 08/07/2013 0400   CALCIUM 6.9* 08/07/2013 0400   GFRNONAA 16* 08/07/2013 0400   GFRAA 19* 08/07/2013 0400   CBC    Component  Value Date/Time   WBC 14.9* 08/07/2013 0400   RBC 3.56* 08/07/2013 0400   RBC 3.54* 08/06/2013 0419   HGB 10.8* 08/07/2013 0400   HCT 29.9* 08/07/2013 0400   PLT 20* 08/07/2013 0400   MCV 84.0 08/07/2013 0400   MCH 30.3 08/07/2013 0400   MCHC 36.1* 08/07/2013 0400   RDW 16.3* 08/07/2013 0400   LYMPHSABS 0.6* 08/07/2013 0400   MONOABS 0.9 08/07/2013 0400   EOSABS 0.0 08/07/2013 0400   BASOSABS 0.1 08/07/2013 0400     Assessment:  1. AKI, oliguric presumably sec to sepsis.  ADAMTS pending 2. VDRF 3. + Hep C 4.  Thrombocytopenia presumably sec to sepsis Vs TTP 5. Afib/flutter on Amio  Plan: 1. DC IV at 50cc/hr 2. Plan HD today to try to remove fluid 3. Recheck labs in AM    Timothy Kline T

## 2013-08-08 ENCOUNTER — Inpatient Hospital Stay (HOSPITAL_COMMUNITY): Payer: BC Managed Care – PPO

## 2013-08-08 DIAGNOSIS — B9689 Other specified bacterial agents as the cause of diseases classified elsewhere: Secondary | ICD-10-CM

## 2013-08-08 DIAGNOSIS — I808 Phlebitis and thrombophlebitis of other sites: Secondary | ICD-10-CM

## 2013-08-08 DIAGNOSIS — K769 Liver disease, unspecified: Secondary | ICD-10-CM

## 2013-08-08 DIAGNOSIS — D696 Thrombocytopenia, unspecified: Secondary | ICD-10-CM

## 2013-08-08 DIAGNOSIS — B1920 Unspecified viral hepatitis C without hepatic coma: Secondary | ICD-10-CM

## 2013-08-08 DIAGNOSIS — D65 Disseminated intravascular coagulation [defibrination syndrome]: Secondary | ICD-10-CM

## 2013-08-08 DIAGNOSIS — R7881 Bacteremia: Secondary | ICD-10-CM

## 2013-08-08 DIAGNOSIS — N289 Disorder of kidney and ureter, unspecified: Secondary | ICD-10-CM

## 2013-08-08 DIAGNOSIS — A4189 Other specified sepsis: Secondary | ICD-10-CM

## 2013-08-08 LAB — COMPREHENSIVE METABOLIC PANEL
ALT: 7 U/L (ref 0–53)
AST: 23 U/L (ref 0–37)
Albumin: 1.5 g/dL — ABNORMAL LOW (ref 3.5–5.2)
Alkaline Phosphatase: 91 U/L (ref 39–117)
Chloride: 98 mEq/L (ref 96–112)
Creatinine, Ser: 4.19 mg/dL — ABNORMAL HIGH (ref 0.50–1.35)
GFR calc Af Amer: 17 mL/min — ABNORMAL LOW (ref >90)
Glucose, Bld: 184 mg/dL — ABNORMAL HIGH (ref 70–99)
Potassium: 3.6 mEq/L (ref 3.5–5.1)
Sodium: 139 mEq/L (ref 135–145)
Total Bilirubin: 2.2 mg/dL — ABNORMAL HIGH (ref 0.3–1.2)

## 2013-08-08 LAB — VANCOMYCIN, RANDOM: Vancomycin Rm: 14 ug/mL

## 2013-08-08 LAB — GLUCOSE, CAPILLARY
Glucose-Capillary: 166 mg/dL — ABNORMAL HIGH (ref 70–99)
Glucose-Capillary: 177 mg/dL — ABNORMAL HIGH (ref 70–99)
Glucose-Capillary: 182 mg/dL — ABNORMAL HIGH (ref 70–99)
Glucose-Capillary: 190 mg/dL — ABNORMAL HIGH (ref 70–99)

## 2013-08-08 LAB — PROTIME-INR: Prothrombin Time: 16.4 seconds — ABNORMAL HIGH (ref 11.6–15.2)

## 2013-08-08 LAB — ANCA SCREEN W REFLEX TITER
c-ANCA Screen: NEGATIVE
p-ANCA Screen: NEGATIVE

## 2013-08-08 LAB — CBC
HCT: 27.4 % — ABNORMAL LOW (ref 39.0–52.0)
MCV: 81.1 fL (ref 78.0–100.0)
Platelets: 18 10*3/uL — CL (ref 150–400)
RBC: 3.38 MIL/uL — ABNORMAL LOW (ref 4.22–5.81)
RDW: 15.6 % — ABNORMAL HIGH (ref 11.5–15.5)
WBC: 16.7 10*3/uL — ABNORMAL HIGH (ref 4.0–10.5)

## 2013-08-08 LAB — C4 COMPLEMENT: Complement C4, Body Fluid: 19 mg/dL (ref 10–40)

## 2013-08-08 MED ORDER — METOPROLOL TARTRATE 25 MG/10 ML ORAL SUSPENSION
50.0000 mg | Freq: Two times a day (BID) | ORAL | Status: DC
  Administered 2013-08-08 – 2013-08-12 (×6): 50 mg
  Filled 2013-08-08 (×12): qty 20

## 2013-08-08 MED ORDER — FUROSEMIDE 10 MG/ML IJ SOLN
160.0000 mg | Freq: Three times a day (TID) | INTRAVENOUS | Status: DC
  Administered 2013-08-08 – 2013-08-11 (×9): 160 mg via INTRAVENOUS
  Filled 2013-08-08 (×17): qty 16

## 2013-08-08 MED ORDER — METOPROLOL TARTRATE 1 MG/ML IV SOLN
2.5000 mg | INTRAVENOUS | Status: DC | PRN
  Administered 2013-08-16 – 2013-08-18 (×3): 5 mg via INTRAVENOUS
  Administered 2013-08-19: 2.5 mg via INTRAVENOUS
  Filled 2013-08-08 (×5): qty 5

## 2013-08-08 NOTE — Progress Notes (Signed)
PULMONARY  / CRITICAL CARE MEDICINE  Name: Timothy Kline MRN: [id number] DOB: Mar 04, 1961    ADMISSION DATE:  08/04/2013 CONSULTATION DATE:  10/31  REFERRING MD :  [facility] PRIMARY SERVICE: PCCM  CHIEF COMPLAINT:  Respiratory Failure/ Sepsis/ Renal Failure  BRIEF PATIENT DESCRIPTION: [name] presented to [facility] 10/28 with AMS/Sepsis in setting of cocaine /oxycodone use. Pt admitted with Respiratory Failure (ARDS)/Sepsis has developed acute renal failure, thrombocytopenia, a-fib Flutter with RVR and was transferred to [facility] on 10/31 for HD and further care. CCM asked to admit.  SIGNIFICANT EVENTS / STUDIES:  10/28 Admission to [facility] 10/29 Metabolic Acidosis, pH=6.9 10/31 Transferred to [facility] for HD. 10/31 Cardioversion for Afib/Flutter rate 170's ( Unsuccessful).  Amiodarone initiated for Afib/Flutter RVR 10/31 Renal consult: CRRT initiated 10/31 Heme consult: TCP likely due to DIC. Doubt TTP/HUS   11/01 Echocardiogram: LVEF 20-25%. Diffuse HK 11/01 ID consult 11/01 Skin biopsy: thrombotic vasculopathic reaction  11/2 tolerated HD, off pressors.  Platelet transfusion for thrombocytopenia  11/3 CXR with new pulmonary opacities/nodules.  Concern for septic emboli.  11/03 CT chest: Multilobar bilateral areas of pulmonary parenchymal nodular consolidation with internal necrosis/ cavitation and suggestion of surrounding hazy opacity. The appearance is most typical for septic emboli 11/03 CT head: NAD 11/03 BCx from [place] 2/2 + for FUSOBACTERIUM NECROPHORUM.   11/04 CT neck (noncontrasted):  11/04 Jugular vein [place]:   LINES / TUBES: R IJ CVL 10/28>>  ETT 10/29 >>  R femoral HD Cath 11/01 >>   CULTURES: [facility] >> + Fusobacterium Necrophorum BCx2 10/31>>> Negative Sputum 10/31>> Few yeast  ANTIBIOTICS: Rocephin: 10/28?>>>10/31 Levoquin:   10/28>>>10/31 Vancomycin 10/28>>> 11/3 Cefepime 10/31>>> 11/3 Flagyl 11/2 >> 11/3  Meropenem 11/3  >>   VITAL SIGNS: Temp:  [98.9 F (37.2 C)-101.3 F (38.5 C)] 98.9 F (37.2 C) (11/04 0352) Pulse Rate:  [25-136] 109 (11/04 0742) Resp:  [16-33] 22 (11/04 0742) BP: (93-142)/(60-87) 117/83 mmHg (11/04 0742) SpO2:  [95 %-100 %] 95 % (11/04 0742) FiO2 (%):  [40 %-50 %] 50 % (11/04 0742) Weight:  [229 lb 4.5 oz (104 kg)-239 lb 13.8 oz (108.8 kg)] 229 lb 4.5 oz (104 kg) (11/04 0500)  HEMODYNAMICS: CVP:  [8 mmHg] 8 mmHg  VENTILATOR SETTINGS: Vent Mode:  [-] PCV FiO2 (%):  [40 %-50 %] 50 % Set Rate:  [16 bmp-20 bmp] 16 bmp PEEP:  [5 cmH20-8 cmH20] 5 cmH20 Plateau Pressure:  [18 cmH20-27 cmH20] 19 cmH20  INTAKE / OUTPUT: Intake/Output     11/03 0701 - 11/04 0700 11/04 0701 - 11/05 0700   I.V. (mL/kg) 1488.9 (14.3)    Blood     NG/GT 660    IV Piggyback 150    Total Intake(mL/kg) 2298.9 (22.1)    Urine (mL/kg/hr) 1300 (0.5)    Other 2500 (1)    Total Output 3800     Net -1501.1          Stool Occurrence 1 x     PHYSICAL EXAMINATION: General: sedated on vent HEENT: NCAT, ETT in place PULM: Wheezing bilaterally CV: RRR, no mgr Ab: BS infrequent, soft Derm: Necrotic gangrenous extremities B/L UE/LE, blistering on arms Neuro: sedated on vent  LABS: I personally reviewed the lab work from the past 24 hours in comparison to previous and the relevant findings can be found in the A/P.     08/07/2013 CXR Multifocal nodular opacities throughout B/L mid/lower lung fields.    ASSESSMENT / PLAN:  PULMONARY  A:  Acute Respiratory Failure Suspect COPD Septic pulmonary emboli P:   Vent settings reviewed Cont vent bundle Daily SBT as indicated Cont BDs  CARDIOVASCULAR A:  Septic shock, resolved AFRVR Cardiomyopathy (EF 20-25%) P:  Cont amio drip 60 mg/hr Begin shceduled enteral metoprolol 11/04.   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  CRITICAL CARE: The patient is critically ill with multiple organ systems failure and requires high complexity decision making for assessment and support, frequent evaluation and titration of therapies, application of advanced monitoring technologies and extensive interpretation of multiple databases. Critical Care Time devoted to patient care services described in this note is 40 minutes.    Billy Fischer, MD ; [facility] [phone].  After 5:30 PM or weekends, call [phone]   Seen in conjunction with Bryan R. Paulina Fusi, DO of [facility] 08/08/2013, 7:59 AM  [place] PCCM Pager: [phone] If no response, call [phone]
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
## 2013-08-08 NOTE — Progress Notes (Signed)
[facility]   Dat # 2 meropenem  Subjective: Intubated sedated   Antibiotics:  Anti-infectives   Start     Dose/Rate Route Frequency Ordered Stop   08/07/13 1830  meropenem (MERREM) 500 mg in sodium chloride 0.9 % 50 mL IVPB     500 mg 100 mL/hr over 30 Minutes Intravenous Every 24 hours 08/07/13 1745     08/06/13 2000  vancomycin (VANCOCIN) IVPB 750 mg/150 ml premix     750 mg 150 mL/hr over 60 Minutes Intravenous  Once 08/06/13 1056 08/07/13 0330   08/05/13 2200  ceFEPIme (MAXIPIME) 1 g in dextrose 5 % 50 mL IVPB  Status:  Discontinued     1 g 100 mL/hr over 30 Minutes Intravenous Every 24 hours 08/04/13 1815 08/07/13 1656   08/05/13 1200  metroNIDAZOLE (FLAGYL) IVPB 500 mg  Status:  Discontinued     500 mg 100 mL/hr over 60 Minutes Intravenous Every 8 hours 08/05/13 1049 08/07/13 1656   08/04/13 1630  ceFEPIme (MAXIPIME) 2 g in dextrose 5 % 50 mL IVPB     2 g 100 mL/hr over 30 Minutes Intravenous  Once 08/04/13 1604 08/04/13 1747      Medications: Scheduled Meds: . antiseptic oral rinse  15 mL Mouth Rinse QID  . chlorhexidine  15 mL Mouth Rinse BID  . feeding supplement (OXEPA)  1,000 mL Per Tube Q24H  . feeding supplement (PRO-STAT SUGAR FREE 64)  60 mL Per Tube TID  . furosemide  160 mg Intravenous TID  . meropenem (MERREM) IV  500 mg Intravenous Q24H  . metoprolol tartrate  50 mg Per Tube BID  . pantoprazole (PROTONIX) IV  40 mg Intravenous QHS   Continuous Infusions: . amiodarone (NEXTERONE PREMIX) 360 mg/200 mL dextrose 60 mg/hr (08/08/13 0900)  . fentaNYL infusion INTRAVENOUS 100 mcg/hr (08/08/13 0919)  . midazolam (VERSED) infusion 2 mg/hr (08/08/13 0920)   PRN Meds:.sodium chloride, sodium chloride, sodium chloride, sodium chloride, sodium chloride, acetaminophen, albuterol, anticoagulant sodium citrate, fentaNYL, heparin, influenza vac split quadrivalent PF, lidocaine (PF), lidocaine-prilocaine, metoprolol, midazolam,  pentafluoroprop-tetrafluoroeth, pneumococcal 23 valent vaccine   Objective: Weight change: 1 lb 12.2 oz (0.8 kg)  Intake/Output Summary (Last 24 hours) at 08/08/13 1256 Last data filed at 08/08/13 1200  Gross per 24 hour  Intake 2132.76 ml  Output   3855 ml  Net -1722.24 ml   Blood pressure 121/80, pulse 117, temperature 100 F (37.8 C), temperature source Oral, resp. rate 28, weight 229 lb 4.5 oz (104 kg), SpO2 95.00%. Temp:  [98.6 F (37 C)-101.3 F (38.5 C)] 100 F (37.8 C) (11/04 1208) Pulse Rate:  [25-136] 117 (11/04 1208) Resp:  [16-33] 28 (11/04 1208) BP: (93-147)/(60-87) 121/80 mmHg (11/04 1208) SpO2:  [94 %-100 %] 95 % (11/04 1208) FiO2 (%):  [40 %-50 %] 40 % (11/04 1208) Weight:  [229 lb 4.5 oz (104 kg)-239 lb 13.8 oz (108.8 kg)] 229 lb 4.5 oz (104 kg) (11/04 0500)  Physical Exam: General: Intubated sedated HEENT: anicteric sclera, pupils reactive to light and accommodation, EOMI, right neck tender CVS tachycardic rate, normal r,  no murmur rubs or gallops Chest:  rhonchi Abdomen: softnondistended, normal bowel sounds, Extremities: Purpuric rash prominent especially on lower extremities Skin:  Neuro: nonfocal, intubated sedated  Lab Results:  Recent Labs  08/07/13 0400 08/08/13 0500  WBC 14.9* 16.7*  HGB 10.8* 10.1*  HCT 29.9* 27.4*  PLT 20* 18*    BMET  Recent Labs  08/07/13 0400 08/08/13 0500  NA 141 139  K 4.4 3.6  CL 99 98  CO2 27 24  GLUCOSE 191* 184*  BUN 112* 132*  CREATININE 3.93* 4.19*  CALCIUM 6.9* 7.1*    Micro Results: Recent Results (from the past 240 hour(s))  MRSA PCR SCREENING     Status: None   Collection Time    08/04/13  3:21 PM      Result Value Range Status   MRSA by PCR NEGATIVE  NEGATIVE Final   Comment:            The GeneXpert MRSA Assay (FDA     approved for NASAL specimens     only), is one component of a     comprehensive MRSA colonization     surveillance program. It is not     intended to diagnose  MRSA     infection nor to guide or     monitor treatment for     MRSA infections.  CULTURE, RESPIRATORY (NON-EXPECTORATED)     Status: None   Collection Time    08/04/13  8:27 PM      Result Value Range Status   Specimen Description TRACHEAL ASPIRATE   Final   Special Requests NONE   Final   Gram Stain     Final   Value: FEW WBC PRESENT,BOTH PMN AND MONONUCLEAR     NO SQUAMOUS EPITHELIAL CELLS SEEN     NO ORGANISMS SEEN     Performed at [facility]   Culture     Final   Value: FEW YEAST CONSISTENT WITH CANDIDA SPECIES     Performed at [facility]   Report Status 08/07/2013 FINAL   Final  CULTURE, BLOOD (ROUTINE X 2)     Status: None   Collection Time    08/04/13 11:08 PM      Result Value Range Status   Specimen Description BLOOD LEFT ARM   Final   Special Requests BOTTLES DRAWN AEROBIC ONLY 2.5CC   Final   Culture  Setup Time     Final   Value: 08/05/2013 04:03     Performed at [facility]   Culture     Final   Value:        BLOOD CULTURE RECEIVED NO GROWTH TO DATE CULTURE WILL BE HELD FOR 5 DAYS BEFORE ISSUING A FINAL NEGATIVE REPORT     Performed at [facility]   Report Status PENDING   Incomplete  CULTURE, BLOOD (ROUTINE X 2)     Status: None   Collection Time    08/04/13 11:25 PM      Result Value Range Status   Specimen Description BLOOD RIGHT CENTRAL LINE   Final   Special Requests BOTTLES DRAWN AEROBIC AND ANAEROBIC 10CC EACH   Final   Culture  Setup Time     Final   Value: 08/05/2013 04:06     Performed at [facility]   Culture     Final   Value:        BLOOD CULTURE RECEIVED NO GROWTH TO DATE CULTURE WILL BE HELD FOR 5 DAYS BEFORE ISSUING A FINAL NEGATIVE REPORT     Performed at [facility]   Report Status PENDING   Incomplete    Studies/Results: Ct Head Wo Contrast  08/07/2013   CLINICAL DATA:  Subsequent admission, patient difficulty breathing  EXAM: CT HEAD WITHOUT CONTRAST  TECHNIQUE: Contiguous axial  images were obtained from  the base of the skull through the vertex without intravenous contrast.  COMPARISON:  CT HEAD W/O CM dated 08/02/2013  FINDINGS: Patient intubated. No acute intracranial hemorrhage. No focal mass lesion. No CT evidence of acute infarction. No midline shift or mass effect. No hydrocephalus. Basilar cisterns are patent. Small fluid in the left mastoid air cells. Paranasal sinuses are clear. Frontal sinuses clear.  IMPRESSION: 1. No acute intracranial findings. No change from prior.  2. Small of fluid in the left mastoid air cells is new.   Electronically Signed   By: Genevive Bi M.D.   On: 08/07/2013 16:06   Ct Chest Wo Contrast  08/07/2013   CLINICAL DATA:  Sepsis, shortness of breath, pulmonary masses on prior exam. History of hepatitis-C and acute respiratory failure.  EXAM: CT CHEST WITHOUT CONTRAST  TECHNIQUE: Multidetector CT imaging of the chest was performed following the standard protocol without IV contrast.  COMPARISON:  08/07/2013 chest radiograph. No prior chest CT for comparison.  FINDINGS: Streak artifact from the patient's arms is present. Endotracheal tube is appropriately positioned. The nasogastric tube terminates below the level of the diaphragms. Right IJ approach central line tip terminates within the upper SVC. Mild cardiomegaly noted. No significant pericardial effusion. Trace pleural effusions are noted bilaterally. Incomplete imaging of the upper abdomen demonstrates a small amount of abdominal ascites. Great vessels are normal in caliber.  Dense the predominantly bilateral lower lobe confluent pulmonary parenchymal consolidation is noted with air bronchogram formation. There are patchy areas of multi focal bilateral airspace consolidation with internal cavitation. Imaging is degraded by respiratory motion. Allowing for this, there is a subjective suggestion of hazy parenchymal opacification surrounding these necrotic/cavitary lesions. These are predominantly  peripheral in location.  No acute osseous abnormality. Healing left inferior posterior rib fractures are noted, incompletely imaged.  IMPRESSION: Multilobar bilateral areas of pulmonary parenchymal nodular consolidation with internal necrosis/ cavitation and suggestion of surrounding hazy opacity. The appearance is most typical for septic emboli, focal necrotizing pneumonia including atypical agents such as fungal infection, or less likely bland infarct. Superimposed bilateral lower lobe consolidation may indicate pneumonia and or aspiration.   Electronically Signed   By: Christiana Pellant M.D.   On: 08/07/2013 15:54   Dg Chest Port 1 View  08/07/2013   CLINICAL DATA:  Evaluate endotracheal tube placement.  EXAM: PORTABLE CHEST - 1 VIEW  COMPARISON:  CHEST x-ray 08/06/2013.  FINDINGS: An endotracheal tube is in place with tip 4.0 cm above the carina. There is a right-sided internal jugular central venous catheter with tip terminating in the mid superior vena cava. A nasogastric tube is seen extending into the stomach, however, the tip of the nasogastric tube extends below the lower margin of the image. Lung volumes are normal. Dense opacification at the base of the left hemithorax may reflect atelectasis and/or consolidation. Patchy nodular opacities are noted throughout the mida to lower lungs bilaterally. Possible trace left pleural effusion. No evidence of pulmonary edema. Heart size is borderline enlarged. Mediastinal contours are unremarkable.  IMPRESSION: 1. Support apparatus, as above. 2. Persistent atelectasis and/or consolidation in the left lower lobe. 3. Multifocal nodular opacities throughout the mid to lower lungs bilaterally. These are new compared to recent prior examinations dating back to 08/02/2013, and are therefore favored to be of infectious or inflammatory etiology.   Electronically Signed   By: Trudie Reed M.D.   On: 08/07/2013 05:45      Assessment/Plan: Timothy Kline is a 52 y.o.  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 Family states thhat pt had been having neck pain for some tmie and was to get steroid injection and that his voice had changed recently  --Pt to have CT of neck today though would likely not be done with contrast, ? Other study such as doppler might be helpful  If he has Lemierr'es he will need anticoagualation and VVS to remove thrombosed infected vein   #2 Multifocal PNA: see above  #3 Hep C; check genotype and viral load  LOS: 4 days   Acey Lav 08/08/2013, 12:56 PM

## 2013-08-08 NOTE — Progress Notes (Signed)
Timothy Kline   DOB:23-Apr-1961   UE#:[id number]    Subjective: Off pressors. No bleeding per nursing staff.  Objective:  Filed Vitals:   08/08/13 1700  BP: 151/96  Pulse: 129  Temp: 101 F (38.3 C)  Resp: 32     Intake/Output Summary (Last 24 hours) at 08/08/13 1706 Last data filed at 08/08/13 1700  Gross per 24 hour  Intake 2115.63 ml  Output   4905 ml  Net -2789.37 ml    GENERAL:remained sedated and intubated SKIN: significant necrotic skin lesions LYMPH:  no palpable lymphadenopathy in the cervical, axillary or inguinal HEART: irregular rate & rhythm with occasional murmurs and bilateral lower extremity edema Musculoskeletal:nsignificant peripheral cyanosis of digits and digital necrosis NEURO:sedated and ventilated   Labs:  Lab Results  Component Value Date   WBC 16.7* 08/08/2013   HGB 10.1* 08/08/2013   HCT 27.4* 08/08/2013   MCV 81.1 08/08/2013   PLT 18* 08/08/2013   NEUTROABS 13.3* 08/07/2013    @LASTCHEMISTRY @  Studies:  Ct Head Wo Contrast  08/07/2013   CLINICAL DATA:  Subsequent admission, patient difficulty breathing  EXAM: CT HEAD WITHOUT CONTRAST  TECHNIQUE: Contiguous axial images were obtained from the base of the skull through the vertex without intravenous contrast.  COMPARISON:  CT HEAD W/O CM dated 08/02/2013  FINDINGS: Patient intubated. No acute intracranial hemorrhage. No focal mass lesion. No CT evidence of acute infarction. No midline shift or mass effect. No hydrocephalus. Basilar cisterns are patent. Small fluid in the left mastoid air cells. Paranasal sinuses are clear. Frontal sinuses clear.  IMPRESSION: 1. No acute intracranial findings. No change from prior.  2. Small of fluid in the left mastoid air cells is new.   Electronically Signed   By: Genevive Bi M.D.   On: 08/07/2013 16:06   Ct Soft Tissue Neck Wo Contrast  08/08/2013   CLINICAL DATA:  Sepsis. Acute renal failure.  Rule out neck abscess.  EXAM: CT NECK WITHOUT CONTRAST  TECHNIQUE:  Multidetector CT imaging of the neck was performed following the standard protocol without intravenous contrast.  COMPARISON:  None.  FINDINGS: Lack of intravenous contrast to the limitation of this examination.  Right jugular central venous catheter is noted extending into the right innominate vein with tip not visualized. The right jugular vein does not appear distended or ill-defined however the study cannot exclude venous thrombosis without intravenous contrast. Ultrasound of the neck may be helpful to evaluate for jugular vein thrombosis. Superior mediastinum does not show any mass or fluid collection.  Negative for fluid collection or abscess in the neck. No mass lesion is identified. No edema in the neck.  The patient is intubated. NG tube is in place.  Mild cervical degenerative changes. No acute bony change in the cervical spine. Extensive dental infection is noted with multiple caries and bony changes of chronic dental infection.  IMPRESSION: Right Jugular central venous catheter. No definite evidence of jugular venous thrombosis however this study is limited to evaluate for venous thrombosis without intravenous contrast. Consider followup neck ultrasound.  Negative for mass, edema, or abscess in the neck.   Electronically Signed   By: Marlan Palau M.D.   On: 08/08/2013 15:29   Ct Chest Wo Contrast  08/07/2013   CLINICAL DATA:  Sepsis, shortness of breath, pulmonary masses on prior exam. History of hepatitis-C and acute respiratory failure.  EXAM: CT CHEST WITHOUT CONTRAST  TECHNIQUE: Multidetector CT imaging of the chest was performed following the standard protocol without IV  contrast.  COMPARISON:  08/07/2013 chest radiograph. No prior chest CT for comparison.  FINDINGS: Streak artifact from the patient's arms is present. Endotracheal tube is appropriately positioned. The nasogastric tube terminates below the level of the diaphragms. Right IJ approach central line tip terminates within the upper  SVC. Mild cardiomegaly noted. No significant pericardial effusion. Trace pleural effusions are noted bilaterally. Incomplete imaging of the upper abdomen demonstrates a small amount of abdominal ascites. Great vessels are normal in caliber.  Dense the predominantly bilateral lower lobe confluent pulmonary parenchymal consolidation is noted with air bronchogram formation. There are patchy areas of multi focal bilateral airspace consolidation with internal cavitation. Imaging is degraded by respiratory motion. Allowing for this, there is a subjective suggestion of hazy parenchymal opacification surrounding these necrotic/cavitary lesions. These are predominantly peripheral in location.  No acute osseous abnormality. Healing left inferior posterior rib fractures are noted, incompletely imaged.  IMPRESSION: Multilobar bilateral areas of pulmonary parenchymal nodular consolidation with internal necrosis/ cavitation and suggestion of surrounding hazy opacity. The appearance is most typical for septic emboli, focal necrotizing pneumonia including atypical agents such as fungal infection, or less likely bland infarct. Superimposed bilateral lower lobe consolidation may indicate pneumonia and or aspiration.   Electronically Signed   By: Christiana Pellant M.D.   On: 08/07/2013 15:54   Dg Chest Port 1 View  08/07/2013   CLINICAL DATA:  Evaluate endotracheal tube placement.  EXAM: PORTABLE CHEST - 1 VIEW  COMPARISON:  CHEST x-ray 08/06/2013.  FINDINGS: An endotracheal tube is in place with tip 4.0 cm above the carina. There is a right-sided internal jugular central venous catheter with tip terminating in the mid superior vena cava. A nasogastric tube is seen extending into the stomach, however, the tip of the nasogastric tube extends below the lower margin of the image. Lung volumes are normal. Dense opacification at the base of the left hemithorax may reflect atelectasis and/or consolidation. Patchy nodular opacities are  noted throughout the mida to lower lungs bilaterally. Possible trace left pleural effusion. No evidence of pulmonary edema. Heart size is borderline enlarged. Mediastinal contours are unremarkable.  IMPRESSION: 1. Support apparatus, as above. 2. Persistent atelectasis and/or consolidation in the left lower lobe. 3. Multifocal nodular opacities throughout the mid to lower lungs bilaterally. These are new compared to recent prior examinations dating back to 08/02/2013, and are therefore favored to be of infectious or inflammatory etiology.   Electronically Signed   By: Trudie Reed M.D.   On: 08/07/2013 05:45    Assessment:  Multiorgan failure from septic shock  Plan:  #1 severe thrombocytopenia  The cause is due to sepsis with possible low-grade DIC, in the background history of liver disease.The patient had no further active bleeding. Critical care service is managing, felt transfusion not needed right now My overall impression is the patient does not have TTP.  #2 leukocytosis with septic shock, history of splenectomy Currently he is on broad-spectrum intravenous antibiotics. Continue aggressive broad IV antibiotics coverage. #3 anemia  His anemia has been stable, despite some mild bleeding from the Foley catheter. No transfusion is required. There is no evidence to suggest active hemolysis. The majority of the elevated total bilirubin is from direct hyperbilirubinemia, likely secondary to liver disease.  #4 coagulopathy  This is likely due to sepsis, with possible liver disease.  #5 history of hepatitis C  #6 respiratory failure  Continue ventilator management per critical care service  #7 liver failure  This could be due to liver  disease from hepatitis C. Continue supportive care.  #8 acute renal failure  This is likely secondary to sepsis. Clinically, I do not think the patient been a frank TTP. Continue hemodialysis as directed from nephologist  I will sign off. Case is discussed  with critical care service.  [facility], Midas Daughety, MD 08/08/2013  5:06 PM

## 2013-08-08 NOTE — H&P (Signed)
Patient seen and examined, agree with above note.  I dictated the care and orders written for this patient under my direction.  Wesam G Yacoub, MD [phone] 

## 2013-08-08 NOTE — Progress Notes (Signed)
S: sedated O:BP 117/83  Pulse 109  Temp(Src) 98.9 F (37.2 C) (Oral)  Resp 22  Wt 104 kg (229 lb 4.5 oz)  SpO2 95%  Intake/Output Summary (Last 24 hours) at 08/08/13 0817 Last data filed at 08/08/13 0600  Gross per 24 hour  Intake 2164.19 ml  Output   3650 ml  Net -1485.81 ml   Weight change: 0.8 kg (1 lb 12.2 oz) ZOX:WRUEAVW, intubated UJW:JXBJY, irreg,irreg Resp: bilat crackles Abd:+ BS soft ND Ext: 1-2+ edema  Ischemic looking digits on hands and feet. Weeping from sloughed skin on forearms  Rt fem HD catheter NEURO:sedated   . albuterol  8 puff Inhalation Q6H  . antiseptic oral rinse  15 mL Mouth Rinse QID  . chlorhexidine  15 mL Mouth Rinse BID  . feeding supplement (OXEPA)  1,000 mL Per Tube Q24H  . feeding supplement (PRO-STAT SUGAR FREE 64)  60 mL Per Tube TID  . meropenem (MERREM) IV  500 mg Intravenous Q24H  . methylPREDNISolone (SOLU-MEDROL) injection  60 mg Intravenous Q8H  . metoprolol  5 mg Intravenous Q6H  . pantoprazole (PROTONIX) IV  40 mg Intravenous QHS   Ct Head Wo Contrast  08/07/2013   CLINICAL DATA:  Subsequent admission, patient difficulty breathing  EXAM: CT HEAD WITHOUT CONTRAST  TECHNIQUE: Contiguous axial images were obtained from the base of the skull through the vertex without intravenous contrast.  COMPARISON:  CT HEAD W/O CM dated 08/02/2013  FINDINGS: Patient intubated. No acute intracranial hemorrhage. No focal mass lesion. No CT evidence of acute infarction. No midline shift or mass effect. No hydrocephalus. Basilar cisterns are patent. Small fluid in the left mastoid air cells. Paranasal sinuses are clear. Frontal sinuses clear.  IMPRESSION: 1. No acute intracranial findings. No change from prior.  2. Small of fluid in the left mastoid air cells is new.   Electronically Signed   By: Genevive Bi M.D.   On: 08/07/2013 16:06   Ct Chest Wo Contrast  08/07/2013   CLINICAL DATA:  Sepsis, shortness of breath, pulmonary masses on prior exam.  History of hepatitis-C and acute respiratory failure.  EXAM: CT CHEST WITHOUT CONTRAST  TECHNIQUE: Multidetector CT imaging of the chest was performed following the standard protocol without IV contrast.  COMPARISON:  08/07/2013 chest radiograph. No prior chest CT for comparison.  FINDINGS: Streak artifact from the patient's arms is present. Endotracheal tube is appropriately positioned. The nasogastric tube terminates below the level of the diaphragms. Right IJ approach central line tip terminates within the upper SVC. Mild cardiomegaly noted. No significant pericardial effusion. Trace pleural effusions are noted bilaterally. Incomplete imaging of the upper abdomen demonstrates a small amount of abdominal ascites. Great vessels are normal in caliber.  Dense the predominantly bilateral lower lobe confluent pulmonary parenchymal consolidation is noted with air bronchogram formation. There are patchy areas of multi focal bilateral airspace consolidation with internal cavitation. Imaging is degraded by respiratory motion. Allowing for this, there is a subjective suggestion of hazy parenchymal opacification surrounding these necrotic/cavitary lesions. These are predominantly peripheral in location.  No acute osseous abnormality. Healing left inferior posterior rib fractures are noted, incompletely imaged.  IMPRESSION: Multilobar bilateral areas of pulmonary parenchymal nodular consolidation with internal necrosis/ cavitation and suggestion of surrounding hazy opacity. The appearance is most typical for septic emboli, focal necrotizing pneumonia including atypical agents such as fungal infection, or less likely bland infarct. Superimposed bilateral lower lobe consolidation may indicate pneumonia and or aspiration.   Electronically Signed  By: Christiana Pellant M.D.   On: 08/07/2013 15:54   Dg Chest Port 1 View  08/07/2013   CLINICAL DATA:  Evaluate endotracheal tube placement.  EXAM: PORTABLE CHEST - 1 VIEW   COMPARISON:  CHEST x-ray 08/06/2013.  FINDINGS: An endotracheal tube is in place with tip 4.0 cm above the carina. There is a right-sided internal jugular central venous catheter with tip terminating in the mid superior vena cava. A nasogastric tube is seen extending into the stomach, however, the tip of the nasogastric tube extends below the lower margin of the image. Lung volumes are normal. Dense opacification at the base of the left hemithorax may reflect atelectasis and/or consolidation. Patchy nodular opacities are noted throughout the mida to lower lungs bilaterally. Possible trace left pleural effusion. No evidence of pulmonary edema. Heart size is borderline enlarged. Mediastinal contours are unremarkable.  IMPRESSION: 1. Support apparatus, as above. 2. Persistent atelectasis and/or consolidation in the left lower lobe. 3. Multifocal nodular opacities throughout the mid to lower lungs bilaterally. These are new compared to recent prior examinations dating back to 08/02/2013, and are therefore favored to be of infectious or inflammatory etiology.   Electronically Signed   By: Trudie Reed M.D.   On: 08/07/2013 05:45   BMET    Component Value Date/Time   NA 139 08/08/2013 0500   K 3.6 08/08/2013 0500   CL 98 08/08/2013 0500   CO2 24 08/08/2013 0500   GLUCOSE 184* 08/08/2013 0500   BUN 132* 08/08/2013 0500   CREATININE 4.19* 08/08/2013 0500   CALCIUM 7.1* 08/08/2013 0500   GFRNONAA 15* 08/08/2013 0500   GFRAA 17* 08/08/2013 0500   CBC    Component Value Date/Time   WBC 16.7* 08/08/2013 0500   RBC 3.38* 08/08/2013 0500   RBC 3.54* 08/06/2013 0419   HGB 10.1* 08/08/2013 0500   HCT 27.4* 08/08/2013 0500   PLT 18* 08/08/2013 0500   MCV 81.1 08/08/2013 0500   MCH 29.9 08/08/2013 0500   MCHC 36.9* 08/08/2013 0500   RDW 15.6* 08/08/2013 0500   LYMPHSABS 0.6* 08/07/2013 0400   MONOABS 0.9 08/07/2013 0400   EOSABS 0.0 08/07/2013 0400   BASOSABS 0.1 08/07/2013 0400     Assessment:  1. AKI, oliguric  presumably sec to sepsis.  ADAMTS pending 2. VDRF 3. + Hep C 4.  Thrombocytopenia presumably sec to sepsis Vs TTP 5. Afib/flutter on Amio  Plan: 1. Since he is making some urine, will give lasix to try to stimulate more output 2. Plan HD in if waste products cont to rise 3.  Does he still need the large doses of solumedrol? 4. Recheck labs in am    Oaklan Persons T

## 2013-08-08 NOTE — Progress Notes (Signed)
eLink Physician-Brief Progress Note Patient Name: Timothy Kline DOB: 1961-05-03 MRN: [id number]  Date of Service  08/08/2013   HPI/Events of Note   Stool sincreased , rectal punch failure, plat 18,. No bleeding  eICU Interventions  Place flexiseal, caution plat, d/w rn   Intervention Category Minor Interventions: Agitation / anxiety - evaluation and management;Clinical assessment - ordering diagnostic tests;Communication with other healthcare providers and/or family;Routine modifications to care plan (e.g. PRN medications for pain, fever)  Nelda Bucks. 08/08/2013, 5:42 PM

## 2013-08-09 DIAGNOSIS — A419 Sepsis, unspecified organism: Secondary | ICD-10-CM

## 2013-08-09 DIAGNOSIS — R197 Diarrhea, unspecified: Secondary | ICD-10-CM

## 2013-08-09 DIAGNOSIS — R0602 Shortness of breath: Secondary | ICD-10-CM

## 2013-08-09 LAB — PROTEIN ELECTROPHORESIS, SERUM
Albumin ELP: 46.6 % — ABNORMAL LOW (ref 55.8–66.1)
Alpha-1-Globulin: 10.8 % — ABNORMAL HIGH (ref 2.9–4.9)
Alpha-2-Globulin: 15.2 % — ABNORMAL HIGH (ref 7.1–11.8)
Beta 2: 4 % (ref 3.2–6.5)
Gamma Globulin: 18.4 % (ref 11.1–18.8)
Total Protein ELP: 4.3 g/dL — ABNORMAL LOW (ref 6.0–8.3)

## 2013-08-09 LAB — GLUCOSE, CAPILLARY
Glucose-Capillary: 133 mg/dL — ABNORMAL HIGH (ref 70–99)
Glucose-Capillary: 137 mg/dL — ABNORMAL HIGH (ref 70–99)
Glucose-Capillary: 169 mg/dL — ABNORMAL HIGH (ref 70–99)
Glucose-Capillary: 171 mg/dL — ABNORMAL HIGH (ref 70–99)
Glucose-Capillary: 194 mg/dL — ABNORMAL HIGH (ref 70–99)

## 2013-08-09 LAB — CBC
HCT: 28 % — ABNORMAL LOW (ref 39.0–52.0)
MCH: 30.1 pg (ref 26.0–34.0)
MCHC: 36.8 g/dL — ABNORMAL HIGH (ref 30.0–36.0)
RBC: 3.42 MIL/uL — ABNORMAL LOW (ref 4.22–5.81)
RDW: 16.2 % — ABNORMAL HIGH (ref 11.5–15.5)

## 2013-08-09 LAB — RENAL FUNCTION PANEL
Calcium: 7.2 mg/dL — ABNORMAL LOW (ref 8.4–10.5)
GFR calc Af Amer: 14 mL/min — ABNORMAL LOW (ref >90)
GFR calc non Af Amer: 12 mL/min — ABNORMAL LOW (ref >90)
Phosphorus: 8.8 mg/dL — ABNORMAL HIGH (ref 2.3–4.6)
Potassium: 4 mEq/L (ref 3.5–5.1)
Sodium: 143 mEq/L (ref 135–145)

## 2013-08-09 LAB — CLOSTRIDIUM DIFFICILE BY PCR: Toxigenic C. Difficile by PCR: NEGATIVE

## 2013-08-09 MED ORDER — ACETAMINOPHEN 10 MG/ML IV SOLN
1000.0000 mg | Freq: Once | INTRAVENOUS | Status: AC
  Administered 2013-08-09: 1000 mg via INTRAVENOUS
  Filled 2013-08-09 (×2): qty 100

## 2013-08-09 MED ORDER — ACETAMINOPHEN 160 MG/5ML PO SOLN
650.0000 mg | ORAL | Status: DC | PRN
  Administered 2013-08-09 – 2013-08-16 (×6): 650 mg
  Filled 2013-08-09 (×8): qty 20.3

## 2013-08-09 MED ORDER — DEXMEDETOMIDINE HCL IN NACL 200 MCG/50ML IV SOLN
0.4000 ug/kg/h | INTRAVENOUS | Status: DC
  Administered 2013-08-09: 0.6 ug/kg/h via INTRAVENOUS
  Administered 2013-08-09: 1 ug/kg/h via INTRAVENOUS
  Administered 2013-08-09: 0.4 ug/kg/h via INTRAVENOUS
  Administered 2013-08-10: 1.1 ug/kg/h via INTRAVENOUS
  Administered 2013-08-10: 1.048 ug/kg/h via INTRAVENOUS
  Administered 2013-08-10: 0.4 ug/kg/h via INTRAVENOUS
  Administered 2013-08-10: 1.1 ug/kg/h via INTRAVENOUS
  Administered 2013-08-10 (×2): 1 ug/kg/h via INTRAVENOUS
  Administered 2013-08-10: 0.4 ug/kg/h via INTRAVENOUS
  Filled 2013-08-09 (×10): qty 50

## 2013-08-09 NOTE — Progress Notes (Signed)
25 mL of Versed wasted in sink. Witnessed by Aquilla Hacker. RN

## 2013-08-09 NOTE — Procedures (Signed)
Pt seen on HD.  BFR 250.  Some hypotension so will not pull fluid.  Hypotension started after sedation started.

## 2013-08-09 NOTE — Progress Notes (Signed)
[facility]   Dat # 3 meropenem  Subjective: Intubated sedated   Antibiotics:  Anti-infectives   Start     Dose/Rate Route Frequency Ordered Stop   08/07/13 1830  meropenem (MERREM) 500 mg in sodium chloride 0.9 % 50 mL IVPB     500 mg 100 mL/hr over 30 Minutes Intravenous Every 24 hours 08/07/13 1745     08/06/13 2000  vancomycin (VANCOCIN) IVPB 750 mg/150 ml premix     750 mg 150 mL/hr over 60 Minutes Intravenous  Once 08/06/13 1056 08/07/13 0330   08/05/13 2200  ceFEPIme (MAXIPIME) 1 g in dextrose 5 % 50 mL IVPB  Status:  Discontinued     1 g 100 mL/hr over 30 Minutes Intravenous Every 24 hours 08/04/13 1815 08/07/13 1656   08/05/13 1200  metroNIDAZOLE (FLAGYL) IVPB 500 mg  Status:  Discontinued     500 mg 100 mL/hr over 60 Minutes Intravenous Every 8 hours 08/05/13 1049 08/07/13 1656   08/04/13 1630  ceFEPIme (MAXIPIME) 2 g in dextrose 5 % 50 mL IVPB     2 g 100 mL/hr over 30 Minutes Intravenous  Once 08/04/13 1604 08/04/13 1747      Medications: Scheduled Meds: . antiseptic oral rinse  15 mL Mouth Rinse QID  . chlorhexidine  15 mL Mouth Rinse BID  . feeding supplement (OXEPA)  1,000 mL Per Tube Q24H  . feeding supplement (PRO-STAT SUGAR FREE 64)  60 mL Per Tube TID  . furosemide  160 mg Intravenous TID  . meropenem (MERREM) IV  500 mg Intravenous Q24H  . metoprolol tartrate  50 mg Per Tube BID  . pantoprazole (PROTONIX) IV  40 mg Intravenous QHS   Continuous Infusions: . amiodarone (NEXTERONE PREMIX) 360 mg/200 mL dextrose 30 mg/hr (08/09/13 1026)  . dexmedetomidine Stopped (08/09/13 1300)  . fentaNYL infusion INTRAVENOUS 50 mcg/hr (08/09/13 1215)   PRN Meds:.sodium chloride, sodium chloride, sodium chloride, sodium chloride, sodium chloride, acetaminophen (TYLENOL) oral liquid 160 mg/5 mL, albuterol, anticoagulant sodium citrate, fentaNYL, heparin, influenza vac split quadrivalent PF, lidocaine (PF), lidocaine-prilocaine,  metoprolol, midazolam, pentafluoroprop-tetrafluoroeth, pneumococcal 23 valent vaccine   Objective: Weight change: -20 lb 4.5 oz (-9.2 kg)  Intake/Output Summary (Last 24 hours) at 08/09/13 1651 Last data filed at 08/09/13 1401  Gross per 24 hour  Intake 2401.11 ml  Output   3683 ml  Net -1281.89 ml   Blood pressure 123/76, pulse 94, temperature 100.2 F (37.9 C), temperature source Oral, resp. rate 21, weight 220 lb 14.4 oz (100.2 kg), SpO2 99.00%. Temp:  [98.3 F (36.8 C)-101.9 F (38.8 C)] 100.2 F (37.9 C) (11/05 1606) Pulse Rate:  [78-160] 94 (11/05 1606) Resp:  [19-52] 21 (11/05 1606) BP: (76-151)/(49-96) 123/76 mmHg (11/05 1606) SpO2:  [94 %-99 %] 99 % (11/05 1606) FiO2 (%):  [40 %] 40 % (11/05 1606) Weight:  [219 lb 9.3 oz (99.6 kg)-220 lb 14.4 oz (100.2 kg)] 220 lb 14.4 oz (100.2 kg) (11/05 1036)  Physical Exam: General: Intubated sedated HEENT: anicteric sclera, pupils reactive to light and accommodation, EOMI, right neck tender CVS tachycardic rate, normal r,  no murmur rubs or gallops Chest:  rhonchi Abdomen: softnondistended, normal bowel sounds, Extremities: Purpuric rash prominent especially on lower extremities Skin:  Neuro: nonfocal, intubated sedated  Lab Results:  Recent Labs  08/08/13 0500 08/09/13 0425  WBC 16.7* 21.8*  HGB 10.1* 10.3*  HCT 27.4* 28.0*  PLT 18* 31*    BMET  Recent  Labs  08/08/13 0500 08/09/13 0425  NA 139 143  K 3.6 4.0  CL 98 101  CO2 24 22  GLUCOSE 184* 184*  BUN 132* 179*  CREATININE 4.19* 4.95*  CALCIUM 7.1* 7.2*    Micro Results: Recent Results (from the past 240 hour(s))  MRSA PCR SCREENING     Status: None   Collection Time    08/04/13  3:21 PM      Result Value Range Status   MRSA by PCR NEGATIVE  NEGATIVE Final   Comment:            The GeneXpert MRSA Assay (FDA     approved for NASAL specimens     only), is one component of a     comprehensive MRSA colonization     surveillance program. It is  not     intended to diagnose MRSA     infection nor to guide or     monitor treatment for     MRSA infections.  CULTURE, RESPIRATORY (NON-EXPECTORATED)     Status: None   Collection Time    08/04/13  8:27 PM      Result Value Range Status   Specimen Description TRACHEAL ASPIRATE   Final   Special Requests NONE   Final   Gram Stain     Final   Value: FEW WBC PRESENT,BOTH PMN AND MONONUCLEAR     NO SQUAMOUS EPITHELIAL CELLS SEEN     NO ORGANISMS SEEN     Performed at [facility]   Culture     Final   Value: FEW YEAST CONSISTENT WITH CANDIDA SPECIES     Performed at [facility]   Report Status 08/07/2013 FINAL   Final  CULTURE, BLOOD (ROUTINE X 2)     Status: None   Collection Time    08/04/13 11:08 PM      Result Value Range Status   Specimen Description BLOOD LEFT ARM   Final   Special Requests BOTTLES DRAWN AEROBIC ONLY 2.5CC   Final   Culture  Setup Time     Final   Value: 08/05/2013 04:03     Performed at [facility]   Culture     Final   Value:        BLOOD CULTURE RECEIVED NO GROWTH TO DATE CULTURE WILL BE HELD FOR 5 DAYS BEFORE ISSUING A FINAL NEGATIVE REPORT     Performed at [facility]   Report Status PENDING   Incomplete  CULTURE, BLOOD (ROUTINE X 2)     Status: None   Collection Time    08/04/13 11:25 PM      Result Value Range Status   Specimen Description BLOOD RIGHT CENTRAL LINE   Final   Special Requests BOTTLES DRAWN AEROBIC AND ANAEROBIC 10CC EACH   Final   Culture  Setup Time     Final   Value: 08/05/2013 04:06     Performed at [facility]   Culture     Final   Value:        BLOOD CULTURE RECEIVED NO GROWTH TO DATE CULTURE WILL BE HELD FOR 5 DAYS BEFORE ISSUING A FINAL NEGATIVE REPORT     Performed at [facility]   Report Status PENDING   Incomplete    Studies/Results: Ct Soft Tissue Neck Wo Contrast  08/08/2013   CLINICAL DATA:  Sepsis. Acute renal failure.  Rule out neck abscess.  EXAM: CT NECK  WITHOUT CONTRAST  TECHNIQUE: Multidetector CT imaging of the neck was performed following the standard protocol without intravenous contrast.  COMPARISON:  None.  FINDINGS: Lack of intravenous contrast to the limitation of this examination.  Right jugular central venous catheter is noted extending into the right innominate vein with tip not visualized. The right jugular vein does not appear distended or ill-defined however the study cannot exclude venous thrombosis without intravenous contrast. Ultrasound of the neck may be helpful to evaluate for jugular vein thrombosis. Superior mediastinum does not show any mass or fluid collection.  Negative for fluid collection or abscess in the neck. No mass lesion is identified. No edema in the neck.  The patient is intubated. NG tube is in place.  Mild cervical degenerative changes. No acute bony change in the cervical spine. Extensive dental infection is noted with multiple caries and bony changes of chronic dental infection.  IMPRESSION: Right Jugular central venous catheter. No definite evidence of jugular venous thrombosis however this study is limited to evaluate for venous thrombosis without intravenous contrast. Consider followup neck ultrasound.  Negative for mass, edema, or abscess in the neck.   Electronically Signed   By: Marlan Palau M.D.   On: 08/08/2013 15:29      Assessment/Plan: Timothy Kline is a 52 y.o. 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 Now with diarrhea and also persistent fevers.   #1 Fusobacterium Bacteremia and septic shock:CT neck S contrast without clot and Doppler without clot  --continue MERREM   #2 Multifocal PNA:  Due to Fusobacterium +/- other anerobes, perhaps GNR, GPC --check tracheal aspirate for cx given fevers  #3 Diarrhea: check C difficile PCR  #4 Fevers: check c diff and tracheal aspirate  #3 Hep C; check genotype and  viral load  #4 TTPEnia from DIC stablizing  #5 ARF: on HD conventional now  LOS: 5 days   Acey Lav 08/09/2013, 4:51 PM

## 2013-08-09 NOTE — Progress Notes (Signed)
S: sedated O:BP 112/67  Pulse 81  Temp(Src) 98.5 F (36.9 C) (Oral)  Resp 21  Wt 99.6 kg (219 lb 9.3 oz)  SpO2 98%  Intake/Output Summary (Last 24 hours) at 08/09/13 0817 Last data filed at 08/09/13 0600  Gross per 24 hour  Intake 2496.6 ml  Output   4420 ml  Net -1923.4 ml   Weight change: -9.2 kg (-20 lb 4.5 oz) ZOX:WRUEAVW, intubated CVS: RRR Resp: bilat crackles Abd:+ BS soft ND Ext: 1-2+ edema  Ischemic looking digits on hands and feet.  Rt fem HD catheter NEURO:sedated   . antiseptic oral rinse  15 mL Mouth Rinse QID  . chlorhexidine  15 mL Mouth Rinse BID  . feeding supplement (OXEPA)  1,000 mL Per Tube Q24H  . feeding supplement (PRO-STAT SUGAR FREE 64)  60 mL Per Tube TID  . furosemide  160 mg Intravenous TID  . meropenem (MERREM) IV  500 mg Intravenous Q24H  . metoprolol tartrate  50 mg Per Tube BID  . pantoprazole (PROTONIX) IV  40 mg Intravenous QHS   Ct Head Wo Contrast  08/07/2013   CLINICAL DATA:  Subsequent admission, patient difficulty breathing  EXAM: CT HEAD WITHOUT CONTRAST  TECHNIQUE: Contiguous axial images were obtained from the base of the skull through the vertex without intravenous contrast.  COMPARISON:  CT HEAD W/O CM dated 08/02/2013  FINDINGS: Patient intubated. No acute intracranial hemorrhage. No focal mass lesion. No CT evidence of acute infarction. No midline shift or mass effect. No hydrocephalus. Basilar cisterns are patent. Small fluid in the left mastoid air cells. Paranasal sinuses are clear. Frontal sinuses clear.  IMPRESSION: 1. No acute intracranial findings. No change from prior.  2. Small of fluid in the left mastoid air cells is new.   Electronically Signed   By: Genevive Bi M.D.   On: 08/07/2013 16:06   Ct Soft Tissue Neck Wo Contrast  08/08/2013   CLINICAL DATA:  Sepsis. Acute renal failure.  Rule out neck abscess.  EXAM: CT NECK WITHOUT CONTRAST  TECHNIQUE: Multidetector CT imaging of the neck was performed following the  standard protocol without intravenous contrast.  COMPARISON:  None.  FINDINGS: Lack of intravenous contrast to the limitation of this examination.  Right jugular central venous catheter is noted extending into the right innominate vein with tip not visualized. The right jugular vein does not appear distended or ill-defined however the study cannot exclude venous thrombosis without intravenous contrast. Ultrasound of the neck may be helpful to evaluate for jugular vein thrombosis. Superior mediastinum does not show any mass or fluid collection.  Negative for fluid collection or abscess in the neck. No mass lesion is identified. No edema in the neck.  The patient is intubated. NG tube is in place.  Mild cervical degenerative changes. No acute bony change in the cervical spine. Extensive dental infection is noted with multiple caries and bony changes of chronic dental infection.  IMPRESSION: Right Jugular central venous catheter. No definite evidence of jugular venous thrombosis however this study is limited to evaluate for venous thrombosis without intravenous contrast. Consider followup neck ultrasound.  Negative for mass, edema, or abscess in the neck.   Electronically Signed   By: Marlan Palau M.D.   On: 08/08/2013 15:29   Ct Chest Wo Contrast  08/07/2013   CLINICAL DATA:  Sepsis, shortness of breath, pulmonary masses on prior exam. History of hepatitis-C and acute respiratory failure.  EXAM: CT CHEST WITHOUT CONTRAST  TECHNIQUE: Multidetector CT  imaging of the chest was performed following the standard protocol without IV contrast.  COMPARISON:  08/07/2013 chest radiograph. No prior chest CT for comparison.  FINDINGS: Streak artifact from the patient's arms is present. Endotracheal tube is appropriately positioned. The nasogastric tube terminates below the level of the diaphragms. Right IJ approach central line tip terminates within the upper SVC. Mild cardiomegaly noted. No significant pericardial effusion.  Trace pleural effusions are noted bilaterally. Incomplete imaging of the upper abdomen demonstrates a small amount of abdominal ascites. Great vessels are normal in caliber.  Dense the predominantly bilateral lower lobe confluent pulmonary parenchymal consolidation is noted with air bronchogram formation. There are patchy areas of multi focal bilateral airspace consolidation with internal cavitation. Imaging is degraded by respiratory motion. Allowing for this, there is a subjective suggestion of hazy parenchymal opacification surrounding these necrotic/cavitary lesions. These are predominantly peripheral in location.  No acute osseous abnormality. Healing left inferior posterior rib fractures are noted, incompletely imaged.  IMPRESSION: Multilobar bilateral areas of pulmonary parenchymal nodular consolidation with internal necrosis/ cavitation and suggestion of surrounding hazy opacity. The appearance is most typical for septic emboli, focal necrotizing pneumonia including atypical agents such as fungal infection, or less likely bland infarct. Superimposed bilateral lower lobe consolidation may indicate pneumonia and or aspiration.   Electronically Signed   By: Christiana Pellant M.D.   On: 08/07/2013 15:54   BMET    Component Value Date/Time   NA 143 08/09/2013 0425   K 4.0 08/09/2013 0425   CL 101 08/09/2013 0425   CO2 22 08/09/2013 0425   GLUCOSE 184* 08/09/2013 0425   BUN 179* 08/09/2013 0425   CREATININE 4.95* 08/09/2013 0425   CALCIUM 7.2* 08/09/2013 0425   GFRNONAA 12* 08/09/2013 0425   GFRAA 14* 08/09/2013 0425   CBC    Component Value Date/Time   WBC 21.8* 08/09/2013 0425   RBC 3.42* 08/09/2013 0425   RBC 3.54* 08/06/2013 0419   HGB 10.3* 08/09/2013 0425   HCT 28.0* 08/09/2013 0425   PLT 31* 08/09/2013 0425   MCV 81.9 08/09/2013 0425   MCH 30.1 08/09/2013 0425   MCHC 36.8* 08/09/2013 0425   RDW 16.2* 08/09/2013 0425   LYMPHSABS 0.6* 08/07/2013 0400   MONOABS 0.9 08/07/2013 0400   EOSABS 0.0 08/07/2013  0400   BASOSABS 0.1 08/07/2013 0400     Assessment:  1. AKI, oliguric presumably sec to sepsis.  2. VDRF  3. + Hep C 4.  Thrombocytopenia presumably sec to sepsis Vs TTP, ADAMTS still pending 5. Afib/flutter on Amio  Plan: 1.HD today 2. Cont IV lasix 3. Recheck labs in AM   Jedrek Dinovo T

## 2013-08-09 NOTE — Progress Notes (Signed)
*  PRELIMINARY RESULTS* Vascular Ultrasound Limited upper extremity venous duplex to evaluate internal jugular veins has been completed.  Preliminary findings: No evidence of DVT or thrombophlebitis.     Farrel Demark, RDMS, RVT  08/09/2013, 10:48 AM

## 2013-08-09 NOTE — Progress Notes (Signed)
PULMONARY  / CRITICAL CARE MEDICINE  Name: Timothy Kline MRN: [id number] DOB: 04-27-1961    ADMISSION DATE:  08/04/2013 CONSULTATION DATE:  10/31  REFERRING MD :  [facility] PRIMARY SERVICE: PCCM  CHIEF COMPLAINT:  Respiratory Failure/ Sepsis/ Renal Failure  BRIEF PATIENT DESCRIPTION: [name] presented to [facility] 10/28 with AMS/Sepsis in setting of cocaine /oxycodone use. Pt admitted with Respiratory Failure (ARDS)/Sepsis has developed acute renal failure, thrombocytopenia, a-fib Flutter with RVR and was transferred to [facility] on 10/31 for HD and further care. CCM asked to admit.  SIGNIFICANT EVENTS / STUDIES:  10/28 Admission to [facility] 10/29 Metabolic Acidosis, pH=6.9 10/31 Transferred to [facility] for HD. 10/31 Cardioversion for Afib/Flutter rate 170's ( Unsuccessful).  Amiodarone initiated for Afib/Flutter RVR 10/31 Renal consult: CRRT initiated 10/31 Heme consult: TCP likely due to DIC. Doubt TTP/HUS   11/01 Echocardiogram: LVEF 20-25%. Diffuse HK 11/01 ID consult 11/01 Skin biopsy: thrombotic vasculopathic reaction  11/2 tolerated HD, off pressors.  Platelet transfusion for thrombocytopenia  11/3 CXR with new pulmonary opacities/nodules.  Concern for septic emboli.  11/03 CT chest: Multilobar bilateral areas of pulmonary parenchymal nodular consolidation with internal necrosis/ cavitation and suggestion of surrounding hazy opacity. The appearance is most typical for septic emboli 11/03 CT head: NAD 11/03 BCx from [place] 2/2 + for FUSOBACTERIUM NECROPHORUM.   11/04 CT neck (noncontrasted): No evidence of jugular vein thrombosis, abscesses, inflammation.   11/05 Jugular vein [place]: No thrombus noted 11/04 Converted to NSR from Afib/Aflutter  11/05 Dexmedetomidine initiated in anticipation of extubation soon  LINES / TUBES: R IJ CVL 10/28 >>  ETT 10/29 >>  R femoral HD Cath 11/01 >>   CULTURES: [facility] >> + Fusobacterium Necrophorum BCx2  10/31>>> Negative Sputum 10/31>> Few yeast  ANTIBIOTICS: Rocephin: 10/28?>>>10/31 Levoquin:   10/28>>>10/31 Vancomycin 10/28>>> 11/3 Cefepime 10/31>>> 11/3 Flagyl 11/2 >> 11/3  Meropenem 11/3 >>   VITAL SIGNS: Temp:  [98.5 F (36.9 C)-101.9 F (38.8 C)] 98.5 F (36.9 C) (11/05 0719) Pulse Rate:  [81-160] 81 (11/05 0719) Resp:  [21-38] 21 (11/05 0719) BP: (104-151)/(60-96) 112/67 mmHg (11/05 0719) SpO2:  [94 %-98 %] 98 % (11/05 0719) FiO2 (%):  [40 %-50 %] 40 % (11/05 0719) Weight:  [219 lb 9.3 oz (99.6 kg)] 219 lb 9.3 oz (99.6 kg) (11/05 0500)  HEMODYNAMICS:    VENTILATOR SETTINGS: Vent Mode:  [-] CPAP FiO2 (%):  [40 %-50 %] 40 % Set Rate:  [16 bmp] 16 bmp PEEP:  [5 cmH20] 5 cmH20 Pressure Support:  [10 cmH20] 10 cmH20 Plateau Pressure:  [17 cmH20-23 cmH20] 19 cmH20  INTAKE / OUTPUT: Intake/Output     11/04 0701 - 11/05 0700 11/05 0701 - 11/06 0700   I.V. (mL/kg) 1661.9 (16.7)    NG/GT 780    IV Piggyback 150    Total Intake(mL/kg) 2591.9 (26)    Urine (mL/kg/hr) 2920 (1.2)    Other     Stool 1600 (0.7)    Total Output 4520     Net -1928.1           PHYSICAL EXAMINATION: General: sedated on vent HEENT: NCAT, ETT in place PULM: Wheezing bilaterally, improving  CV: RRR, no mgr Ab: BS infrequent, soft Derm: Necrotic gangrenous extremities B/L UE/LE, blistering on arms Neuro: sedated on vent  LABS/IMGAING: I personally reviewed the lab work from the past 24 hours in comparison to previous and the relevant findings can be found in the A/P.   08/07/2013 CXR  Multifocal nodular opacities throughout B/L mid/lower lung fields.     ASSESSMENT / PLAN:  PULMONARY A:  Acute Respiratory Failure Suspect COPD Septic pulmonary emboli P:   Vent settings reviewed Cont vent bundle Daily SBT as indicated Cont BDs  CARDIOVASCULAR A:  Septic shock, resolved AFRVR - Converted 08/08/13 Cardiomyopathy (EF 20-25%) Digital Dry Gangrene  P:  Decrease Amio drip to  30cc/hr  Continue schedule enteral Metoprolol, PRN IV metoprolol for HR > 115/min Consider repeat Echo prior to discharge  RENAL A:   Acute Renal Failure > 11/1 HD start Metabolic Acidosis, resolved Oliguria, resolved P:  Renal following Lasix ordered 11/04, UOP to 1.2 ml/kg on 11/4  Monitor BMET intermittently Correct electrolytes as indicated  GASTROINTESTINAL A:   Hypoalbuminemia Protein-calorie malnutrition P:   Cont TFs  HEMATOLOGIC A:   Mild anemia DIC  Severe thrombocytopenia - Improving  P:  Monitor CBC intermittently Avoid plt transfusion unless actively bleeding  INFECTIOUS A:   Severe sepsis - Resolved  Hep C Positive 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  CRITICAL CARE: The patient is critically ill with multiple organ systems failure and requires high complexity decision making for assessment and support, frequent evaluation and titration of therapies, application of advanced monitoring technologies and extensive interpretation of multiple databases. Critical Care Time devoted to patient care services described in this note is 40 minutes.   Billy Fischer, MD ; [facility] [phone].  After 5:30 PM or weekends, call [phone]   Seen in conjunction with: Twana First. Paulina Fusi, DO of [facility] 08/09/2013, 8:00 AM  [place] PCCM If no response, call [phone]

## 2013-08-10 ENCOUNTER — Inpatient Hospital Stay (HOSPITAL_COMMUNITY): Payer: BC Managed Care – PPO

## 2013-08-10 DIAGNOSIS — I38 Endocarditis, valve unspecified: Secondary | ICD-10-CM

## 2013-08-10 DIAGNOSIS — A498 Other bacterial infections of unspecified site: Secondary | ICD-10-CM

## 2013-08-10 LAB — GLUCOSE, CAPILLARY
Glucose-Capillary: 137 mg/dL — ABNORMAL HIGH (ref 70–99)
Glucose-Capillary: 149 mg/dL — ABNORMAL HIGH (ref 70–99)
Glucose-Capillary: 159 mg/dL — ABNORMAL HIGH (ref 70–99)

## 2013-08-10 LAB — CBC
HCT: 27.3 % — ABNORMAL LOW (ref 39.0–52.0)
Hemoglobin: 9.8 g/dL — ABNORMAL LOW (ref 13.0–17.0)
MCH: 30.3 pg (ref 26.0–34.0)
MCHC: 35.9 g/dL (ref 30.0–36.0)
MCV: 84.5 fL (ref 78.0–100.0)
RBC: 3.23 MIL/uL — ABNORMAL LOW (ref 4.22–5.81)

## 2013-08-10 LAB — RENAL FUNCTION PANEL
BUN: 180 mg/dL — ABNORMAL HIGH (ref 6–23)
CO2: 23 mEq/L (ref 19–32)
Calcium: 7.6 mg/dL — ABNORMAL LOW (ref 8.4–10.5)
Creatinine, Ser: 4.53 mg/dL — ABNORMAL HIGH (ref 0.50–1.35)
GFR calc non Af Amer: 14 mL/min — ABNORMAL LOW (ref >90)
Glucose, Bld: 137 mg/dL — ABNORMAL HIGH (ref 70–99)
Sodium: 145 mEq/L (ref 135–145)

## 2013-08-10 LAB — ADAMTS13 ACTIVITY: Adamts 13 Activity: 36 % Activity — ABNORMAL LOW (ref 68–163)

## 2013-08-10 LAB — HCV RNA QUANT: HCV Quantitative: 1221206 IU/mL — ABNORMAL HIGH (ref <15)

## 2013-08-10 MED ORDER — AMIODARONE HCL 200 MG PO TABS
400.0000 mg | ORAL_TABLET | Freq: Two times a day (BID) | ORAL | Status: DC
  Administered 2013-08-10: 400 mg
  Filled 2013-08-10 (×2): qty 2

## 2013-08-10 MED ORDER — OXEPA PO LIQD
1000.0000 mL | ORAL | Status: DC
  Administered 2013-08-10 – 2013-08-13 (×4): 1000 mL
  Filled 2013-08-10 (×7): qty 1000

## 2013-08-10 MED ORDER — DEXMEDETOMIDINE HCL IN NACL 400 MCG/100ML IV SOLN
0.4000 ug/kg/h | INTRAVENOUS | Status: DC
  Administered 2013-08-10: 1.1 ug/kg/h via INTRAVENOUS
  Administered 2013-08-10 (×2): 1.2 ug/kg/h via INTRAVENOUS
  Administered 2013-08-11: 0.9 ug/kg/h via INTRAVENOUS
  Administered 2013-08-11 – 2013-08-12 (×6): 1.1 ug/kg/h via INTRAVENOUS
  Administered 2013-08-12: 1 ug/kg/h via INTRAVENOUS
  Administered 2013-08-12: 0.5 ug/kg/h via INTRAVENOUS
  Administered 2013-08-12: 1.1 ug/kg/h via INTRAVENOUS
  Administered 2013-08-13 – 2013-08-14 (×7): 1 ug/kg/h via INTRAVENOUS
  Filled 2013-08-10 (×21): qty 100

## 2013-08-10 MED ORDER — AMIODARONE HCL 200 MG PO TABS
400.0000 mg | ORAL_TABLET | Freq: Two times a day (BID) | ORAL | Status: DC
  Administered 2013-08-10 – 2013-08-13 (×7): 400 mg via ORAL
  Filled 2013-08-10 (×9): qty 2

## 2013-08-10 MED ORDER — VANCOMYCIN HCL IN DEXTROSE 750-5 MG/150ML-% IV SOLN
750.0000 mg | Freq: Once | INTRAVENOUS | Status: AC
  Administered 2013-08-10: 750 mg via INTRAVENOUS
  Filled 2013-08-10: qty 150

## 2013-08-10 MED ORDER — AMIODARONE HCL 200 MG PO TABS: 200.0000 mg | ORAL_TABLET | Freq: Every day | ORAL | Status: DC

## 2013-08-10 NOTE — Progress Notes (Signed)
PULMONARY  / CRITICAL CARE MEDICINE  Name: Timothy Kline MRN: [id number] DOB: [birth year]    ADMISSION DATE:  08/04/2013 CONSULTATION DATE:  10/31  REFERRING MD :  [facility] PRIMARY SERVICE: PCCM  CHIEF COMPLAINT:  Respiratory Failure/ Sepsis/ Renal Failure  BRIEF PATIENT DESCRIPTION: [name] presented to [facility] 10/28 with AMS/Sepsis in setting of cocaine /oxycodone use. Pt admitted with Respiratory Failure (ARDS)/Sepsis has developed acute renal failure, thrombocytopenia, a-fib Flutter with RVR and was transferred to [facility] on 10/31 for HD and further care. CCM asked to admit.  SIGNIFICANT EVENTS / STUDIES:  10/28 Admission to [facility] 10/29 Metabolic Acidosis, pH=6.9 10/31 Transferred to [facility] for HD. 10/31 Cardioversion for Afib/Flutter rate 170's ( Unsuccessful).  Amiodarone initiated for Afib/Flutter RVR 10/31 Renal consult: CRRT initiated 10/31 Heme consult: TCP likely due to DIC. Doubt TTP/HUS 11/01 Echocardiogram: LVEF 20-25%. Diffuse HK 11/01 ID consult 11/01 Skin biopsy: thrombotic vasculopathic reaction  11/2 tolerated HD, off pressors.  Platelet transfusion for thrombocytopenia  11/3 CXR with new pulmonary opacities/nodules.  Concern for septic emboli.  11/03 CT chest: Multilobar bilateral areas of pulmonary parenchymal nodular consolidation with internal necrosis/ cavitation and suggestion of surrounding hazy opacity. The appearance is most typical for septic emboli 11/03 CT head: NAD 11/03 BCx from [place] 2/2 + for FUSOBACTERIUM NECROPHORUM.   11/04 CT neck (noncontrasted): No evidence of jugular vein thrombosis, abscesses, inflammation.   11/05 Jugular vein [place]: No thrombus noted 11/04 Converted to NSR from Afib/Aflutter  11/05 Dexmedetomidine initiated in anticipation of extubation soon.   11/06 C Diff negative, Tracheal aspirate pending   LINES / TUBES: R IJ CVL 10/28 >>  ETT 10/29 >>  R femoral HD Cath 11/01 >>   CULTURES: [facility] >> + Fusobacterium Necrophorum BCx2 10/31>>> Negative Sputum 10/31>> Few yeast Tracheal Asp. 11/05>> C Diff 11/5 >> Negative   ANTIBIOTICS: Rocephin: 10/28?>>>10/31 Levoquin:   10/28>>>10/31 Vancomycin 10/28>>> 11/3 Cefepime 10/31>>> 11/3 Flagyl 11/2 >> 11/3  Meropenem 11/3 >>   VITAL SIGNS: Temp:  [98.3 F (36.8 C)-101.6 F (38.7 C)] 101.2 F (38.4 C) (11/06 0800) Pulse Rate:  [78-101] 99 (11/06 0800) Resp:  [19-52] 36 (11/06 0800) BP: (76-135)/(49-90) 127/80 mmHg (11/06 0800) SpO2:  [95 %-100 %] 100 % (11/06 0800) FiO2 (%):  [40 %] 40 % (11/06 0726) Weight:  [219 lb 2.2 oz (99.4 kg)-220 lb 14.4 oz (100.2 kg)] 219 lb 2.2 oz (99.4 kg) (11/06 0443)  HEMODYNAMICS:    VENTILATOR SETTINGS: Vent Mode:  [-] PCV FiO2 (%):  [40 %] 40 % Set Rate:  [16 bmp] 16 bmp PEEP:  [5 cmH20] 5 cmH20 Plateau Pressure:  [15 cmH20-22 cmH20] 22 cmH20  INTAKE / OUTPUT: Intake/Output     11/05 0701 - 11/06 0700 11/06 0701 - 11/07 0700   I.V. (mL/kg) 1097.3 (11) 20 (0.2)   NG/GT 1020    IV Piggyback 182    Total Intake(mL/kg) 2299.3 (23.1) 20 (0.2)   Urine (mL/kg/hr) 3000 (1.3)    Other -57 (-0)    Stool 2000 (0.8)    Total Output 4943     Net -2643.7 +20         PHYSICAL EXAMINATION: General: sedated on vent HEENT: NCAT, ETT in place PULM: Wheezing bilaterally, improving  CV: RRR Ab: Soft, NABS Derm: Necrotic gangrenous extremities B/L UE/LE, blistering on arms Neuro: sedated on vent  LABS/IMGAING: I personally reviewed the lab work from the past 24 hours in comparison to previous and the  relevant findings can be found in the A/P.   08/09/13 - US neck/duplex - Negative for thrombosis   08/10/13 - CXR showing improving aeration in LLL, consistent nodular opacities    ASSESSMENT / PLAN:  PULMONARY A:  Acute Respiratory Failure Suspect COPD Septic pulmonary emboli P:   Vent settings reviewed, continue bundle  Daily SBT as indicated Cont BDs Cont  Precedex   CARDIOVASCULAR A:  Septic shock, resolved AFRVR - Converted 08/08/13 Cardiomyopathy (EF 20-25%) Digital Dry Gangrene  P:  Will switch amiodarone to 400 mg BID enteral, further dosing per pharmacy  Continue schedule enteral Metoprolol, PRN IV metoprolol for HR > 115/min Consider repeat Echo prior to discharge  RENAL A:   Acute Renal Failure > 11/1 HD start Metabolic Acidosis, resolved Oliguria, resolved P:  Renal following, HD today  Continue Lasix  Monitor BMET intermittently Correct electrolytes as indicated  GASTROINTESTINAL A:   Hypoalbuminemia Protein-calorie malnutrition P:   Cont TFs, will increase to 45 kcal/hr   HEMATOLOGIC A:   Mild anemia DIC  Severe thrombocytopenia - Improving  P:  Repeat CBC w/ diff in AM as increase in WBC today Platelets improving today, repeat in AM  Avoid plt transfusion unless actively bleeding  INFECTIOUS A:   Severe sepsis - Resolved  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  CRITICAL CARE: The patient is critically ill with multiple organ systems failure and requires high complexity decision making for assessment and support, frequent evaluation and titration of therapies, application of advanced monitoring technologies and extensive interpretation of multiple databases. Critical Care Time devoted to patient care services  described in this note is 40 minutes.    Billy Fischer, MD ; [facility] [phone].  After 5:30 PM or weekends, call [phone]   Seen in conjunction with: Twana First. Paulina Fusi, DO of [facility] 08/10/2013, 8:11 AM

## 2013-08-10 NOTE — Progress Notes (Signed)
[facility]   Dat # 4 meropenem  Subjective: Intubated sedated   Antibiotics:  Anti-infectives   Start     Dose/Rate Route Frequency Ordered Stop   08/07/13 1830  meropenem (MERREM) 500 mg in sodium chloride 0.9 % 50 mL IVPB     500 mg 100 mL/hr over 30 Minutes Intravenous Every 24 hours 08/07/13 1745     08/06/13 2000  vancomycin (VANCOCIN) IVPB 750 mg/150 ml premix     750 mg 150 mL/hr over 60 Minutes Intravenous  Once 08/06/13 1056 08/07/13 0330   08/05/13 2200  ceFEPIme (MAXIPIME) 1 g in dextrose 5 % 50 mL IVPB  Status:  Discontinued     1 g 100 mL/hr over 30 Minutes Intravenous Every 24 hours 08/04/13 1815 08/07/13 1656   08/05/13 1200  metroNIDAZOLE (FLAGYL) IVPB 500 mg  Status:  Discontinued     500 mg 100 mL/hr over 60 Minutes Intravenous Every 8 hours 08/05/13 1049 08/07/13 1656   08/04/13 1630  ceFEPIme (MAXIPIME) 2 g in dextrose 5 % 50 mL IVPB     2 g 100 mL/hr over 30 Minutes Intravenous  Once 08/04/13 1604 08/04/13 1747      Medications: Scheduled Meds: . amiodarone  400 mg Oral BID   Followed by  . [START ON 08/16/2013] amiodarone  200 mg Oral Daily  . antiseptic oral rinse  15 mL Mouth Rinse QID  . chlorhexidine  15 mL Mouth Rinse BID  . feeding supplement (OXEPA)  1,000 mL Per Tube Q24H  . feeding supplement (PRO-STAT SUGAR FREE 64)  60 mL Per Tube TID  . furosemide  160 mg Intravenous TID  . meropenem (MERREM) IV  500 mg Intravenous Q24H  . metoprolol tartrate  50 mg Per Tube BID  . pantoprazole (PROTONIX) IV  40 mg Intravenous QHS   Continuous Infusions: . dexmedetomidine 1.1 mcg/kg/hr (08/10/13 1519)  . fentaNYL infusion INTRAVENOUS 75 mcg/hr (08/09/13 1855)   PRN Meds:.sodium chloride, sodium chloride, sodium chloride, sodium chloride, sodium chloride, acetaminophen (TYLENOL) oral liquid 160 mg/5 mL, albuterol, anticoagulant sodium citrate, fentaNYL, heparin, influenza vac split quadrivalent PF, lidocaine (PF),  lidocaine-prilocaine, metoprolol, midazolam, pentafluoroprop-tetrafluoroeth, pneumococcal 23 valent vaccine   Objective: Weight change: 1 lb 5.2 oz (0.6 kg)  Intake/Output Summary (Last 24 hours) at 08/10/13 1543 Last data filed at 08/10/13 1400  Gross per 24 hour  Intake 2331.92 ml  Output   4950 ml  Net -2618.08 ml   Blood pressure 120/76, pulse 107, temperature 103 F (39.4 C), temperature source Oral, resp. rate 37, weight 219 lb 2.2 oz (99.4 kg), SpO2 99.00%. Temp:  [99.8 F (37.7 C)-103.1 F (39.5 C)] 103 F (39.4 C) (11/06 1430) Pulse Rate:  [85-109] 107 (11/06 1430) Resp:  [21-41] 37 (11/06 1430) BP: (80-135)/(51-90) 120/76 mmHg (11/06 1430) SpO2:  [97 %-100 %] 99 % (11/06 1430) FiO2 (%):  [40 %] 40 % (11/06 1242) Weight:  [219 lb 2.2 oz (99.4 kg)] 219 lb 2.2 oz (99.4 kg) (11/06 0443)  Physical Exam: General: Intubated sedated HEENT: anicteric sclera, pupils reactive to light and accommodation, EOMI, right neck tender CVS tachycardic rate, irr irr  no murmur rubs or gallops Chest:  rhonchi Abdomen: softnondistended, normal bowel sounds, Extremities: Purpuric rash prominent especially on lower extremities Skin:  Neuro: nonfocal, intubated sedated  Lab Results:  Recent Labs  08/09/13 0425 08/10/13 0500  WBC 21.8* 35.5*  HGB 10.3* 9.8*  HCT 28.0* 27.3*  PLT 31* 51*  BMET  Recent Labs  08/09/13 0425 08/10/13 0425  NA 143 145  K 4.0 4.1  CL 101 104  CO2 22 23  GLUCOSE 184* 137*  BUN 179* 180*  CREATININE 4.95* 4.53*  CALCIUM 7.2* 7.6*    Micro Results: Recent Results (from the past 240 hour(s))  MRSA PCR SCREENING     Status: None   Collection Time    08/04/13  3:21 PM      Result Value Range Status   MRSA by PCR NEGATIVE  NEGATIVE Final   Comment:            The GeneXpert MRSA Assay (FDA     approved for NASAL specimens     only), is one component of a     comprehensive MRSA colonization     surveillance program. It is not      intended to diagnose MRSA     infection nor to guide or     monitor treatment for     MRSA infections.  CULTURE, RESPIRATORY (NON-EXPECTORATED)     Status: None   Collection Time    08/04/13  8:27 PM      Result Value Range Status   Specimen Description TRACHEAL ASPIRATE   Final   Special Requests NONE   Final   Gram Stain     Final   Value: FEW WBC PRESENT,BOTH PMN AND MONONUCLEAR     NO SQUAMOUS EPITHELIAL CELLS SEEN     NO ORGANISMS SEEN     Performed at [facility]   Culture     Final   Value: FEW YEAST CONSISTENT WITH CANDIDA SPECIES     Performed at [facility]   Report Status 08/07/2013 FINAL   Final  CULTURE, BLOOD (ROUTINE X 2)     Status: None   Collection Time    08/04/13 11:08 PM      Result Value Range Status   Specimen Description BLOOD LEFT ARM   Final   Special Requests BOTTLES DRAWN AEROBIC ONLY 2.5CC   Final   Culture  Setup Time     Final   Value: 08/05/2013 04:03     Performed at [facility]   Culture     Final   Value:        BLOOD CULTURE RECEIVED NO GROWTH TO DATE CULTURE WILL BE HELD FOR 5 DAYS BEFORE ISSUING A FINAL NEGATIVE REPORT     Performed at [facility]   Report Status PENDING   Incomplete  CULTURE, BLOOD (ROUTINE X 2)     Status: None   Collection Time    08/04/13 11:25 PM      Result Value Range Status   Specimen Description BLOOD RIGHT CENTRAL LINE   Final   Special Requests BOTTLES DRAWN AEROBIC AND ANAEROBIC 10CC EACH   Final   Culture  Setup Time     Final   Value: 08/05/2013 04:06     Performed at [facility]   Culture     Final   Value:        BLOOD CULTURE RECEIVED NO GROWTH TO DATE CULTURE WILL BE HELD FOR 5 DAYS BEFORE ISSUING A FINAL NEGATIVE REPORT     Performed at [facility]   Report Status PENDING   Incomplete  CLOSTRIDIUM DIFFICILE BY PCR     Status: None   Collection Time    08/09/13  1:40 PM      Result Value Range  Status   C difficile by pcr NEGATIVE  NEGATIVE  Final  CULTURE, RESPIRATORY (NON-EXPECTORATED)     Status: None   Collection Time    08/09/13  4:00 PM      Result Value Range Status   Specimen Description ENDOTRACHEAL   Final   Special Requests Normal   Final   Gram Stain     Final   Value: RARE WBC PRESENT, PREDOMINANTLY PMN     NO SQUAMOUS EPITHELIAL CELLS SEEN     NO ORGANISMS SEEN     Performed at [facility]   Culture PENDING   Incomplete   Report Status PENDING   Incomplete    Studies/Results: Dg Chest Port 1 View  08/10/2013   CLINICAL DATA:  Respiratory failure.  EXAM: PORTABLE CHEST - 1 VIEW  COMPARISON:  Chest x-ray 08/07/2013.  FINDINGS: An endotracheal tube is in place with tip 2.8 cm above the carina. There is a right-sided internal jugular central venous catheter with tip terminating in the mid superior vena cava. A nasogastric tube is seen extending into the stomach, however, the tip of the nasogastric tube extends below the lower margin of the image. Lung volumes have improved slightly, now near normal. Multifocal nodular opacities are again noted throughout the mid to lower lungs bilaterally, some of which appear cavitary. No definite pleural effusions decreasing airspace consolidation throughout the left lower lobe. No evidence of pulmonary edema. Heart size is normal. Mediastinal contours are unremarkable.  IMPRESSION: 1. Support apparatus, as above. 2. Improving aeration in the left lower lobe, compatible with resolving airspace consolidation. 3. Persistent multifocal nodular opacities (some of which are cavitary) throughout the mid to lower lungs bilaterally, likely to reflect multifocal septic emboli based on comparison with recent chest CT.   Electronically Signed   By: Trudie Reed M.D.   On: 08/10/2013 06:44      Assessment/Plan: Timothy Kline is a 52 y.o. 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 Now with diarrhea  and also persistent fevers.   #1 Fusobacterium Bacteremia and septic shock:CT neck S contrast without clot and Doppler without clot  --continue MERREM   #2 Multifocal PNA:  Due to Fusobacterium +/- other anerobes, perhaps GNR, GPC. Tracheal aspirate with only few yeast  CXR looks slightly improved  #3 Diarrhea: C difficile PCR negative likely due to  tube feeds  #4 Fevers: I expect these are do to his severe necrotizing pneumonia. If he were to worsen much clinically I would not be opposed to reinstituting coverage for MRSA in the form of vancomycin.  #3 Hep C; check genotype and viral load  #4 TTPEnia from DIC stablizing  #5 ARF: on  dialysis   LOS: 6 days   Acey Lav 08/10/2013, 3:43 PM
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
## 2013-08-10 NOTE — Progress Notes (Signed)
eLink Physician-Brief Progress Note Patient Name: Timothy Kline DOB: 12-13-1960 MRN: [id number]  Date of Service  08/10/2013   HPI/Events of Note   Fever if rec vanc  eICU Interventions  vanc      Nelda Bucks. 08/10/2013, 5:13 PM

## 2013-08-10 NOTE — Progress Notes (Signed)
HR 70s SBP 90 Patient due tfor scheuled lopressor 50mg  po  Plan Advised RN to hold lopressor for 1h     Dr. Kalman Shan, M.D., [facility].C.P Pulmonary and Critical Care Medicine Staff Physician [facility] Pager: [phone], If no answer or between  15:00h - 7:00h: call [phone]  08/10/2013 10:18 PM

## 2013-08-10 NOTE — Progress Notes (Signed)
S: sedated O:BP 127/80  Pulse 99  Temp(Src) 101.2 F (38.4 C) (Core (Comment))  Resp 36  Wt 99.4 kg (219 lb 2.2 oz)  SpO2 100%  Intake/Output Summary (Last 24 hours) at 08/10/13 0813 Last data filed at 08/10/13 0800  Gross per 24 hour  Intake 2182.03 ml  Output   4618 ml  Net -2435.97 ml   Weight change: 0.6 kg (1 lb 5.2 oz) ZOX:[id number], intubated CVS: RRR Resp: bilat crackles Abd:+ BS soft ND Ext: 1+ edema  Ischemic looking digits on hands and feet. Weeping from sloughed skin on forearms  Rt fem HD catheter NEURO:sedated   . antiseptic oral rinse  15 mL Mouth Rinse QID  . chlorhexidine  15 mL Mouth Rinse BID  . feeding supplement (OXEPA)  1,000 mL Per Tube Q24H  . feeding supplement (PRO-STAT SUGAR FREE 64)  60 mL Per Tube TID  . furosemide  160 mg Intravenous TID  . meropenem (MERREM) IV  500 mg Intravenous Q24H  . metoprolol tartrate  50 mg Per Tube BID  . pantoprazole (PROTONIX) IV  40 mg Intravenous QHS   Ct Soft Tissue Neck Wo Contrast  08/08/2013   CLINICAL DATA:  Sepsis. Acute renal failure.  Rule out neck abscess.  EXAM: CT NECK WITHOUT CONTRAST  TECHNIQUE: Multidetector CT imaging of the neck was performed following the standard protocol without intravenous contrast.  COMPARISON:  None.  FINDINGS: Lack of intravenous contrast to the limitation of this examination.  Right jugular central venous catheter is noted extending into the right innominate vein with tip not visualized. The right jugular vein does not appear distended or ill-defined however the study cannot exclude venous thrombosis without intravenous contrast. Ultrasound of the neck may be helpful to evaluate for jugular vein thrombosis. Superior mediastinum does not show any mass or fluid collection.  Negative for fluid collection or abscess in the neck. No mass lesion is identified. No edema in the neck.  The patient is intubated. NG tube is in place.  Mild cervical degenerative changes. No acute bony change in  the cervical spine. Extensive dental infection is noted with multiple caries and bony changes of chronic dental infection.  IMPRESSION: Right Jugular central venous catheter. No definite evidence of jugular venous thrombosis however this study is limited to evaluate for venous thrombosis without intravenous contrast. Consider followup neck ultrasound.  Negative for mass, edema, or abscess in the neck.   Electronically Signed   By: Marlan Palau M.D.   On: 08/08/2013 15:29   Dg Chest Port 1 View  08/10/2013   CLINICAL DATA:  Respiratory failure.  EXAM: PORTABLE CHEST - 1 VIEW  COMPARISON:  Chest x-ray 08/07/2013.  FINDINGS: An endotracheal tube is in place with tip 2.8 cm above the carina. There is a right-sided internal jugular central venous catheter with tip terminating in the mid superior vena cava. A nasogastric tube is seen extending into the stomach, however, the tip of the nasogastric tube extends below the lower margin of the image. Lung volumes have improved slightly, now near normal. Multifocal nodular opacities are again noted throughout the mid to lower lungs bilaterally, some of which appear cavitary. No definite pleural effusions decreasing airspace consolidation throughout the left lower lobe. No evidence of pulmonary edema. Heart size is normal. Mediastinal contours are unremarkable.  IMPRESSION: 1. Support apparatus, as above. 2. Improving aeration in the left lower lobe, compatible with resolving airspace consolidation. 3. Persistent multifocal nodular opacities (some of which are cavitary) throughout the  mid to lower lungs bilaterally, likely to reflect multifocal septic emboli based on comparison with recent chest CT.   Electronically Signed   By: Trudie Reed M.D.   On: 08/10/2013 06:44   BMET    Component Value Date/Time   NA 145 08/10/2013 0425   K 4.1 08/10/2013 0425   CL 104 08/10/2013 0425   CO2 23 08/10/2013 0425   GLUCOSE 137* 08/10/2013 0425   BUN 180* 08/10/2013 0425    CREATININE 4.53* 08/10/2013 0425   CALCIUM 7.6* 08/10/2013 0425   GFRNONAA 14* 08/10/2013 0425   GFRAA 16* 08/10/2013 0425   CBC    Component Value Date/Time   WBC 35.5* 08/10/2013 0500   RBC 3.23* 08/10/2013 0500   RBC 3.54* 08/06/2013 0419   HGB 9.8* 08/10/2013 0500   HCT 27.3* 08/10/2013 0500   PLT 51* 08/10/2013 0500   MCV 84.5 08/10/2013 0500   MCH 30.3 08/10/2013 0500   MCHC 35.9 08/10/2013 0500   RDW 16.5* 08/10/2013 0500   LYMPHSABS 0.6* 08/07/2013 0400   MONOABS 0.9 08/07/2013 0400   EOSABS 0.0 08/07/2013 0400   BASOSABS 0.1 08/07/2013 0400     Assessment:  1. AKI, oliguric presumably sec to sepsis. UO improving  ADAMTS pending 2. VDRF 3. + Hep C 4.  Thrombocytopenia presumably sec to sepsis Vs TTP, PLT count is rising 5. Afib/flutter on Amio, now NSR  Plan: 1.Cont IV lasix 2. Plan HD today as waste products high.  ? If renal fx trying to improve as UO improving 3. Recheck labs in am    Farrie Sann T

## 2013-08-11 ENCOUNTER — Inpatient Hospital Stay (HOSPITAL_COMMUNITY): Payer: BC Managed Care – PPO

## 2013-08-11 DIAGNOSIS — L089 Local infection of the skin and subcutaneous tissue, unspecified: Secondary | ICD-10-CM

## 2013-08-11 LAB — CBC WITH DIFFERENTIAL/PLATELET
Basophils Absolute: 0 10*3/uL (ref 0.0–0.1)
Basophils Relative: 0 % (ref 0–1)
Eosinophils Absolute: 0.3 10*3/uL (ref 0.0–0.7)
HCT: 23 % — ABNORMAL LOW (ref 39.0–52.0)
Lymphocytes Relative: 5 % — ABNORMAL LOW (ref 12–46)
MCH: 30.2 pg (ref 26.0–34.0)
MCHC: 33.9 g/dL (ref 30.0–36.0)
Monocytes Absolute: 0.6 10*3/uL (ref 0.1–1.0)
Neutro Abs: 27.5 10*3/uL — ABNORMAL HIGH (ref 1.7–7.7)
Neutrophils Relative %: 92 % — ABNORMAL HIGH (ref 43–77)
Platelets: 46 10*3/uL — ABNORMAL LOW (ref 150–400)
RDW: 17.1 % — ABNORMAL HIGH (ref 11.5–15.5)
WBC: 29.9 10*3/uL — ABNORMAL HIGH (ref 4.0–10.5)

## 2013-08-11 LAB — CULTURE, BLOOD (ROUTINE X 2)
Culture: NO GROWTH
Culture: NO GROWTH

## 2013-08-11 LAB — GLUCOSE, CAPILLARY
Glucose-Capillary: 116 mg/dL — ABNORMAL HIGH (ref 70–99)
Glucose-Capillary: 106 mg/dL — ABNORMAL HIGH (ref 70–99)

## 2013-08-11 LAB — COMPREHENSIVE METABOLIC PANEL
ALT: 13 U/L (ref 0–53)
Albumin: 1.4 g/dL — ABNORMAL LOW (ref 3.5–5.2)
Alkaline Phosphatase: 69 U/L (ref 39–117)
BUN: 164 mg/dL — ABNORMAL HIGH (ref 6–23)
CO2: 22 mEq/L (ref 19–32)
Chloride: 105 mEq/L (ref 96–112)
GFR calc Af Amer: 18 mL/min — ABNORMAL LOW (ref >90)
GFR calc non Af Amer: 15 mL/min — ABNORMAL LOW (ref >90)
Glucose, Bld: 156 mg/dL — ABNORMAL HIGH (ref 70–99)
Potassium: 3.7 mEq/L (ref 3.5–5.1)
Sodium: 143 mEq/L (ref 135–145)
Total Bilirubin: 1.7 mg/dL — ABNORMAL HIGH (ref 0.3–1.2)
Total Protein: 4.6 g/dL — ABNORMAL LOW (ref 6.0–8.3)

## 2013-08-11 LAB — VANCOMYCIN, RANDOM: Vancomycin Rm: 16.4 ug/mL

## 2013-08-11 LAB — CRYOGLOBULIN

## 2013-08-11 MED ORDER — VANCOMYCIN HCL IN DEXTROSE 750-5 MG/150ML-% IV SOLN
750.0000 mg | Freq: Once | INTRAVENOUS | Status: AC
  Administered 2013-08-11: 750 mg via INTRAVENOUS
  Filled 2013-08-11: qty 150

## 2013-08-11 MED ORDER — SODIUM CHLORIDE 0.9 % IV SOLN
1.0000 g | INTRAVENOUS | Status: DC
  Administered 2013-08-11 – 2013-08-14 (×4): 1 g via INTRAVENOUS
  Filled 2013-08-11 (×4): qty 1

## 2013-08-11 NOTE — Progress Notes (Signed)
NUTRITION FOLLOW UP  Intervention:    Continue Oxepa at 30 ml/hr with Prostat 60 ml TID providing 1680 kcal (24 kcal/kg), 135 grams protein (96% protein needs), 565 ml free water.  Nutrition Dx:   Inadequate oral intake related to inability to eat as evidenced by NPO status. Ongoing.  Goals:    Initiate nutrition support within 24-48 hours of intubation. Met.  Enteral nutrition to provide 60-70% of estimated calorie needs (22-25 kcals/kg ideal body weight) and 100% of estimated protein needs, based on ASPEN guidelines for permissive underfeeding in critically ill obese individuals. Met.  Monitor:   TF tolerance/adequacy, weight trend, labs, vent status.  Assessment:   PMHx significant for substance abuse and Hep C. Admitted to [facility] on 10/28 with AMS/sepsis in setting of cocaine and oxycodone use. Pt admitted to [facility] on 10/31 as pt has now developed ARDS, acute renal failure requiring HD and thrombocytopenia. Intubated at [facility] on 10/29.  CRRT has been discontinued, receiving intermittent HD as needed per Nephrologist.  WOC RN now following for multiple wounds; Left and right toes all black eschar without fluctuance or drainage. Plantar surface and anterior areas of feet dark purple and dark red mottled, extends 10 cm. Right heel with same appearance; 6X6cm. Hands and arms appear similar to feet.    Patient remains intubated on ventilator support.  MV: 16.6 L/min Temp:Temp (24hrs), Avg:101.3 F (38.5 C), Min:97.6 F (36.4 C), Max:103.6 F (39.8 C)   Height: 5\' 8"  or 172.7 cm (estimated by RN)  Weight Status:   Wt Readings from Last 1 Encounters:  08/11/13 213 lb 13.5 oz (97 kg)  08/05/13  235 lb 0.2 oz (106.6 kg)   Re-estimated needs:  Kcal: 2675 Protein: 140 gm Fluid: per medical team  Skin: skin breakdown on feet, heels, arms, and hands.  Diet Order: NPO  TF Order: Oxepa at 30 ml/hr with Prostat 60 ml TID providing 1680 kcal (24 kcal/kg),  135 grams protein (96% protein needs), 565 ml free water.    Intake/Output Summary (Last 24 hours) at 08/11/13 1208 Last data filed at 08/11/13 0900  Gross per 24 hour  Intake 2554.28 ml  Output   3520 ml  Net -965.72 ml    Last BM: 11/7   Labs:   Recent Labs Lab 08/04/13 1622 08/05/13 0500  08/08/13 0500 08/09/13 0425 08/10/13 0425 08/11/13 0455  NA 141 140  < > 139 143 145 143  K 3.3* 4.3  < > 3.6 4.0 4.1 3.7  CL 92* 92*  < > 98 101 104 105  CO2 33* 33*  < > 24 22 23 22   BUN 77* 91*  < > 132* 179* 180* 164*  CREATININE 3.08* 3.72*  < > 4.19* 4.95* 4.53* 4.16*  CALCIUM 5.6* 6.3*  < > 7.1* 7.2* 7.6* 7.1*  MG 1.7 1.9  --   --   --   --   --   PHOS 5.0* 5.3*  --  8.4* 8.8* 8.3*  --   GLUCOSE 156* 134*  < > 184* 184* 137* 156*  < > = values in this interval not displayed.  CBG (last 3)   Recent Labs  08/10/13 1926 08/10/13 2352 08/11/13 0650  GLUCAP 159* 180* 138*    Scheduled Meds: . amiodarone  400 mg Oral BID   Followed by  . [START ON 08/16/2013] amiodarone  200 mg Oral Daily  . antiseptic oral rinse  15 mL Mouth Rinse QID  . chlorhexidine  15 mL Mouth Rinse BID  . feeding supplement (OXEPA)  1,000 mL Per Tube Q24H  . feeding supplement (PRO-STAT SUGAR FREE 64)  60 mL Per Tube TID  . furosemide  160 mg Intravenous TID  . meropenem (MERREM) IV  1 g Intravenous Q24H  . metoprolol tartrate  50 mg Per Tube BID  . pantoprazole (PROTONIX) IV  40 mg Intravenous QHS  . vancomycin  750 mg Intravenous Once    Continuous Infusions: . dexmedetomidine 1.1 mcg/kg/hr (08/11/13 1030)  . fentaNYL infusion INTRAVENOUS 50 mcg/hr (08/11/13 0800)    Joaquin Courts, RD, LDN, CNSC Pager [phone] After Hours Pager [phone]

## 2013-08-11 NOTE — Consult Note (Addendum)
WOC wound consult note Reason for Consult: Consult requested for multiple wounds.  Pt has thrombolytic vasculitis reaction and septic emboli according to progress notes. He was also on cont IV vasopressors during this ICU stay.   Pt has multiple systemic factors which will make it difficult to promote healing. Left and right toes all black eschar without fluctuance or drainage.  Plantar surface and anterior areas of feet dark purple and dark red mottled, extends 10 cm.  Bedside nurse able to obtain doppler pulses. Left heel dark purple red area 5X5cm.  Expect this will evolve into full thickness tissue loss R/T decreased circulatory status. Right heel with same appearance; 6X6cm These areas are NOT pressure ulcers, but are R/T disease process of emboli and insufficient circulation.  Hands with same appearance, fingers with eschar.  Left arm with eschar near wrist; 3X3cm. Left arm from hand towards elbow with 10cm area of red partial thickness skin loss where previous blistered skin has peeled.  Large amt yellow drainage, no odor.   Right  arm with same appearence as left but no eschar.  Right antecubital area with 1X1cm area of yellow slough, full thickness wound drained large amt thick tan pus when probed with swab.  Tunneling to 5 cm at 12:00 o'clock and 4 cm at 6:00 o'clock.  Dressing procedure/placement/frequency: Discussed plan of care with CCM team. Topical treatment will NOT be effective in treating this complex medical condition. Hand surgeon consult requested for right antecubital wound and VVS consult pending for bilat arms and feet.  Plan: Xeroform gauze to bilat arms to protect wound beds.  Prevalon boots to BLE to reduce pressure to heels.  Refer to consulting teams for further recommendations. Please re-consult if further assistance is needed.  Thank-you,  Cammie Mcgee MSN, RN, CWOCN, [place], CNS [phone]

## 2013-08-11 NOTE — Progress Notes (Signed)
PULMONARY  / CRITICAL CARE MEDICINE  Name: Timothy Kline MRN: [id number] DOB: 1961-09-04    ADMISSION DATE:  08/04/2013 CONSULTATION DATE:  10/31  REFERRING MD :  [facility] PRIMARY SERVICE: PCCM  CHIEF COMPLAINT:  Respiratory Failure/ Sepsis/ Renal Failure  BRIEF PATIENT DESCRIPTION: [name] presented to [facility] 10/28 with AMS/Sepsis in setting of cocaine /oxycodone use. Pt admitted with Respiratory Failure (ARDS)/Sepsis has developed acute renal failure, thrombocytopenia, a-fib Flutter with RVR and was transferred to [facility] on 10/31 for HD and further care. CCM asked to admit.  SIGNIFICANT EVENTS / STUDIES:  10/28 Admission to [facility] 10/29 Metabolic Acidosis, pH=6.9 10/31 Transferred to [facility] for HD. 10/31 Cardioversion for Afib/Flutter rate 170's ( Unsuccessful).  Amiodarone initiated for Afib/Flutter RVR 10/31 Renal consult: CRRT initiated 10/31 Heme consult: TCP likely due to DIC. Doubt TTP/HUS 11/01 Echocardiogram: LVEF 20-25%. Diffuse HK 11/01 ID consult 11/01 Skin biopsy: thrombotic vasculopathic reaction  11/2 tolerated HD, off pressors.  Platelet transfusion for thrombocytopenia  11/3 CXR with new pulmonary opacities/nodules.  Concern for septic emboli.  11/03 CT chest: Multilobar bilateral areas of pulmonary parenchymal nodular consolidation with internal necrosis/ cavitation and suggestion of surrounding hazy opacity. The appearance is most typical for septic emboli 11/03 CT head: NAD 11/03 BCx from [place] 2/2 + for FUSOBACTERIUM NECROPHORUM.   11/04 CT neck (noncontrasted): No evidence of jugular vein thrombosis, abscesses, inflammation.   11/05 Jugular vein [place]: No thrombus noted 11/04 Converted to NSR from Afib/Aflutter  11/05 Dexmedetomidine initiated in anticipation of extubation soon.   11/06 C Diff negative, Tracheal aspirate few yeast  11/06 Continued fevers, restart vanc for possible MRSA PNA 11/07 Sinus Tract R antecubital arm  secondary to punch bx discovered  11/07 Wound Cx of R arm and MRI of R arm:   LINES / TUBES: R IJ CVL 10/28 >>  ETT 10/29 >>  R femoral HD Cath 11/01 >>   CULTURES: [facility] >> + Fusobacterium Necrophorum BCx2 10/31>>> Negative Sputum 10/31>> Few yeast Tracheal Asp. 11/05>> Rare WBC, no organisms  C Diff 11/5 >> Negative  Wound Cx R antecubital Fossa 11/7 >>   ANTIBIOTICS: Rocephin: 10/28?>>>10/31 Levoquin:   10/28>>>10/31 Vancomycin 10/28>>> 11/3 Cefepime 10/31>>> 11/3 Flagyl 11/2 >> 11/3  Meropenem 11/3 >>  Vancomycin 11/6 >>   VITAL SIGNS: Temp:  [97.6 F (36.4 C)-103.6 F (39.8 C)] 100.5 F (38.1 C) (11/07 0830) Pulse Rate:  [41-109] 66 (11/07 0830) Resp:  [16-41] 19 (11/07 0830) BP: (74-126)/(46-84) 102/58 mmHg (11/07 0800) SpO2:  [99 %-100 %] 100 % (11/07 0830) FiO2 (%):  [40 %] 40 % (11/07 0800) Weight:  [213 lb 13.5 oz (97 kg)] 213 lb 13.5 oz (97 kg) (11/07 0450)  HEMODYNAMICS:    VENTILATOR SETTINGS: Vent Mode:  [-] PCV FiO2 (%):  [40 %] 40 % Set Rate:  [16 bmp] 16 bmp PEEP:  [5 cmH20] 5 cmH20 Plateau Pressure:  [16 cmH20-23 cmH20] 20 cmH20  INTAKE / OUTPUT: Intake/Output     11/06 0701 - 11/07 0700 11/07 0701 - 11/08 0700   I.V. (mL/kg) 1298.6 (13.4)    NG/GT 1110    IV Piggyback 398    Total Intake(mL/kg) 2806.6 (28.9)    Urine (mL/kg/hr) 2620 (1.1)    Other -100 (-0)    Stool 1550 (0.7)    Total Output 4070     Net -1263.4           PHYSICAL EXAMINATION: General: sedated on vent HEENT: NCAT,  ETT in place PULM: Occasional wheezing B/L  CV: RRR Ab: Soft, NABS Derm: Necrotic gangrenous extremities B/L UE/LE, blistering on arms. + 1 x 1 cm antecubital sinus tract with purulent draining material  Neuro: following occasional commands, intubated   LABS/IMGAING: I personally reviewed the lab work from the past 24 hours in comparison to previous and the relevant findings can be found in the A/P.   08/09/13 - US neck/duplex -  Negative for thrombosis   08/10/13 - CXR showing improving aeration in LLL, consistent nodular opacities    ASSESSMENT / PLAN:  PULMONARY A:  Acute Respiratory Failure, VDRF Suspect COPD Septic pulmonary emboli P:   Vent settings reviewed, wean if not to OR for antecubital wound  Daily SBT as indicated Cont BDs Cont Precedex   CARDIOVASCULAR A:  Septic shock, resolved AFRVR - Converted 08/08/13 Cardiomyopathy (EF 20-25%) Digital Dry Gangrene  P:  Continue amiodarone 400 mg BID enteral, titrate per pharmacy Continue schedule enteral Metoprolol as HR/BP tolerate, PRN IV metoprolol for HR > 115/min Consider repeat Echo prior to discharge  RENAL A:   Acute Renal Failure > 11/1 HD start Metabolic Acidosis, resolved Oliguria, resolved P:  Renal following, HD as needed   Continue Lasix  Monitor BMET intermittently Correct electrolytes as indicated  GASTROINTESTINAL A:   Hypoalbuminemia Protein-calorie malnutrition P:   Cont TFs @ 45 kcal/hr   HEMATOLOGIC A:   Mild anemia DIC  Severe thrombocytopenia - Stable P:  CBC improving after restarting Vanc Platelets are stable  Avoid plt transfusion unless actively bleeding  INFECTIOUS A:   Severe sepsis - Resolved  R Antecubital Sinus Tract secondary to punch Bx MRSA PNA? 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 Septic pulmonary emboli P:   Pt with 1 x 1 cm sinus tract with full thickness wound secondary to punch Bx Will get Wound Cx of area and MRI of R arm w/o contrast to evaluate for fasciitis vs osteo Consider Hand Surgery pending results of MRI  Hep C viral load/genotype pending  Abx per ID  Restart Vanc secondary to fevers, possible MRSA PNA Noncontrasted CT neck negative for thrombosis  [place] of B/L Jugular Veins negative for thrombosis   ENDOCRINE A: Hyperglycemia without prior dx of DM, stress related P:   CBG q 6 hrs, call > 200   NEUROLOGIC A:  Acute  encephalopathy - Resolving  Chronic pain P:   Continue Precedex, Versed and fentanyl PRN  Continue intubation pending possible OR.   Daily WUA    45 mins CCM time. Daughter updated in detail.   Billy Fischer, MD ; [facility] [phone].  After 5:30 PM or weekends, call [phone]   Seen in conjunction with: Twana First. Paulina Fusi, DO of [facility] 08/11/2013, 8:33 AM
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
## 2013-08-11 NOTE — Progress Notes (Signed)
S: sedated O:BP 94/56  Pulse 41  Temp(Src) 100 F (37.8 C) (Core (Comment))  Resp 30  Wt 97 kg (213 lb 13.5 oz)  SpO2 100%  Intake/Output Summary (Last 24 hours) at 08/11/13 0759 Last data filed at 08/11/13 0700  Gross per 24 hour  Intake 2806.63 ml  Output   4070 ml  Net -1263.37 ml   Weight change: -3.2 kg (-7 lb 0.9 oz) UEA:[id number], intubated CVS: RRR Resp: clear ant Abd:+ BS soft ND Ext: 1+ edema  Ischemic looking digits on hands and feet. Weeping from sloughed skin on forearms  Rt fem HD catheter NEURO:sedated but does nod head to his name   . amiodarone  400 mg Oral BID   Followed by  . [START ON 08/16/2013] amiodarone  200 mg Oral Daily  . antiseptic oral rinse  15 mL Mouth Rinse QID  . chlorhexidine  15 mL Mouth Rinse BID  . feeding supplement (OXEPA)  1,000 mL Per Tube Q24H  . feeding supplement (PRO-STAT SUGAR FREE 64)  60 mL Per Tube TID  . furosemide  160 mg Intravenous TID  . meropenem (MERREM) IV  500 mg Intravenous Q24H  . metoprolol tartrate  50 mg Per Tube BID  . pantoprazole (PROTONIX) IV  40 mg Intravenous QHS   Dg Chest Port 1 View  08/10/2013   CLINICAL DATA:  Respiratory failure.  EXAM: PORTABLE CHEST - 1 VIEW  COMPARISON:  Chest x-ray 08/07/2013.  FINDINGS: An endotracheal tube is in place with tip 2.8 cm above the carina. There is a right-sided internal jugular central venous catheter with tip terminating in the mid superior vena cava. A nasogastric tube is seen extending into the stomach, however, the tip of the nasogastric tube extends below the lower margin of the image. Lung volumes have improved slightly, now near normal. Multifocal nodular opacities are again noted throughout the mid to lower lungs bilaterally, some of which appear cavitary. No definite pleural effusions decreasing airspace consolidation throughout the left lower lobe. No evidence of pulmonary edema. Heart size is normal. Mediastinal contours are unremarkable.  IMPRESSION: 1.  Support apparatus, as above. 2. Improving aeration in the left lower lobe, compatible with resolving airspace consolidation. 3. Persistent multifocal nodular opacities (some of which are cavitary) throughout the mid to lower lungs bilaterally, likely to reflect multifocal septic emboli based on comparison with recent chest CT.   Electronically Signed   By: Trudie Reed M.D.   On: 08/10/2013 06:44   BMET    Component Value Date/Time   NA 143 08/11/2013 0455   K 3.7 08/11/2013 0455   CL 105 08/11/2013 0455   CO2 22 08/11/2013 0455   GLUCOSE 156* 08/11/2013 0455   BUN 164* 08/11/2013 0455   CREATININE 4.16* 08/11/2013 0455   CALCIUM 7.1* 08/11/2013 0455   GFRNONAA 15* 08/11/2013 0455   GFRAA 18* 08/11/2013 0455   CBC    Component Value Date/Time   WBC 29.9* 08/11/2013 0455   RBC 2.58* 08/11/2013 0455   RBC 3.54* 08/06/2013 0419   HGB 7.8* 08/11/2013 0455   HCT 23.0* 08/11/2013 0455   PLT 46* 08/11/2013 0455   MCV 89.1 08/11/2013 0455   MCH 30.2 08/11/2013 0455   MCHC 33.9 08/11/2013 0455   RDW 17.1* 08/11/2013 0455   LYMPHSABS 1.5 08/11/2013 0455   MONOABS 0.6 08/11/2013 0455   EOSABS 0.3 08/11/2013 0455   BASOSABS 0.0 08/11/2013 0455     Assessment:  1. AKI, non-oliguric presumably sec to sepsis.  UO improving   2. VDRF 3. + Hep C 4.  Thrombocytopenia presumably sec to sepsis  5. Afib/flutter on Amio, now NSR  Plan: 1.UO remains good, will hold off on HD today.  IScr/BUN higher in AM then will dialyze. 2. ADAMTS level low at 36 but not low enough for TTP 3. Recheck labs in AM  Jenniferann Stuckert T

## 2013-08-11 NOTE — Progress Notes (Addendum)
[facility]    Day # 5  Meropenem Day # 2 vancomycin  Subjective: Intubated sedated   Antibiotics:  Anti-infectives   Start     Dose/Rate Route Frequency Ordered Stop   08/11/13 1830  meropenem (MERREM) 1 g in sodium chloride 0.9 % 100 mL IVPB     1 g 200 mL/hr over 30 Minutes Intravenous Every 24 hours 08/11/13 1145     08/11/13 1100  vancomycin (VANCOCIN) IVPB 750 mg/150 ml premix     750 mg 150 mL/hr over 60 Minutes Intravenous  Once 08/11/13 1026 08/11/13 1321   08/10/13 1745  vancomycin (VANCOCIN) IVPB 750 mg/150 ml premix     750 mg 150 mL/hr over 60 Minutes Intravenous  Once 08/10/13 1734 08/10/13 1920   08/07/13 1830  meropenem (MERREM) 500 mg in sodium chloride 0.9 % 50 mL IVPB  Status:  Discontinued     500 mg 100 mL/hr over 30 Minutes Intravenous Every 24 hours 08/07/13 1745 08/11/13 1145   08/06/13 2000  vancomycin (VANCOCIN) IVPB 750 mg/150 ml premix     750 mg 150 mL/hr over 60 Minutes Intravenous  Once 08/06/13 1056 08/07/13 0330   08/05/13 2200  ceFEPIme (MAXIPIME) 1 g in dextrose 5 % 50 mL IVPB  Status:  Discontinued     1 g 100 mL/hr over 30 Minutes Intravenous Every 24 hours 08/04/13 1815 08/07/13 1656   08/05/13 1200  metroNIDAZOLE (FLAGYL) IVPB 500 mg  Status:  Discontinued     500 mg 100 mL/hr over 60 Minutes Intravenous Every 8 hours 08/05/13 1049 08/07/13 1656   08/04/13 1630  ceFEPIme (MAXIPIME) 2 g in dextrose 5 % 50 mL IVPB     2 g 100 mL/hr over 30 Minutes Intravenous  Once 08/04/13 1604 08/04/13 1747      Medications: Scheduled Meds: . amiodarone  400 mg Oral BID   Followed by  . [START ON 08/16/2013] amiodarone  200 mg Oral Daily  . antiseptic oral rinse  15 mL Mouth Rinse QID  . chlorhexidine  15 mL Mouth Rinse BID  . feeding supplement (OXEPA)  1,000 mL Per Tube Q24H  . feeding supplement (PRO-STAT SUGAR FREE 64)  60 mL Per Tube TID  . furosemide  160 mg Intravenous TID  . meropenem (MERREM) IV  1 g  Intravenous Q24H  . metoprolol tartrate  50 mg Per Tube BID  . pantoprazole (PROTONIX) IV  40 mg Intravenous QHS   Continuous Infusions: . dexmedetomidine 1.1 mcg/kg/hr (08/11/13 1500)  . fentaNYL infusion INTRAVENOUS 75 mcg/hr (08/11/13 1500)   PRN Meds:.sodium chloride, sodium chloride, sodium chloride, sodium chloride, sodium chloride, acetaminophen (TYLENOL) oral liquid 160 mg/5 mL, albuterol, anticoagulant sodium citrate, fentaNYL, heparin, influenza vac split quadrivalent PF, lidocaine (PF), lidocaine-prilocaine, metoprolol, midazolam, pentafluoroprop-tetrafluoroeth, pneumococcal 23 valent vaccine   Objective: Weight change: -7 lb 0.9 oz (-3.2 kg)  Intake/Output Summary (Last 24 hours) at 08/11/13 1558 Last data filed at 08/11/13 1500  Gross per 24 hour  Intake 2872.28 ml  Output   4120 ml  Net -1247.72 ml   Blood pressure 96/53, pulse 77, temperature 100.9 F (38.3 C), temperature source Core (Comment), resp. rate 16, weight 213 lb 13.5 oz (97 kg), SpO2 100.00%. Temp:  [97.6 F (36.4 C)-103.6 F (39.8 C)] 100.9 F (38.3 C) (11/07 1500) Pulse Rate:  [41-105] 77 (11/07 1500) Resp:  [16-41] 16 (11/07 1500) BP: (74-126)/(46-84) 96/53 mmHg (11/07 1500) SpO2:  [93 %-100 %]  100 % (11/07 1500) FiO2 (%):  [40 %] 40 % (11/07 1533) Weight:  [213 lb 13.5 oz (97 kg)] 213 lb 13.5 oz (97 kg) (11/07 0450)  Physical Exam: General: Intubated actually tracked later in pm when I examined him for 2nd time with Dr Sung Amabile HEENT: anicteric sclera, pupils reactive to light and accommodation, EOMI, right neck tender CVS tachycardic rate, irr irr  no murmur rubs or gallops Chest:  rhonchi Abdomen: softnondistended, ? Tenderness to palpation of abdomennormal bowel sounds, Rectal pouch with copious brown stool Extremities: Purpuric rash prominent especially on lower extremities w necrotic changes  RUE: examined with Dr. Sung Amabile and pt has > finger-breadth open fistulating area on arm with  brown-yellow copious purulent material from wound   Neuro: nonfocal, intubated sedated  Lab Results:  Recent Labs  08/10/13 0500 08/11/13 0455  WBC 35.5* 29.9*  HGB 9.8* 7.8*  HCT 27.3* 23.0*  PLT 51* 46*    BMET  Recent Labs  08/10/13 0425 08/11/13 0455  NA 145 143  K 4.1 3.7  CL 104 105  CO2 23 22  GLUCOSE 137* 156*  BUN 180* 164*  CREATININE 4.53* 4.16*  CALCIUM 7.6* 7.1*    Micro Results: Recent Results (from the past 240 hour(s))  MRSA PCR SCREENING     Status: None   Collection Time    08/04/13  3:21 PM      Result Value Range Status   MRSA by PCR NEGATIVE  NEGATIVE Final   Comment:            The GeneXpert MRSA Assay (FDA     approved for NASAL specimens     only), is one component of a     comprehensive MRSA colonization     surveillance program. It is not     intended to diagnose MRSA     infection nor to guide or     monitor treatment for     MRSA infections.  CULTURE, RESPIRATORY (NON-EXPECTORATED)     Status: None   Collection Time    08/04/13  8:27 PM      Result Value Range Status   Specimen Description TRACHEAL ASPIRATE   Final   Special Requests NONE   Final   Gram Stain     Final   Value: FEW WBC PRESENT,BOTH PMN AND MONONUCLEAR     NO SQUAMOUS EPITHELIAL CELLS SEEN     NO ORGANISMS SEEN     Performed at [facility]   Culture     Final   Value: FEW YEAST CONSISTENT WITH CANDIDA SPECIES     Performed at [facility]   Report Status 08/07/2013 FINAL   Final  CULTURE, BLOOD (ROUTINE X 2)     Status: None   Collection Time    08/04/13 11:08 PM      Result Value Range Status   Specimen Description BLOOD LEFT ARM   Final   Special Requests BOTTLES DRAWN AEROBIC ONLY 2.5CC   Final   Culture  Setup Time     Final   Value: 08/05/2013 04:03     Performed at [facility]   Culture     Final   Value: NO GROWTH 5 DAYS     Performed at [facility]   Report Status 08/11/2013 FINAL   Final  CULTURE, BLOOD  (ROUTINE X 2)     Status: None   Collection Time    08/04/13 11:25 PM  Result Value Range Status   Specimen Description BLOOD RIGHT CENTRAL LINE   Final   Special Requests BOTTLES DRAWN AEROBIC AND ANAEROBIC 10CC EACH   Final   Culture  Setup Time     Final   Value: 08/05/2013 04:06     Performed at [facility]   Culture     Final   Value: NO GROWTH 5 DAYS     Performed at [facility]   Report Status 08/11/2013 FINAL   Final  CLOSTRIDIUM DIFFICILE BY PCR     Status: None   Collection Time    08/09/13  1:40 PM      Result Value Range Status   C difficile by pcr NEGATIVE  NEGATIVE Final  CULTURE, RESPIRATORY (NON-EXPECTORATED)     Status: None   Collection Time    08/09/13  4:00 PM      Result Value Range Status   Specimen Description ENDOTRACHEAL   Final   Special Requests Normal   Final   Gram Stain     Final   Value: RARE WBC PRESENT, PREDOMINANTLY PMN     NO SQUAMOUS EPITHELIAL CELLS SEEN     NO ORGANISMS SEEN     Performed at [facility]   Culture     Final   Value: FEW YEAST CONSISTENT WITH CANDIDA SPECIES     Performed at [facility]   Report Status PENDING   Incomplete    Studies/Results: Dg Chest Port 1 View  08/10/2013   CLINICAL DATA:  Respiratory failure.  EXAM: PORTABLE CHEST - 1 VIEW  COMPARISON:  Chest x-ray 08/07/2013.  FINDINGS: An endotracheal tube is in place with tip 2.8 cm above the carina. There is a right-sided internal jugular central venous catheter with tip terminating in the mid superior vena cava. A nasogastric tube is seen extending into the stomach, however, the tip of the nasogastric tube extends below the lower margin of the image. Lung volumes have improved slightly, now near normal. Multifocal nodular opacities are again noted throughout the mid to lower lungs bilaterally, some of which appear cavitary. No definite pleural effusions decreasing airspace consolidation throughout the left lower lobe. No evidence  of pulmonary edema. Heart size is normal. Mediastinal contours are unremarkable.  IMPRESSION: 1. Support apparatus, as above. 2. Improving aeration in the left lower lobe, compatible with resolving airspace consolidation. 3. Persistent multifocal nodular opacities (some of which are cavitary) throughout the mid to lower lungs bilaterally, likely to reflect multifocal septic emboli based on comparison with recent chest CT.   Electronically Signed   By: Trudie Reed M.D.   On: 08/10/2013 06:44      Assessment/Plan: Timothy Kline is a 52 y.o. male with   Hx of IVDU, Hepatitis C admitted with septic shock from FUSOBACTERIUM NECROPHORUM and with multifocal cavitary pneumonia, also with a fistulating PURULENT soft tissue infection +/- deep infection of the arm   #1 FISTULATING soft tissue +/- deeper abscess osteo/septic joint Right arm: --agree with MRI OF RIGHT ELBOW --agree with ORTHO, HAND consultation --very good reason to have vancomcycin back on board with meropenem   #2 Fusobacterium Bacteremia and septic shock:CT neck S contrast without clot and Doppler without clot  --continue MERREM   #2 Multifocal PNA:  Due to Fusobacterium +/- other anerobes, perhaps GNR, GPC. 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 Orvan Falconer will check on patient over the weekend.    LOS: 7 days   Acey Lav 08/11/2013, 3:58 PM

## 2013-08-12 ENCOUNTER — Encounter (HOSPITAL_COMMUNITY)
Admission: AD | Disposition: A | Discharge status: Skilled Nursing Facility | Payer: Self-pay | Source: Other Acute Inpatient Hospital | Attending: Internal Medicine

## 2013-08-12 ENCOUNTER — Inpatient Hospital Stay (HOSPITAL_COMMUNITY): Payer: BC Managed Care – PPO | Admitting: Anesthesiology

## 2013-08-12 ENCOUNTER — Encounter (HOSPITAL_COMMUNITY): Payer: BC Managed Care – PPO | Admitting: Anesthesiology

## 2013-08-12 DIAGNOSIS — IMO0002 Reserved for concepts with insufficient information to code with codable children: Secondary | ICD-10-CM

## 2013-08-12 DIAGNOSIS — J852 Abscess of lung without pneumonia: Secondary | ICD-10-CM

## 2013-08-12 DIAGNOSIS — A414 Sepsis due to anaerobes: Secondary | ICD-10-CM

## 2013-08-12 HISTORY — PX: I&D EXTREMITY: SHX5045

## 2013-08-12 LAB — RENAL FUNCTION PANEL
BUN: 193 mg/dL — ABNORMAL HIGH (ref 6–23)
Calcium: 7 mg/dL — ABNORMAL LOW (ref 8.4–10.5)
Creatinine, Ser: 4.55 mg/dL — ABNORMAL HIGH (ref 0.50–1.35)
GFR calc Af Amer: 16 mL/min — ABNORMAL LOW (ref >90)
GFR calc non Af Amer: 14 mL/min — ABNORMAL LOW (ref >90)
Glucose, Bld: 137 mg/dL — ABNORMAL HIGH (ref 70–99)
Phosphorus: 10.4 mg/dL — ABNORMAL HIGH (ref 2.3–4.6)
Sodium: 147 mEq/L — ABNORMAL HIGH (ref 135–145)

## 2013-08-12 LAB — CBC
HCT: 21.7 % — ABNORMAL LOW (ref 39.0–52.0)
Hemoglobin: 7.4 g/dL — ABNORMAL LOW (ref 13.0–17.0)
MCH: 30.5 pg (ref 26.0–34.0)
MCHC: 34.1 g/dL (ref 30.0–36.0)
RDW: 17.7 % — ABNORMAL HIGH (ref 11.5–15.5)

## 2013-08-12 LAB — CULTURE, RESPIRATORY W GRAM STAIN

## 2013-08-12 LAB — GLUCOSE, CAPILLARY
Glucose-Capillary: 131 mg/dL — ABNORMAL HIGH (ref 70–99)
Glucose-Capillary: 150 mg/dL — ABNORMAL HIGH (ref 70–99)

## 2013-08-12 SURGERY — IRRIGATION AND DEBRIDEMENT EXTREMITY
Anesthesia: General | Site: Arm Lower | Laterality: Right | Wound class: Dirty or Infected

## 2013-08-12 MED ORDER — NOREPINEPHRINE BITARTRATE 1 MG/ML IJ SOLN
2.0000 ug/min | INTRAVENOUS | Status: DC
  Administered 2013-08-12: 4 ug/min via INTRAVENOUS
  Administered 2013-08-13: 6 ug/min via INTRAVENOUS
  Filled 2013-08-12 (×2): qty 4

## 2013-08-12 MED ORDER — BUPIVACAINE HCL (PF) 0.25 % IJ SOLN
INTRAMUSCULAR | Status: DC | PRN
  Administered 2013-08-12: 20 mL

## 2013-08-12 MED ORDER — SODIUM CHLORIDE 0.9 % IR SOLN
Status: DC | PRN
  Administered 2013-08-12: 17:00:00

## 2013-08-12 MED ORDER — MIDAZOLAM HCL 2 MG/2ML IJ SOLN
INTRAMUSCULAR | Status: AC
  Administered 2013-08-12: 2 mg via INTRAVENOUS
  Filled 2013-08-12: qty 2

## 2013-08-12 MED ORDER — SODIUM CHLORIDE 0.9 % IV SOLN
INTRAVENOUS | Status: DC | PRN
  Administered 2013-08-12: 16:00:00 via INTRAVENOUS

## 2013-08-12 MED ORDER — MIDAZOLAM HCL 2 MG/2ML IJ SOLN
2.0000 mg | Freq: Once | INTRAMUSCULAR | Status: AC
  Administered 2013-08-12: 2 mg via INTRAVENOUS
  Filled 2013-08-12: qty 2

## 2013-08-12 MED ORDER — VANCOMYCIN HCL IN DEXTROSE 1-5 GM/200ML-% IV SOLN
1000.0000 mg | Freq: Once | INTRAVENOUS | Status: AC
  Administered 2013-08-12: 1000 mg via INTRAVENOUS
  Filled 2013-08-12: qty 200

## 2013-08-12 MED ORDER — BUPIVACAINE HCL (PF) 0.25 % IJ SOLN
INTRAMUSCULAR | Status: AC
  Filled 2013-08-12: qty 30

## 2013-08-12 MED ORDER — SODIUM CHLORIDE 0.9 % IR SOLN
Status: DC | PRN
  Administered 2013-08-12: 3000 mL

## 2013-08-12 SURGICAL SUPPLY — 41 items
BAG DECANTER FOR FLEXI CONT (MISCELLANEOUS) ×2 IMPLANT
BANDAGE ELASTIC 3 VELCRO ST LF (GAUZE/BANDAGES/DRESSINGS) IMPLANT
BANDAGE ELASTIC 4 VELCRO ST LF (GAUZE/BANDAGES/DRESSINGS) ×1 IMPLANT
BANDAGE GAUZE ELAST BULKY 4 IN (GAUZE/BANDAGES/DRESSINGS) ×2 IMPLANT
BNDG COHESIVE 6X5 TAN STRL LF (GAUZE/BANDAGES/DRESSINGS) ×1 IMPLANT
CLOTH BEACON ORANGE TIMEOUT ST (SAFETY) ×1 IMPLANT
CORDS BIPOLAR (ELECTRODE) IMPLANT
CUFF TOURNIQUET SINGLE 18IN (TOURNIQUET CUFF) ×1 IMPLANT
DRAPE INCISE IOBAN 66X45 STRL (DRAPES) ×1 IMPLANT
DRAPE SURG 17X23 STRL (DRAPES) ×2 IMPLANT
DRSG PAD ABDOMINAL 8X10 ST (GAUZE/BANDAGES/DRESSINGS) ×1 IMPLANT
ELECT REM PT RETURN 9FT ADLT (ELECTROSURGICAL)
ELECTRODE REM PT RTRN 9FT ADLT (ELECTROSURGICAL) IMPLANT
GAUZE PACKING IODOFORM 1/4X5 (PACKING) IMPLANT
GAUZE XEROFORM 1X8 LF (GAUZE/BANDAGES/DRESSINGS) ×1 IMPLANT
GLOVE BIOGEL M STRL SZ7.5 (GLOVE) ×3 IMPLANT
GOWN STRL NON-REIN LRG LVL3 (GOWN DISPOSABLE) ×4 IMPLANT
HANDPIECE INTERPULSE COAX TIP (DISPOSABLE) ×2
KIT BASIN OR (CUSTOM PROCEDURE TRAY) ×2 IMPLANT
KIT ROOM TURNOVER OR (KITS) ×2 IMPLANT
MANIFOLD NEPTUNE II (INSTRUMENTS) ×2 IMPLANT
NDL HYPO 25GX1X1/2 BEV (NEEDLE) IMPLANT
NEEDLE HYPO 25GX1X1/2 BEV (NEEDLE) ×2 IMPLANT
NS IRRIG 1000ML POUR BTL (IV SOLUTION) ×2 IMPLANT
PACK ORTHO EXTREMITY (CUSTOM PROCEDURE TRAY) ×2 IMPLANT
PAD ARMBOARD 7.5X6 YLW CONV (MISCELLANEOUS) ×4 IMPLANT
PAD CAST 4YDX4 CTTN HI CHSV (CAST SUPPLIES) IMPLANT
PADDING CAST COTTON 4X4 STRL (CAST SUPPLIES)
SET HNDPC FAN SPRY TIP SCT (DISPOSABLE) IMPLANT
SOAP 2 % CHG 4 OZ (WOUND CARE) ×2 IMPLANT
SPONGE GAUZE 4X4 12PLY (GAUZE/BANDAGES/DRESSINGS) ×1 IMPLANT
SPONGE LAP 18X18 X RAY DECT (DISPOSABLE) ×1 IMPLANT
SPONGE LAP 4X18 X RAY DECT (DISPOSABLE) ×2 IMPLANT
SUT VICRYL AB 4 0 18 (SUTURE) ×1 IMPLANT
SYR CONTROL 10ML LL (SYRINGE) ×1 IMPLANT
TOWEL OR 17X24 6PK STRL BLUE (TOWEL DISPOSABLE) ×2 IMPLANT
TOWEL OR 17X26 10 PK STRL BLUE (TOWEL DISPOSABLE) ×2 IMPLANT
TUBE ANAEROBIC SPECIMEN COL (MISCELLANEOUS) IMPLANT
TUBE CONNECTING 12X1/4 (SUCTIONS) ×2 IMPLANT
WATER STERILE IRR 1000ML POUR (IV SOLUTION) ×1 IMPLANT
YANKAUER SUCT BULB TIP NO VENT (SUCTIONS) ×2 IMPLANT

## 2013-08-12 NOTE — Progress Notes (Signed)
Pt back from OR

## 2013-08-12 NOTE — Brief Op Note (Signed)
08/04/2013 - 08/12/2013  5:08 PM  PATIENT:  Timothy Kline  52 y.o. male  PRE-OPERATIVE DIAGNOSIS:  draining wound right arm  POST-OPERATIVE DIAGNOSIS:  draining wound right arm  PROCEDURE:  Procedure(s): IRRIGATION AND DEBRIDEMENT RIGHT  ARM (Right)  SURGEON:  Surgeon(s) and Role:    * Knute Neu, MD - Primary  PHYSICIAN ASSISTANT:   ASSISTANTS: none   ANESTHESIA:   general  EBL:  Total I/O In: 1000.7 [I.V.:550.7; NG/GT:450] Out: 1002 [Urine:1095; Stool:400]  BLOOD ADMINISTERED:none  DRAINS: none   LOCAL MEDICATIONS USED:  MARCAINE    and BUPIVICAINE   SPECIMEN:  No Specimen  DISPOSITION OF SPECIMEN:  N/A  COUNTS:  YES  TOURNIQUET:  * No tourniquets in log *  DICTATION: .Note written in EPIC  PLAN OF CARE: return to ICU  PATIENT DISPOSITION:  ICU - intubated and critically ill.   Delay start of Pharmacological VTE agent (>24hrs) due to surgical blood loss or risk of bleeding: not applicable

## 2013-08-12 NOTE — Anesthesia Postprocedure Evaluation (Signed)
  Anesthesia Post-op Note  Patient: Timothy Kline  Procedure(s) Performed: Procedure(s): IRRIGATION AND DEBRIDEMENT RIGHT  ARM (Right)  Patient Location: ICU  Anesthesia Type:General  Level of Consciousness: sedated and Patient remains intubated per anesthesia plan  Airway and Oxygen Therapy: Patient remains intubated per anesthesia plan and Patient placed on Ventilator (see vital sign flow sheet for setting)  Post-op Pain: unknown. pt. sedated.  Post-op Assessment: Post-op Vital signs reviewed, Patient's Cardiovascular Status Stable and Respiratory Function Stable  Post-op Vital Signs: Reviewed and stable  Complications: No apparent anesthesia complications

## 2013-08-12 NOTE — Progress Notes (Signed)
PCCM  CTSP for hypotension.  Norepi restarted.  Luisa Hart WrightMD

## 2013-08-12 NOTE — Preoperative (Signed)
Beta Blockers   Reason not to administer Beta Blockers:metoprolol ginen 11/8 at 1100
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
## 2013-08-12 NOTE — Anesthesia Postprocedure Evaluation (Signed)
  Anesthesia Post-op Note  Patient: Timothy Kline  Procedure(s) Performed: Procedure(s): IRRIGATION AND DEBRIDEMENT RIGHT  ARM (Right)  Patient Location: ICU  Anesthesia Type:General  Level of Consciousness: sedated and Patient remains intubated per anesthesia plan  Airway and Oxygen Therapy: Patient Spontanous Breathing, Patient remains intubated per anesthesia plan and Patient placed on Ventilator (see vital sign flow sheet for setting)  Post-op Pain: none  Post-op Assessment: Post-op Vital signs reviewed  Post-op Vital Signs: Reviewed  Complications: No apparent anesthesia complications

## 2013-08-12 NOTE — Progress Notes (Signed)
S: awake and alert O:BP 102/50  Pulse 96  Temp(Src) 100.8 F (38.2 C) (Core (Comment))  Resp 31  Wt 95.4 kg (210 lb 5.1 oz)  SpO2 100%  Intake/Output Summary (Last 24 hours) at 08/12/13 0806 Last data filed at 08/12/13 0700  Gross per 24 hour  Intake 2780.65 ml  Output   4800 ml  Net -2019.35 ml   Weight change: -1.6 kg (-3 lb 8.4 oz) Gen: intubated. Looks at me when I call his name [facility]: RRR Resp: clear ant Abd:+ BS soft ND Ext: tr edema  Ischemic looking digits on hands and feet. Weeping from sloughed skin on forearms  Rt fem HD catheter NEURO:arousable as above.  Moves all ext   . amiodarone  400 mg Oral BID   Followed by  . [START ON 08/16/2013] amiodarone  200 mg Oral Daily  . antiseptic oral rinse  15 mL Mouth Rinse QID  . chlorhexidine  15 mL Mouth Rinse BID  . feeding supplement (OXEPA)  1,000 mL Per Tube Q24H  . feeding supplement (PRO-STAT SUGAR FREE 64)  60 mL Per Tube TID  . furosemide  160 mg Intravenous TID  . meropenem (MERREM) IV  1 g Intravenous Q24H  . metoprolol tartrate  50 mg Per Tube BID  . pantoprazole (PROTONIX) IV  40 mg Intravenous QHS   No results found. BMET    Component Value Date/Time   NA 147* 08/12/2013 0419   K 3.4* 08/12/2013 0419   CL 106 08/12/2013 0419   CO2 21 08/12/2013 0419   GLUCOSE 137* 08/12/2013 0419   BUN 193* 08/12/2013 0419   CREATININE 4.55* 08/12/2013 0419   CALCIUM 7.0* 08/12/2013 0419   GFRNONAA 14* 08/12/2013 0419   GFRAA 16* 08/12/2013 0419   CBC    Component Value Date/Time   WBC 32.6* 08/12/2013 0419   RBC 2.43* 08/12/2013 0419   RBC 3.54* 08/06/2013 0419   HGB 7.4* 08/12/2013 0419   HCT 21.7* 08/12/2013 0419   PLT 76* 08/12/2013 0419   MCV 89.3 08/12/2013 0419   MCH 30.5 08/12/2013 0419   MCHC 34.1 08/12/2013 0419   RDW 17.7* 08/12/2013 0419   LYMPHSABS 1.5 08/11/2013 0455   MONOABS 0.6 08/11/2013 0455   EOSABS 0.3 08/11/2013 0455   BASOSABS 0.0 08/11/2013 0455     Assessment:  1. AKI, non-oliguric presumably  sec to sepsis. UO improving but Scr/BUN still rising 2. VDRF 3. + Hep C 4.  Thrombocytopenia presumably sec to sepsis, Plt rising  5. Afib/flutter on Amio, now NSR  Plan: 1. Plan HD today 2. Will hold diuretics for now as may be getting intravascularly dry as another reason for BUN to be so high 3. Recheck labs in AM   Danna Sewell T

## 2013-08-12 NOTE — Progress Notes (Signed)
Pt failed wean at this time. Placed back on full support. Complained of sob and had excessive agitation.

## 2013-08-12 NOTE — Anesthesia Preprocedure Evaluation (Addendum)
Anesthesia Evaluation  Patient identified by MRN, date of birth, ID band Patient unresponsive    Reviewed: Allergy & Precautions, H&P , NPO status , Patient's Chart, lab work & pertinent test results, reviewed documented beta blocker date and time , Unable to perform ROS - Chart review only  Airway       Dental   Pulmonary  breath sounds clear to auscultation        Cardiovascular Exercise Tolerance: Poor Rate:Normal     Neuro/Psych    GI/Hepatic (+) Hepatitis -, C  Endo/Other    Renal/GU ESRF, Dialysis and ARFRenal disease     Musculoskeletal   Abdominal   Peds  Hematology   Anesthesia Other Findings Low EF, sepsis  Reproductive/Obstetrics                          Anesthesia Physical Anesthesia Plan  ASA: IV  Anesthesia Plan: General   Post-op Pain Management:    Induction: Intravenous  Airway Management Planned: Oral ETT  Additional Equipment:   Intra-op Plan:   Post-operative Plan: Post-operative intubation/ventilation  Informed Consent:   History available from chart only  Plan Discussed with: CRNA, Anesthesiologist and Surgeon  Anesthesia Plan Comments: (No POA available, pt not able to respond)        Anesthesia Quick Evaluation
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
## 2013-08-12 NOTE — Progress Notes (Signed)
PULMONARY  / CRITICAL CARE MEDICINE  Name: Timothy Kline MRN: [id number] DOB: 1961-06-20    ADMISSION DATE:  08/04/2013 CONSULTATION DATE:  10/31  REFERRING MD :  [facility] PRIMARY SERVICE: PCCM  CHIEF COMPLAINT:  Respiratory Failure/ Sepsis/ Renal Failure  BRIEF PATIENT DESCRIPTION: [name] presented to [facility] 10/28 with AMS/Sepsis in setting of cocaine /oxycodone use. Pt admitted with Respiratory Failure (ARDS)/Sepsis has developed acute renal failure, thrombocytopenia, a-fib Flutter with RVR and was transferred to [facility] on 10/31 for HD and further care. CCM asked to admit.  SIGNIFICANT EVENTS / STUDIES:  10/28 Admission to [facility] 10/29 Metabolic Acidosis, pH=6.9 10/31 Transferred to [facility] for HD. 10/31 Cardioversion for Afib/Flutter rate 170's ( Unsuccessful).  Amiodarone initiated for Afib/Flutter RVR 10/31 Renal consult: CRRT initiated 10/31 Heme consult: TCP likely due to DIC. Doubt TTP/HUS 11/01 Echocardiogram: LVEF 20-25%. Diffuse HK 11/01 ID consult 11/01 Skin biopsy: thrombotic vasculopathic reaction  11/2 tolerated HD, off pressors.  Platelet transfusion for thrombocytopenia  11/3 CXR with new pulmonary opacities/nodules.  Concern for septic emboli.  11/03 CT chest: Multilobar bilateral areas of pulmonary parenchymal nodular consolidation with internal necrosis/ cavitation and suggestion of surrounding hazy opacity. The appearance is most typical for septic emboli 11/03 CT head: NAD 11/03 BCx from [place] 2/2 + for FUSOBACTERIUM NECROPHORUM.   11/04 CT neck (noncontrasted): No evidence of jugular vein thrombosis, abscesses, inflammation.   11/05 Jugular vein [place]: No thrombus noted 11/04 Converted to NSR from Afib/Aflutter  11/05 Dexmedetomidine initiated in anticipation of extubation soon.   11/06 C Diff negative, Tracheal aspirate few yeast  11/06 Continued fevers, restart vanc for possible MRSA PNA 11/07 Sinus Tract R antecubital arm  secondary to punch bx discovered  11/07 Wound Cx of R arm: Pending  11/08 MRI R forearm/elbow/humerus : Pending   LINES / TUBES: R IJ CVL 10/28 >>  ETT 10/29 >>  R femoral HD Cath 11/01 >>   CULTURES: [facility] >> + Fusobacterium Necrophorum BCx2 10/31>>> Negative Sputum 10/31>> Few yeast Tracheal Asp. 11/05>> Rare WBC, no organisms  C Diff 11/5 >> Negative  RUE wound 11/7 >>   ANTIBIOTICS: Rocephin: 10/28?>>>10/31 Levoquin:   10/28>>>10/31 Vancomycin 10/28>>> 11/3 Cefepime 10/31>>> 11/3 Flagyl 11/2 >> 11/3  Meropenem 11/3 >>  Vancomycin 11/6 >>   VITAL SIGNS: Temp:  [97.2 F (36.2 C)-101.9 F (38.8 C)] 101.2 F (38.4 C) (11/08 0645) Pulse Rate:  [66-101] 88 (11/08 0645) Resp:  [14-32] 26 (11/08 0645) BP: (75-156)/(41-83) 94/55 mmHg (11/08 0645) SpO2:  [93 %-100 %] 100 % (11/08 0645) FiO2 (%):  [40 %-100 %] 40 % (11/08 0400) Weight:  [210 lb 5.1 oz (95.4 kg)] 210 lb 5.1 oz (95.4 kg) (11/08 0430)  HEMODYNAMICS:    VENTILATOR SETTINGS: Vent Mode:  [-] PCV FiO2 (%):  [40 %-100 %] 40 % Set Rate:  [16 bmp] 16 bmp PEEP:  [5 cmH20] 5 cmH20 Pressure Support:  [5 cmH20-10 cmH20] 10 cmH20 Plateau Pressure:  [15 cmH20] 15 cmH20  INTAKE / OUTPUT: Intake/Output     11/07 0701 - 11/08 0700 11/08 0701 - 11/09 0700   I.V. (mL/kg) 1265 (13.3)    NG/GT 1345    IV Piggyback 298    Total Intake(mL/kg) 2908 (30.5)    Urine (mL/kg/hr) 4000 (1.7)    Other     Stool 1000 (0.4)    Total Output 5000     Net -2092.1  PHYSICAL EXAMINATION: General: On vent  HEENT: NCAT, ETT in place PULM: Occasional wheezing B/L, rhonchi RUL CV: RRR Ab: Soft, NABS Derm: Necrotic gangrenous extremities B/L UE/LE, blistering on arms. + 1 x 1 cm antecubital sinus tract with purulent draining material  Neuro: RASS 0 to +1, following commands, vent in place   LABS/IMGAING: I personally reviewed the lab work from the past 24 hours in comparison to previous and the relevant  findings can be found in the A/P.   08/12/13 MRI R forearm/elbow/humerus : Pending   ASSESSMENT / PLAN:  PULMONARY A:  Acute Respiratory Failure, VDRF Suspect COPD Septic pulmonary emboli P:   Vent settings reviewed, wean if not to OR for antecubital wound  Daily SBT as indicated Cont BDs Cont Precedex   CARDIOVASCULAR A:  Septic shock, resolved AFRVR - Converted 08/08/13 Cardiomyopathy (EF 20-25%) Digital Dry Gangrene  P:  Continue amiodarone 400 mg BID enteral, titrate per pharmacy Continue schedule enteral Metoprolol as HR/BP tolerate, PRN IV metoprolol for HR > 115/min Consider repeat Echo prior to discharge  RENAL A:   Acute Renal Failure > 11/1 HD start Metabolic Acidosis, resolved Oliguria, resolved P:  Renal following, HD as needed   Continue Lasix  Monitor BMET intermittently Correct electrolytes as indicated  GASTROINTESTINAL A:   Hypoalbuminemia Protein-calorie malnutrition P:   Cont TFs @ 45 kcal/hr   HEMATOLOGIC A:   Anemia  DIC  Thrombocytopenia - Improving  P:  Hgb drop from 9.8 to 7.4 over last two days, monitor CBC daily  Platelets are improving  Avoid plt transfusion unless actively bleeding  INFECTIOUS A:   Severe sepsis - Resolved  R Antecubital Sinus Tract secondary to punch Bx R Elbow Septic Jt?? 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 Hep C viral load/genotype pending  Abx per ID  Noncontrasted CT neck negative for thrombosis  [place] of B/L Jugular Veins negative for thrombosis   ENDOCRINE A: Hyperglycemia without prior dx of DM, stress related P:   CBG q 6 hrs, call > 200   NEUROLOGIC A:  Acute encephalopathy - Resolved  Chronic pain P:   Continue Precedex, Versed and fentanyl PRN, minimizing sedation   Continue intubation pending possible OR.   Daily WUA    PCCM ATTENDING: I have interviewed and examined the patient and reviewed the database. I have formulated the assessment and plan as reflected in the note above with amendments made by me. 40 mins of direct critical care time provided.  Discussed with Dr Rayann Heman, MD;  PCCM service; [place] [phone]   Seen with: Twana First. Paulina Fusi, DO of [facility] 08/12/2013, 7:40 AM

## 2013-08-12 NOTE — Progress Notes (Signed)
Patient ID: Timothy Kline, male   DOB: [birth year], 52 y.o.   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 amiodarone  400 mg Oral BID   Followed by  . [START ON 08/16/2013] amiodarone  200 mg Oral Daily  . antiseptic oral rinse  15 mL Mouth Rinse QID  . chlorhexidine  15 mL Mouth Rinse BID  . feeding supplement (OXEPA)  1,000 mL Per Tube Q24H  . feeding supplement (PRO-STAT SUGAR FREE 64)  60 mL Per Tube TID  . meropenem (MERREM) IV  1 g Intravenous Q24H  . metoprolol tartrate  50 mg Per Tube BID  . pantoprazole (PROTONIX) IV  40 mg Intravenous QHS  . vancomycin  1,000 mg Intravenous Once    Objective: Temp:  [97.2 F (36.2 C)-101.6 F (38.7 C)] 100.4 F (38 C) (11/08 1200) Pulse Rate:  [69-107] 78 (11/08 1400) Resp:  [13-32] 18 (11/08 1400) BP: (75-156)/(41-83) 86/54 mmHg (11/08 1400) SpO2:  [94 %-100 %] 100 % (11/08 1400) FiO2 (%):  [40 %-100 %] 40 % (11/08 1400) Weight:  [94.3 kg (207 lb 14.3 oz)-95.4 kg (210 lb 5.1 oz)] 94.3 kg (207 lb 14.3 oz) (11/08 1105)  General: Anxious according to nurses. Currently not responding to voice Skin: Prurulent wound in right antecubital fossa. Progressive dry ischemia of toes and fingers Lungs: Clear anteriorly Cor: Regular S1-S2 no murmurs Abdomen: Soft   Lab Results Lab Results  Component Value Date   WBC 32.6* 08/12/2013   HGB 7.4* 08/12/2013   HCT 21.7* 08/12/2013   MCV 89.3 08/12/2013   PLT 76* 08/12/2013    Lab Results  Component Value Date   CREATININE 4.55* 08/12/2013   BUN 193* 08/12/2013   NA 147* 08/12/2013   K 3.4* 08/12/2013   CL 106 08/12/2013   CO2 21 08/12/2013    Lab  Results  Component Value Date   ALT 13 08/11/2013   AST 34 08/11/2013   ALKPHOS 69 08/11/2013   BILITOT 1.7* 08/11/2013      Microbiology: Recent Results (from the past 240 hour(s))  MRSA PCR SCREENING     Status: None   Collection Time    08/04/13  3:21 PM      Result Value Range Status   MRSA by PCR NEGATIVE  NEGATIVE Final   Comment:            The GeneXpert MRSA Assay (FDA     approved for NASAL specimens     only), is one component of a     comprehensive MRSA colonization     surveillance program. It is not     intended to diagnose MRSA     infection nor to guide or     monitor treatment for     MRSA infections.  CULTURE, RESPIRATORY (NON-EXPECTORATED)     Status: None   Collection Time    08/04/13  8:27 PM      Result Value Range Status   Specimen Description TRACHEAL ASPIRATE   Final   Special Requests NONE   Final  Gram Stain     Final   Value: FEW WBC PRESENT,BOTH PMN AND MONONUCLEAR     NO SQUAMOUS EPITHELIAL CELLS SEEN     NO ORGANISMS SEEN     Performed at [facility]   Culture     Final   Value: FEW YEAST CONSISTENT WITH CANDIDA SPECIES     Performed at [facility]   Report Status 08/07/2013 FINAL   Final  CULTURE, BLOOD (ROUTINE X 2)     Status: None   Collection Time    08/04/13 11:08 PM      Result Value Range Status   Specimen Description BLOOD LEFT ARM   Final   Special Requests BOTTLES DRAWN AEROBIC ONLY 2.5CC   Final   Culture  Setup Time     Final   Value: 08/05/2013 04:03     Performed at [facility]   Culture     Final   Value: NO GROWTH 5 DAYS     Performed at [facility]   Report Status 08/11/2013 FINAL   Final  CULTURE, BLOOD (ROUTINE X 2)     Status: None   Collection Time    08/04/13 11:25 PM      Result Value Range Status   Specimen Description BLOOD RIGHT CENTRAL LINE   Final   Special Requests BOTTLES DRAWN AEROBIC AND ANAEROBIC 10CC EACH   Final   Culture  Setup Time     Final   Value:  08/05/2013 04:06     Performed at [facility]   Culture     Final   Value: NO GROWTH 5 DAYS     Performed at [facility]   Report Status 08/11/2013 FINAL   Final  CLOSTRIDIUM DIFFICILE BY PCR     Status: None   Collection Time    08/09/13  1:40 PM      Result Value Range Status   C difficile by pcr NEGATIVE  NEGATIVE Final  CULTURE, RESPIRATORY (NON-EXPECTORATED)     Status: None   Collection Time    08/09/13  4:00 PM      Result Value Range Status   Specimen Description ENDOTRACHEAL   Final   Special Requests Normal   Final   Gram Stain     Final   Value: RARE WBC PRESENT, PREDOMINANTLY PMN     NO SQUAMOUS EPITHELIAL CELLS SEEN     NO ORGANISMS SEEN     Performed at [facility]   Culture     Final   Value: FEW YEAST CONSISTENT WITH CANDIDA SPECIES     Performed at [facility]   Report Status 08/12/2013 FINAL   Final  WOUND CULTURE     Status: None   Collection Time    08/11/13 10:38 AM      Result Value Range Status   Specimen Description WOUND ARM RIGHT   Final   Special Requests NONE   Final   Gram Stain     Final   Value: NO WBC SEEN     NO SQUAMOUS EPITHELIAL CELLS SEEN     NO ORGANISMS SEEN     Performed at [facility]   Culture     Final   Value: NO GROWTH 1 DAY     Performed at [facility]   Report Status PENDING   Incomplete    Studies/Results: No results found.  Assessment: He has fusobacterium bacteremia, necrotizing pneumonia and  sepsis. I agree with exploration of his right elbow wound  Plan: 1. Continue vancomycin and meropenem 2. Await final cultures an operative results  Cliffton Asters, MD [facility] for Infectious Disease [facility] [phone] pager   [phone] cell 08/12/2013, 2:08 PM

## 2013-08-12 NOTE — Progress Notes (Signed)
Pt had hemodialysis Tx today to clean blood. No fluid was being pulled. Pt ended up getting some fluid during the Tx due to being hypotensive that did not correct with this action. ICU nurse aware and pt is to be started on some vasopressors since BP did not improve with the completion of dialysis Tx.

## 2013-08-12 NOTE — Transfer of Care (Signed)
Immediate Anesthesia Transfer of Care Note  Patient: Timothy Kline  Procedure(s) Performed: Procedure(s): IRRIGATION AND DEBRIDEMENT RIGHT  ARM (Right)  Patient Location: ICU  Anesthesia Type:General  Level of Consciousness: sedated and Patient remains intubated per anesthesia plan  Airway & Oxygen Therapy: Patient remains intubated per anesthesia plan and Patient placed on Ventilator (see vital sign flow sheet for setting)  Post-op Assessment: Report given to PACU RN and Post -op Vital signs reviewed and stable  Post vital signs: Reviewed and stable  Complications: No apparent anesthesia complications

## 2013-08-13 DIAGNOSIS — I70269 Atherosclerosis of native arteries of extremities with gangrene, unspecified extremity: Secondary | ICD-10-CM

## 2013-08-13 LAB — RENAL FUNCTION PANEL
Albumin: 1.6 g/dL — ABNORMAL LOW (ref 3.5–5.2)
BUN: 141 mg/dL — ABNORMAL HIGH (ref 6–23)
CO2: 24 mEq/L (ref 19–32)
Calcium: 7 mg/dL — ABNORMAL LOW (ref 8.4–10.5)
Chloride: 108 mEq/L (ref 96–112)
Creatinine, Ser: 3.56 mg/dL — ABNORMAL HIGH (ref 0.50–1.35)
GFR calc non Af Amer: 18 mL/min — ABNORMAL LOW (ref >90)
Glucose, Bld: 136 mg/dL — ABNORMAL HIGH (ref 70–99)
Phosphorus: 9.9 mg/dL — ABNORMAL HIGH (ref 2.3–4.6)

## 2013-08-13 LAB — WOUND CULTURE: Culture: NO GROWTH

## 2013-08-13 LAB — CBC
HCT: 18.6 % — ABNORMAL LOW (ref 39.0–52.0)
Hemoglobin: 6 g/dL — CL (ref 13.0–17.0)
RDW: 18.1 % — ABNORMAL HIGH (ref 11.5–15.5)

## 2013-08-13 LAB — PREPARE RBC (CROSSMATCH)

## 2013-08-13 LAB — GLUCOSE, CAPILLARY
Glucose-Capillary: 131 mg/dL — ABNORMAL HIGH (ref 70–99)
Glucose-Capillary: 101 mg/dL — ABNORMAL HIGH (ref 70–99)

## 2013-08-13 MED ORDER — WHITE PETROLATUM GEL
Status: AC
  Administered 2013-08-13: 1
  Filled 2013-08-13: qty 5

## 2013-08-13 NOTE — Progress Notes (Signed)
Patient ID: Timothy Kline, male   DOB: [birth year], 52 y.o.   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 amiodarone  400 mg Oral BID   Followed by  . [START ON 08/16/2013] amiodarone  200 mg Oral Daily  . antiseptic oral rinse  15 mL Mouth Rinse QID  . chlorhexidine  15 mL Mouth Rinse BID  . feeding supplement (OXEPA)  1,000 mL Per Tube Q24H  . feeding supplement (PRO-STAT SUGAR FREE 64)  60 mL Per Tube TID  . meropenem (MERREM) IV  1 g Intravenous Q24H  . metoprolol tartrate  50 mg Per Tube BID  . pantoprazole (PROTONIX) IV  40 mg Intravenous QHS   Objective: Temp:  [97.1 F (36.2 C)-101.8 F (38.8 C)] 97.1 F (36.2 C) (11/09 1045) Pulse Rate:  [64-102] 67 (11/09 1045) Resp:  [8-28] 12 (11/09 1045) BP: (74-141)/(43-89) 106/66 mmHg (11/09 1045) SpO2:  [87 %-100 %] 100 % (11/09 1045) FiO2 (%):  [40 %] 40 % (11/09 1000) Weight:  [93.5 kg (206 lb 2.1 oz)-94.9 kg (209 lb 3.5 oz)] 93.5 kg (206 lb 2.1 oz) (11/09 0500)  General: He remains intubated but is more alert Skin: Progressive demarcation of digital ischemia Lungs: Clear Cor: Regular S1-S2 no murmurs heard Abdomen: Soft and not obviously tender. Soft dark brown stool in rectal tube. Gauze dressing the over right arm wound  Lab Results Lab Results  Component Value Date   WBC 28.3* 08/13/2013   HGB 6.0* 08/13/2013   HCT 18.6* 08/13/2013   MCV 94.4 08/13/2013   PLT 136* 08/13/2013    Lab Results  Component Value Date   CREATININE 3.56* 08/13/2013   BUN 141* 08/13/2013   NA 146* 08/13/2013   K 3.7 08/13/2013   CL 108 08/13/2013   CO2 24 08/13/2013    Lab Results  Component  Value Date   ALT 13 08/11/2013   AST 34 08/11/2013   ALKPHOS 69 08/11/2013   BILITOT 1.7* 08/11/2013      Microbiology: Recent Results (from the past 240 hour(s))  MRSA PCR SCREENING     Status: None   Collection Time    08/04/13  3:21 PM      Result Value Range Status   MRSA by PCR NEGATIVE  NEGATIVE Final   Comment:            The GeneXpert MRSA Assay (FDA     approved for NASAL specimens     only), is one component of a     comprehensive MRSA colonization     surveillance program. It is not     intended to diagnose MRSA     infection nor to guide or     monitor treatment for     MRSA infections.  CULTURE, RESPIRATORY (NON-EXPECTORATED)     Status: None   Collection Time    08/04/13  8:27 PM      Result Value Range Status   Specimen Description TRACHEAL ASPIRATE   Final   Special Requests NONE   Final   Gram Stain  Final   Value: FEW WBC PRESENT,BOTH PMN AND MONONUCLEAR     NO SQUAMOUS EPITHELIAL CELLS SEEN     NO ORGANISMS SEEN     Performed at [facility]   Culture     Final   Value: FEW YEAST CONSISTENT WITH CANDIDA SPECIES     Performed at [facility]   Report Status 08/07/2013 FINAL   Final  CULTURE, BLOOD (ROUTINE X 2)     Status: None   Collection Time    08/04/13 11:08 PM      Result Value Range Status   Specimen Description BLOOD LEFT ARM   Final   Special Requests BOTTLES DRAWN AEROBIC ONLY 2.5CC   Final   Culture  Setup Time     Final   Value: 08/05/2013 04:03     Performed at [facility]   Culture     Final   Value: NO GROWTH 5 DAYS     Performed at [facility]   Report Status 08/11/2013 FINAL   Final  CULTURE, BLOOD (ROUTINE X 2)     Status: None   Collection Time    08/04/13 11:25 PM      Result Value Range Status   Specimen Description BLOOD RIGHT CENTRAL LINE   Final   Special Requests BOTTLES DRAWN AEROBIC AND ANAEROBIC 10CC EACH   Final   Culture  Setup Time     Final   Value: 08/05/2013 04:06      Performed at [facility]   Culture     Final   Value: NO GROWTH 5 DAYS     Performed at [facility]   Report Status 08/11/2013 FINAL   Final  CLOSTRIDIUM DIFFICILE BY PCR     Status: None   Collection Time    08/09/13  1:40 PM      Result Value Range Status   C difficile by pcr NEGATIVE  NEGATIVE Final  CULTURE, RESPIRATORY (NON-EXPECTORATED)     Status: None   Collection Time    08/09/13  4:00 PM      Result Value Range Status   Specimen Description ENDOTRACHEAL   Final   Special Requests Normal   Final   Gram Stain     Final   Value: RARE WBC PRESENT, PREDOMINANTLY PMN     NO SQUAMOUS EPITHELIAL CELLS SEEN     NO ORGANISMS SEEN     Performed at [facility]   Culture     Final   Value: FEW YEAST CONSISTENT WITH CANDIDA SPECIES     Performed at [facility]   Report Status 08/12/2013 FINAL   Final  WOUND CULTURE     Status: None   Collection Time    08/11/13 10:38 AM      Result Value Range Status   Specimen Description WOUND ARM RIGHT   Final   Special Requests NONE   Final   Gram Stain     Final   Value: NO WBC SEEN     NO SQUAMOUS EPITHELIAL CELLS SEEN     NO ORGANISMS SEEN     Performed at [facility]   Culture     Final   Value: NO GROWTH 2 DAYS     Performed at [facility]   Report Status 08/13/2013 FINAL   Final    Studies/Results: No results found.  Assessment: He remains febrile on therapy for his right arm wound infection and  Fusobacterium bacteremia. I suspect the fever is due to the extensive digital tissue necrosis.  Plan: 1. Continue vancomycin and meropenem  Cliffton Asters, MD [facility] [phone] pager   [phone] cell 08/13/2013, 12:18 PM

## 2013-08-13 NOTE — Progress Notes (Signed)
CRITICAL VALUE ALERT  Critical value received:  Hgb 6.0  Date of notification:  08/13/2013  Time of notification:  0450  Critical value read back:yes  Nurse who received alert:  Elisha Headland, RN  MD notified (1st page):  Katrinka Blazing  Time of first page:  0505  Responding MD:  Katrinka Blazing  Time MD responded:  [phone]

## 2013-08-13 NOTE — Progress Notes (Signed)
eLink Physician-Brief Progress Note Patient Name: Timothy Kline DOB: 1961-05-26 MRN: [id number]  Date of Service  08/13/2013   HPI/Events of Note   hgb 6 this am  eICU Interventions  Transfuse 2 units prbc's ordered.        Deanna Artis 08/13/2013, 5:07 AM

## 2013-08-13 NOTE — Op Note (Signed)
[name], [name] NO.:  [id number]  MEDICAL RECORD NO.:  [id number]  LOCATION:  2M09C                        FACILITY:  [facility]  PHYSICIAN:  Johnette Abraham, MD    DATE OF BIRTH:  [birth year]  DATE OF PROCEDURE:  08/12/2013 DATE OF DISCHARGE:                              OPERATIVE REPORT   PREOPERATIVE DIAGNOSIS:  Draining wound on the right antecubital fossa.  POSTOPERATIVE DIAGNOSIS:  Draining wound on the right antecubital fossa.  PROCEDURE:  Exploration of wound, right antecubital fossa, full- thickness skin and subcutaneous tissue, debridement, thorough irrigation, and packing the wound.  ANESTHESIA:  General.  ESTIMATED BLOOD LOSS:  Minimal.  COMPLICATIONS:  No acute complications.  SPECIMENS:  No specimens.  INDICATIONS:  Timothy Kline is a gentleman who was transferred here on multisystem organ failure, sepsis, several days ago, he has been resuscitated and the ICU staff has noticed a draining wound on the right antecubital fossa.  I was consulted for possible exploration.  Consent was obtained for exploration of the wound.  I personally spoke with family and described the procedure and risks, etc.  DESCRIPTION OF PROCEDURE:  The patient was taken to the operating room. The patient was intubated previously.  Anesthesia was administered.  The right upper extremity was prepped and draped in normal sterile fashion. The wound measured approximately 1.5 cm in diameter.  There was some necrotic skin, distally, this was debrided, this was full-thickness skin.  There was a large vein centrally and the wound that was thrombosed, this was transected both proximally and distally and tied with a central exposed portion removed.  Exploration of the wound was then performed and appeared that there was a tracking of the wound both proximally and laterally as well as distally.  On the anterior radial forearm, there was some fat necrosis and some fibrinous  exudate overlying the muscle fascia of the muscle belly, however, was a little pale but viable.  After thorough probing of the subcutaneous tissues and fascial  area with the Yankauer suction, the wound was thoroughly irrigated with a L of bacitracin antibiotic solution.  Afterwards, the pulse lavage was used to further irrigate the cavity up underneath the skin.  Compression of the forearm and the proximal arm did not reveal any additional purulent drainage after the irrigation had been performed.  There did not appear to be any deep track down medially or to the joint.  This seemed to involve only the subcutaneous tissues and muscle fascia.  Afterwards saline moistened, Kerlix was packed gently up underneath the skin on top of the muscle to allow the wound to drain, this was covered with sterile dressing and Ace wrap. The patient tolerated procedure well.  There was no additional hemodynamic instability.  The patient was returned to his intensive care bed and followed closely.     Johnette Abraham, MD     HCC/MEDQ  D:  08/12/2013  T:  08/13/2013  Job:  [id number]
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
## 2013-08-13 NOTE — Progress Notes (Signed)
S: pt sedated, ventilated, awakens to verbal.  O:Blood pressure 121/72, pulse 64, temperature 97.3 F (36.3 C), temperature source Core (Comment), resp. rate 8, weight 93.5 kg (206 lb 2.1 oz), SpO2 94.00%.  R antecubital fossa:  Packing removed, nice pink muscle visible, still some fibrinous exudate, no pus WBC's noted - decreased slightly  A:s/p I&D Right antecubital fossa   P: gently pack wound with moist saline kerlex bid, tid, and cover, may benefit from vac dressing as once extubated wound care (packing) may be uncomfortable.

## 2013-08-13 NOTE — Progress Notes (Signed)
S: awake and alert  SP I&D of rt arm O:BP 107/65  Pulse 74  Temp(Src) 99.7 F (37.6 C) (Core (Comment))  Resp 17  Wt 93.5 kg (206 lb 2.1 oz)  SpO2 100%  Intake/Output Summary (Last 24 hours) at 08/13/13 0812 Last data filed at 08/13/13 0755  Gross per 24 hour  Intake 2987.91 ml  Output   1522 ml  Net 1465.91 ml   Weight change: -1.1 kg (-2 lb 6.8 oz) Gen: intubated. CVS: RRR Resp: clear ant Abd:+ BS soft NDNT Ext: tr-1+ edema  Ischemic looking digits on hands and feet. Weeping from sloughed skin on forearms  Rt fem HD catheter NEURO: Nods appropriately to ?  Moves all ext   . amiodarone  400 mg Oral BID   Followed by  . [START ON 08/16/2013] amiodarone  200 mg Oral Daily  . antiseptic oral rinse  15 mL Mouth Rinse QID  . chlorhexidine  15 mL Mouth Rinse BID  . feeding supplement (OXEPA)  1,000 mL Per Tube Q24H  . feeding supplement (PRO-STAT SUGAR FREE 64)  60 mL Per Tube TID  . meropenem (MERREM) IV  1 g Intravenous Q24H  . metoprolol tartrate  50 mg Per Tube BID  . pantoprazole (PROTONIX) IV  40 mg Intravenous QHS   No results found. BMET    Component Value Date/Time   NA 146* 08/13/2013 0400   K 3.7 08/13/2013 0400   CL 108 08/13/2013 0400   CO2 24 08/13/2013 0400   GLUCOSE 136* 08/13/2013 0400   BUN 141* 08/13/2013 0400   CREATININE 3.56* 08/13/2013 0400   CALCIUM 7.0* 08/13/2013 0400   GFRNONAA 18* 08/13/2013 0400   GFRAA 21* 08/13/2013 0400   CBC    Component Value Date/Time   WBC 28.3* 08/13/2013 0400   RBC 1.97* 08/13/2013 0400   RBC 3.54* 08/06/2013 0419   HGB 6.0* 08/13/2013 0400   HCT 18.6* 08/13/2013 0400   PLT 136* 08/13/2013 0400   MCV 94.4 08/13/2013 0400   MCH 30.5 08/13/2013 0400   MCHC 32.3 08/13/2013 0400   RDW 18.1* 08/13/2013 0400   LYMPHSABS 1.5 08/11/2013 0455   MONOABS 0.6 08/11/2013 0455   EOSABS 0.3 08/11/2013 0455   BASOSABS 0.0 08/11/2013 0455     Assessment:  1. AKI, non-oliguric presumably sec to sepsis. UO improving  2. VDRF 3. + Hep  C 4.  Thrombocytopenia presumably sec to sepsis, Plt rising  5. Afib/flutter on Amio, now NSR 6. Anemia, receiving 2 units PRBC's  Plan: 1. Hopefully renal fx is on the verge of recovery.  Will assess in A to deterine any ore HD 2 Recheck labs in AM 3. Agree with PRBC   Dynasti Kerman T

## 2013-08-14 ENCOUNTER — Inpatient Hospital Stay (HOSPITAL_COMMUNITY): Payer: BC Managed Care – PPO

## 2013-08-14 ENCOUNTER — Encounter (HOSPITAL_COMMUNITY): Payer: Self-pay | Admitting: General Surgery

## 2013-08-14 DIAGNOSIS — M009 Pyogenic arthritis, unspecified: Secondary | ICD-10-CM

## 2013-08-14 DIAGNOSIS — J158 Pneumonia due to other specified bacteria: Secondary | ICD-10-CM

## 2013-08-14 DIAGNOSIS — I96 Gangrene, not elsewhere classified: Secondary | ICD-10-CM

## 2013-08-14 LAB — RENAL FUNCTION PANEL
Albumin: 1.3 g/dL — ABNORMAL LOW (ref 3.5–5.2)
BUN: 161 mg/dL — ABNORMAL HIGH (ref 6–23)
Creatinine, Ser: 3.54 mg/dL — ABNORMAL HIGH (ref 0.50–1.35)
GFR calc Af Amer: 21 mL/min — ABNORMAL LOW (ref >90)
Phosphorus: 10.8 mg/dL — ABNORMAL HIGH (ref 2.3–4.6)
Potassium: 3.7 mEq/L (ref 3.5–5.1)
Sodium: 149 mEq/L — ABNORMAL HIGH (ref 135–145)

## 2013-08-14 LAB — GLUCOSE, CAPILLARY
Glucose-Capillary: 96 mg/dL (ref 70–99)
Glucose-Capillary: 135 mg/dL — ABNORMAL HIGH (ref 70–99)

## 2013-08-14 LAB — CBC
HCT: 20.8 % — ABNORMAL LOW (ref 39.0–52.0)
MCH: 30.3 pg (ref 26.0–34.0)
MCHC: 34.1 g/dL (ref 30.0–36.0)
MCV: 88.9 fL (ref 78.0–100.0)
RDW: 18.8 % — ABNORMAL HIGH (ref 11.5–15.5)

## 2013-08-14 MED ORDER — BIOTENE DRY MOUTH MT LIQD
15.0000 mL | Freq: Two times a day (BID) | OROMUCOSAL | Status: DC
  Administered 2013-08-14 – 2013-08-26 (×25): 15 mL via OROMUCOSAL

## 2013-08-14 MED ORDER — FREE WATER
200.0000 mL | Freq: Three times a day (TID) | Status: DC
  Administered 2013-08-15: 200 mL

## 2013-08-14 MED ORDER — FENTANYL CITRATE 0.05 MG/ML IJ SOLN
25.0000 ug | INTRAMUSCULAR | Status: DC | PRN
  Administered 2013-08-14 – 2013-08-15 (×2): 50 ug via INTRAVENOUS
  Administered 2013-08-17: 25 ug via INTRAVENOUS
  Administered 2013-08-30: 50 ug via INTRAVENOUS
  Filled 2013-08-14 (×4): qty 2

## 2013-08-14 MED ORDER — DEXTROSE 5 % IV SOLN
INTRAVENOUS | Status: DC
  Administered 2013-08-14: 12:00:00 via INTRAVENOUS

## 2013-08-14 NOTE — Progress Notes (Signed)
[facility] ROUNDING NOTE   Subjective:   Interval History: awake and alert  Objective:  Vital signs in last 24 hours:  Temp:  [96.8 F (36 C)-101.4 F (38.6 C)] 97.6 F (36.4 C) (11/10 0900) Pulse Rate:  [62-144] 68 (11/10 0900) Resp:  [10-32] 15 (11/10 0900) BP: (81-128)/(50-73) 87/50 mmHg (11/10 0900) SpO2:  [90 %-100 %] 97 % (11/10 0940) FiO2 (%):  [40 %] 40 % (11/10 0857)  Weight change:  Filed Weights   08/12/13 1105 08/12/13 1454 08/13/13 0500  Weight: 94.3 kg (207 lb 14.3 oz) 94.9 kg (209 lb 3.5 oz) 93.5 kg (206 lb 2.1 oz)    Intake/Output: I/O last 3 completed shifts: In: 4916.5 [I.V.:2151.5; Blood:650; NG/GT:2015; IV Piggyback:100] Out: 4305 [Urine:3480; Stool:825]   Intake/Output this shift:  Total I/O In: 117.4 [I.V.:72.4; NG/GT:45] Out: -   General: He  is intubated  Lungs: CTA CVS RRR Abdomen: Soft and not obviously tender. Soft dark brown stool in rectal tube.  Gauze dressing the over right arm wound Skin: Progressive demarcation of digital ischemia  Basic Metabolic Panel:  Recent Labs Lab 08/09/13 0425 08/10/13 0425 08/11/13 0455 08/12/13 0419 08/13/13 0400 08/14/13 0500  NA 143 145 143 147* 146* 149*  K 4.0 4.1 3.7 3.4* 3.7 3.7  CL 101 104 105 106 108 112  CO2 22 23 22 21 24 22   GLUCOSE 184* 137* 156* 137* 136* 130*  BUN 179* 180* 164* 193* 141* 161*  CREATININE 4.95* 4.53* 4.16* 4.55* 3.56* 3.54*  CALCIUM 7.2* 7.6* 7.1* 7.0* 7.0* 7.0*  PHOS 8.8* 8.3*  --  10.4* 9.9* 10.8*    Liver Function Tests:  Recent Labs Lab 08/08/13 0500  08/10/13 0425 08/11/13 0455 08/12/13 0419 08/13/13 0400 08/14/13 0500  AST 23  --   --  34  --   --   --   ALT 7  --   --  13  --   --   --   ALKPHOS 91  --   --  69  --   --   --   BILITOT 2.2*  --   --  1.7*  --   --   --   PROT 5.5*  --   --  4.6*  --   --   --   ALBUMIN 1.5*  < > 1.6* 1.4* 1.6* 1.6* 1.3*  < > = values in this interval not displayed. No results found for this  basename: LIPASE, AMYLASE,  in the last 168 hours No results found for this basename: AMMONIA,  in the last 168 hours  CBC:  Recent Labs Lab 08/10/13 0500 08/11/13 0455 08/12/13 0419 08/13/13 0400 08/14/13 0500  WBC 35.5* 29.9* 32.6* 28.3* 21.8*  NEUTROABS  --  27.5*  --   --   --   HGB 9.8* 7.8* 7.4* 6.0* 7.1*  HCT 27.3* 23.0* 21.7* 18.6* 20.8*  MCV 84.5 89.1 89.3 94.4 88.9  PLT 51* 46* 76* 136* 170    Cardiac Enzymes: No results found for this basename: CKTOTAL, CKMB, CKMBINDEX, TROPONINI,  in the last 168 hours  BNP: No components found with this basename: POCBNP,   CBG:  Recent Labs Lab 08/12/13 1735 08/12/13 2358 08/13/13 1111 08/13/13 1802 08/13/13 2354  GLUCAP 108* 126* 131* 101* 131*    Microbiology: Results for orders placed during the hospital encounter of 08/04/13  MRSA PCR SCREENING     Status: None   Collection Time    08/04/13  3:21 PM  Result Value Range Status   MRSA by PCR NEGATIVE  NEGATIVE Final   Comment:            The GeneXpert MRSA Assay (FDA     approved for NASAL specimens     only), is one component of a     comprehensive MRSA colonization     surveillance program. It is not     intended to diagnose MRSA     infection nor to guide or     monitor treatment for     MRSA infections.  CULTURE, RESPIRATORY (NON-EXPECTORATED)     Status: None   Collection Time    08/04/13  8:27 PM      Result Value Range Status   Specimen Description TRACHEAL ASPIRATE   Final   Special Requests NONE   Final   Gram Stain     Final   Value: FEW WBC PRESENT,BOTH PMN AND MONONUCLEAR     NO SQUAMOUS EPITHELIAL CELLS SEEN     NO ORGANISMS SEEN     Performed at [facility]   Culture     Final   Value: FEW YEAST CONSISTENT WITH CANDIDA SPECIES     Performed at [facility]   Report Status 08/07/2013 FINAL   Final  CULTURE, BLOOD (ROUTINE X 2)     Status: None   Collection Time    08/04/13 11:08 PM      Result Value Range Status    Specimen Description BLOOD LEFT ARM   Final   Special Requests BOTTLES DRAWN AEROBIC ONLY 2.5CC   Final   Culture  Setup Time     Final   Value: 08/05/2013 04:03     Performed at [facility]   Culture     Final   Value: NO GROWTH 5 DAYS     Performed at [facility]   Report Status 08/11/2013 FINAL   Final  CULTURE, BLOOD (ROUTINE X 2)     Status: None   Collection Time    08/04/13 11:25 PM      Result Value Range Status   Specimen Description BLOOD RIGHT CENTRAL LINE   Final   Special Requests BOTTLES DRAWN AEROBIC AND ANAEROBIC 10CC EACH   Final   Culture  Setup Time     Final   Value: 08/05/2013 04:06     Performed at [facility]   Culture     Final   Value: NO GROWTH 5 DAYS     Performed at [facility]   Report Status 08/11/2013 FINAL   Final  CLOSTRIDIUM DIFFICILE BY PCR     Status: None   Collection Time    08/09/13  1:40 PM      Result Value Range Status   C difficile by pcr NEGATIVE  NEGATIVE Final  CULTURE, RESPIRATORY (NON-EXPECTORATED)     Status: None   Collection Time    08/09/13  4:00 PM      Result Value Range Status   Specimen Description ENDOTRACHEAL   Final   Special Requests Normal   Final   Gram Stain     Final   Value: RARE WBC PRESENT, PREDOMINANTLY PMN     NO SQUAMOUS EPITHELIAL CELLS SEEN     NO ORGANISMS SEEN     Performed at [facility]   Culture     Final   Value: FEW YEAST CONSISTENT WITH CANDIDA SPECIES     Performed at [facility]   Report Status 08/12/2013 FINAL   Final  WOUND CULTURE     Status: None   Collection Time    08/11/13 10:38 AM      Result Value Range Status   Specimen Description WOUND ARM RIGHT   Final   Special Requests NONE   Final   Gram Stain     Final   Value: NO WBC SEEN     NO SQUAMOUS EPITHELIAL CELLS SEEN     NO ORGANISMS SEEN     Performed at [facility]   Culture     Final   Value: NO GROWTH 2 DAYS     Performed at [facility]    Report Status 08/13/2013 FINAL   Final    Coagulation Studies: No results found for this basename: LABPROT, INR,  in the last 72 hours  Urinalysis: No results found for this basename: COLORURINE, APPERANCEUR, LABSPEC, PHURINE, GLUCOSEU, HGBUR, BILIRUBINUR, KETONESUR, PROTEINUR, UROBILINOGEN, NITRITE, LEUKOCYTESUR,  in the last 72 hours    Imaging: Dg Chest Port 1 View  08/14/2013   CLINICAL DATA:  Respiratory failure.  EXAM: PORTABLE CHEST - 1 VIEW  COMPARISON:  Chest radiograph 08/10/2013.  FINDINGS: ET tube terminates 2.2 cm superior to the carina. NG tube tip and side-port course inferior to the diaphragm, tip not included on this examination. Right IJ central venous catheter tip projects over the superior vena cava. Stable cardiac and mediastinal contours. No significant interval change in multiple bilateral nodular pulmonary opacities. No definite pleural effusion or pneumothorax.  IMPRESSION: ET tube terminates 2.2 cm superior to the carina  Chest radiograph with unchanged bilateral nodular pulmonary opacities, potentially representing multi focal septic emboli.   Electronically Signed   By: Annia Belt M.D.   On: 08/14/2013 07:34     Medications:   . dexmedetomidine Stopped (08/14/13 0900)  . fentaNYL infusion INTRAVENOUS Stopped (08/14/13 0900)   . antiseptic oral rinse  15 mL Mouth Rinse QID  . chlorhexidine  15 mL Mouth Rinse BID  . feeding supplement (OXEPA)  1,000 mL Per Tube Q24H  . feeding supplement (PRO-STAT SUGAR FREE 64)  60 mL Per Tube TID  . free water  200 mL Per Tube Q8H  . meropenem (MERREM) IV  1 g Intravenous Q24H  . pantoprazole (PROTONIX) IV  40 mg Intravenous QHS   sodium chloride, acetaminophen (TYLENOL) oral liquid 160 mg/5 mL, albuterol, anticoagulant sodium citrate, fentaNYL, heparin, influenza vac split quadrivalent PF, lidocaine (PF), lidocaine-prilocaine, metoprolol, midazolam, pentafluoroprop-tetrafluoroeth, pneumococcal 23 valent  vaccine  Assessment/ Plan:  [name] presented to [facility] 10/28 with AMS/Sepsis in setting of cocaine /oxycodone use. Pt admitted with Respiratory Failure (ARDS)/Sepsis has developed acute renal failure, thrombocytopenia, a-fib Flutter with RVR and was transferred to [facility] on 10/31.  CRRT started 10/31 transitioned to intermittent dialysis Last dialysis 11/9.   1. Acute renal failure with extensive uremia will need dialysis most likely in AM  2. Hypernatremia would increase free water. Start IV water at 75cc/hr  3. Anemia  GI blood loss transfuse as necessary  4. Atrial fibrillation  Amiodarone   5. PURULENT soft tissue infection +/- deep infection of the arm  Treated with meropenum and Vancomycin     LOS: 10 Timothy Kline @TODAY @9 :58 AM

## 2013-08-14 NOTE — Progress Notes (Signed)
Pt on Fentanyl drip 250 ml of 98mcg/ml;  230 ml wasted in sink with Ardith Dark, RN  Burnard Bunting, RN

## 2013-08-14 NOTE — Progress Notes (Signed)
[facility]   Day # 8  Meropenem Day # 5 vancomycin  Subjective: Extubated is thirsty   Antibiotics:  Anti-infectives   Start     Dose/Rate Route Frequency Ordered Stop   08/12/13 1637  polymyxin B 500,000 Units, bacitracin 50,000 Units in sodium chloride irrigation 0.9 % 500 mL irrigation  Status:  Discontinued       As needed 08/12/13 1638 08/12/13 1703   08/12/13 1300  vancomycin (VANCOCIN) IVPB 1000 mg/200 mL premix     1,000 mg 200 mL/hr over 60 Minutes Intravenous  Once 08/12/13 1147 08/12/13 1441   08/11/13 1830  meropenem (MERREM) 1 g in sodium chloride 0.9 % 100 mL IVPB     1 g 200 mL/hr over 30 Minutes Intravenous Every 24 hours 08/11/13 1145     08/11/13 1100  vancomycin (VANCOCIN) IVPB 750 mg/150 ml premix     750 mg 150 mL/hr over 60 Minutes Intravenous  Once 08/11/13 1026 08/11/13 1321   08/10/13 1745  vancomycin (VANCOCIN) IVPB 750 mg/150 ml premix     750 mg 150 mL/hr over 60 Minutes Intravenous  Once 08/10/13 1734 08/10/13 1920   08/07/13 1830  meropenem (MERREM) 500 mg in sodium chloride 0.9 % 50 mL IVPB  Status:  Discontinued     500 mg 100 mL/hr over 30 Minutes Intravenous Every 24 hours 08/07/13 1745 08/11/13 1145   08/06/13 2000  vancomycin (VANCOCIN) IVPB 750 mg/150 ml premix     750 mg 150 mL/hr over 60 Minutes Intravenous  Once 08/06/13 1056 08/07/13 0330   08/05/13 2200  ceFEPIme (MAXIPIME) 1 g in dextrose 5 % 50 mL IVPB  Status:  Discontinued     1 g 100 mL/hr over 30 Minutes Intravenous Every 24 hours 08/04/13 1815 08/07/13 1656   08/05/13 1200  metroNIDAZOLE (FLAGYL) IVPB 500 mg  Status:  Discontinued     500 mg 100 mL/hr over 60 Minutes Intravenous Every 8 hours 08/05/13 1049 08/07/13 1656   08/04/13 1630  ceFEPIme (MAXIPIME) 2 g in dextrose 5 % 50 mL IVPB     2 g 100 mL/hr over 30 Minutes Intravenous  Once 08/04/13 1604 08/04/13 1747      Medications: Scheduled Meds: . antiseptic oral rinse  15 mL Mouth  Rinse QID  . chlorhexidine  15 mL Mouth Rinse BID  . feeding supplement (OXEPA)  1,000 mL Per Tube Q24H  . feeding supplement (PRO-STAT SUGAR FREE 64)  60 mL Per Tube TID  . free water  200 mL Per Tube Q8H  . meropenem (MERREM) IV  1 g Intravenous Q24H  . pantoprazole (PROTONIX) IV  40 mg Intravenous QHS   Continuous Infusions: . dexmedetomidine Stopped (08/14/13 0900)  . dextrose 75 mL/hr at 08/14/13 1227  . fentaNYL infusion INTRAVENOUS Stopped (08/14/13 0900)   PRN Meds:.sodium chloride, acetaminophen (TYLENOL) oral liquid 160 mg/5 mL, albuterol, anticoagulant sodium citrate, fentaNYL, heparin, influenza vac split quadrivalent PF, lidocaine (PF), lidocaine-prilocaine, metoprolol, midazolam, pentafluoroprop-tetrafluoroeth, pneumococcal 23 valent vaccine   Objective: Weight change:   Intake/Output Summary (Last 24 hours) at 08/14/13 1411 Last data filed at 08/14/13 1200  Gross per 24 hour  Intake 2018.04 ml  Output   2935 ml  Net -916.96 ml   Blood pressure 97/61, pulse 78, temperature 98.3 F (36.8 C), temperature source Core (Comment), resp. rate 22, weight 206 lb 2.1 oz (93.5 kg), SpO2 100.00%. Temp:  [96.8 F (36 C)-101.4 F (38.6 C)] 98.3 F (  36.8 C) (11/10 1000) Pulse Rate:  [62-144] 78 (11/10 1000) Resp:  [12-32] 22 (11/10 1000) BP: (81-128)/(50-73) 97/61 mmHg (11/10 1000) SpO2:  [97 %-100 %] 100 % (11/10 1000) FiO2 (%):  [40 %] 40 % (11/10 0857)  Physical Exam: General: poor dentition HEENT: anicteric sclera,, EOMI, right neck with central line CVS tachycardic rate, irr irr  no murmur rubs or gallops Chest:  rhonchi Abdomen: softnondistended, ? Tenderness to palpation of abdomennormal bowel sounds, Rectal pouch with copious brown stool Extremities: Purpuric rash prominent especially on lower extremities w necrotic changes  RUE: dressed in bandage  Skin: cyanotic dry gangrene of fingers, toes bilaterally  Neuro: nonfocal,  Lab Results:  Recent Labs   08/13/13 0400 08/14/13 0500  WBC 28.3* 21.8*  HGB 6.0* 7.1*  HCT 18.6* 20.8*  PLT 136* 170    BMET  Recent Labs  08/13/13 0400 08/14/13 0500  NA 146* 149*  K 3.7 3.7  CL 108 112  CO2 24 22  GLUCOSE 136* 130*  BUN 141* 161*  CREATININE 3.56* 3.54*  CALCIUM 7.0* 7.0*    Micro Results: Recent Results (from the past 240 hour(s))  MRSA PCR SCREENING     Status: None   Collection Time    08/04/13  3:21 PM      Result Value Range Status   MRSA by PCR NEGATIVE  NEGATIVE Final   Comment:            The GeneXpert MRSA Assay (FDA     approved for NASAL specimens     only), is one component of a     comprehensive MRSA colonization     surveillance program. It is not     intended to diagnose MRSA     infection nor to guide or     monitor treatment for     MRSA infections.  CULTURE, RESPIRATORY (NON-EXPECTORATED)     Status: None   Collection Time    08/04/13  8:27 PM      Result Value Range Status   Specimen Description TRACHEAL ASPIRATE   Final   Special Requests NONE   Final   Gram Stain     Final   Value: FEW WBC PRESENT,BOTH PMN AND MONONUCLEAR     NO SQUAMOUS EPITHELIAL CELLS SEEN     NO ORGANISMS SEEN     Performed at [facility]   Culture     Final   Value: FEW YEAST CONSISTENT WITH CANDIDA SPECIES     Performed at [facility]   Report Status 08/07/2013 FINAL   Final  CULTURE, BLOOD (ROUTINE X 2)     Status: None   Collection Time    08/04/13 11:08 PM      Result Value Range Status   Specimen Description BLOOD LEFT ARM   Final   Special Requests BOTTLES DRAWN AEROBIC ONLY 2.5CC   Final   Culture  Setup Time     Final   Value: 08/05/2013 04:03     Performed at [facility]   Culture     Final   Value: NO GROWTH 5 DAYS     Performed at [facility]   Report Status 08/11/2013 FINAL   Final  CULTURE, BLOOD (ROUTINE X 2)     Status: None   Collection Time    08/04/13 11:25 PM      Result Value Range Status   Specimen  Description BLOOD RIGHT CENTRAL LINE   Final  Special Requests BOTTLES DRAWN AEROBIC AND ANAEROBIC 10CC EACH   Final   Culture  Setup Time     Final   Value: 08/05/2013 04:06     Performed at [facility]   Culture     Final   Value: NO GROWTH 5 DAYS     Performed at [facility]   Report Status 08/11/2013 FINAL   Final  CLOSTRIDIUM DIFFICILE BY PCR     Status: None   Collection Time    08/09/13  1:40 PM      Result Value Range Status   C difficile by pcr NEGATIVE  NEGATIVE Final  CULTURE, RESPIRATORY (NON-EXPECTORATED)     Status: None   Collection Time    08/09/13  4:00 PM      Result Value Range Status   Specimen Description ENDOTRACHEAL   Final   Special Requests Normal   Final   Gram Stain     Final   Value: RARE WBC PRESENT, PREDOMINANTLY PMN     NO SQUAMOUS EPITHELIAL CELLS SEEN     NO ORGANISMS SEEN     Performed at [facility]   Culture     Final   Value: FEW YEAST CONSISTENT WITH CANDIDA SPECIES     Performed at [facility]   Report Status 08/12/2013 FINAL   Final  WOUND CULTURE     Status: None   Collection Time    08/11/13 10:38 AM      Result Value Range Status   Specimen Description WOUND ARM RIGHT   Final   Special Requests NONE   Final   Gram Stain     Final   Value: NO WBC SEEN     NO SQUAMOUS EPITHELIAL CELLS SEEN     NO ORGANISMS SEEN     Performed at [facility]   Culture     Final   Value: NO GROWTH 2 DAYS     Performed at [facility]   Report Status 08/13/2013 FINAL   Final    Studies/Results: Dg Chest Port 1 View  08/14/2013   CLINICAL DATA:  Respiratory failure.  EXAM: PORTABLE CHEST - 1 VIEW  COMPARISON:  Chest radiograph 08/10/2013.  FINDINGS: ET tube terminates 2.2 cm superior to the carina. NG tube tip and side-port course inferior to the diaphragm, tip not included on this examination. Right IJ central venous catheter tip projects over the superior vena cava. Stable cardiac and  mediastinal contours. No significant interval change in multiple bilateral nodular pulmonary opacities. No definite pleural effusion or pneumothorax.  IMPRESSION: ET tube terminates 2.2 cm superior to the carina  Chest radiograph with unchanged bilateral nodular pulmonary opacities, potentially representing multi focal septic emboli.   Electronically Signed   By: Annia Belt M.D.   On: 08/14/2013 07:34      Assessment/Plan: Kurtis Anastasia is a 52 y.o. 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  #3 Dry gangrene of digits secondary to pressors: VVS following  #4 Hep C; check genotype   #5 TTPEnia from DIC resolving  #6 ARF: HD in the am    LOS: 10 days   Acey Lav 08/14/2013, 2:11 PM

## 2013-08-14 NOTE — Procedures (Signed)
Extubation Procedure Note  Patient Details:   Name: Timothy Kline DOB: 1961/04/15 MRN: [id number]   Airway Documentation:     Evaluation  O2 sats: stable throughout Complications: No apparent complications Patient did tolerate procedure well. Bilateral Breath Sounds: Rhonchi Suctioning: Airway Yes  Pt extubated at this time per MD order and placed on 3L [place]. Pt tolerated well. No complications. Pt was able to breathe around deflated cuff. VS stable. Pt has strong, adequate cough. No stridor noted. RT will continue to monitor.    Loyal Jacobson [facility] 08/14/2013, 9:51 AM

## 2013-08-14 NOTE — Progress Notes (Signed)
eLink Physician-Brief Progress Note Patient Name: Timothy Kline DOB: [birth year] MRN: [id number]  Date of Service  08/14/2013   HPI/Events of Note   Pain w dressing change  eICU Interventions  Fentanyl prn   Intervention Category Intermediate Interventions: Pain - evaluation and management  [name] S. 08/14/2013, 11:07 PM

## 2013-08-14 NOTE — Progress Notes (Signed)
S:pt extubated, conversant, no c/o R arm pain  O:Blood pressure 97/61, pulse 78, temperature 98.3 F (36.8 C), temperature source Core (Comment), resp. rate 22, weight 93.5 kg (206 lb 2.1 oz), SpO2 100.00%.  R Arm antecubital wound:  ~quarter sized full thickness skin defect, no pus expressed with compression proximally and distally, underlying muscle visible - pink, viable  A:s/p I&D R arm - improved   P:cont moist to dry gently packing, may consider vac per wound care vs continued packing.

## 2013-08-14 NOTE — Progress Notes (Signed)
PULMONARY  / CRITICAL CARE MEDICINE  Name: Ezekial Arns MRN: [id number] DOB: [birth year]    ADMISSION DATE:  08/04/2013 CONSULTATION DATE:  10/31  REFERRING MD :  [facility] PRIMARY SERVICE: PCCM  CHIEF COMPLAINT:  Respiratory Failure/ Sepsis/ Renal Failure  BRIEF PATIENT DESCRIPTION: [name] presented to [facility] 10/28 with AMS/Sepsis in setting of cocaine /oxycodone use. Pt admitted with Respiratory Failure (ARDS)/Sepsis has developed acute renal failure, thrombocytopenia, a-fib Flutter with RVR and was transferred to [facility] on 10/31 for HD and further care. CCM asked to admit.  SIGNIFICANT EVENTS / STUDIES:  10/28 Admission to [facility] 10/29 Metabolic Acidosis, pH=6.9 10/31 Transferred to [facility] for HD. 10/31 Cardioversion for Afib/Flutter rate 170's ( Unsuccessful).  Amiodarone initiated for Afib/Flutter RVR 10/31 Renal consult: CRRT initiated 10/31 Heme consult: TCP likely due to DIC. Doubt TTP/HUS 11/01 Echocardiogram: LVEF 20-25%. Diffuse HK 11/01 ID consult 11/01 Skin biopsy: thrombotic vasculopathic reaction  11/2 tolerated HD, off pressors.  Platelet transfusion for thrombocytopenia  11/3 CXR with new pulmonary opacities/nodules.  Concern for septic emboli.  11/03 CT chest: Multilobar bilateral areas of pulmonary parenchymal nodular consolidation with internal necrosis/ cavitation and suggestion of surrounding hazy opacity. The appearance is most typical for septic emboli 11/03 CT head: NAD 11/03 BCx from [place] 2/2 + for FUSOBACTERIUM NECROPHORUM.   11/04 CT neck (noncontrasted): No evidence of jugular vein thrombosis, abscesses, inflammation.   11/05 Jugular vein [place]: No thrombus noted 11/04 Converted to NSR from Afib/Aflutter  11/05 Dexmedetomidine initiated in anticipation of extubation soon.   11/06 C Diff negative, Tracheal aspirate few yeast  11/06 Continued fevers, restart vanc for possible MRSA PNA 11/07 Sinus Tract R antecubital arm at site  of previous punch bx discovered  11/08 MRI R forearm/elbow/humerus: inadequate study due to inability to cooperate 11/09 Debridement and lavage of RUE wound (Coley).  No joint space infxn  11/09 Hypotension > norepi. Acute blood loss anemia (probably post op) > 2 units RBCs. Weaned off norepi after RBCs 11/09 Vasc surg consult for digital gangrene.  No acute intervention, allow demarcation, possible amputation in future   LINES / TUBES: R IJ CVL 10/28 >>  ETT 10/29 >>  R femoral HD Cath 11/01 >>>  CULTURES: [facility] >> + Fusobacterium Necrophorum BCx2 10/31>>> Negative Sputum 10/31>> Few yeast Tracheal Asp. 11/05>> Rare WBC, no organisms  C Diff 11/5 >> Negative  RUE wound 11/7 >> Negative   ANTIBIOTICS: Rocephin: 10/28?>>>10/31 Levoquin:   10/28>>>10/31 Vancomycin 10/28>>> 11/3 Cefepime 10/31>>> 11/3 Flagyl 11/2 >> 11/3  Meropenem 11/3 >>  Vancomycin 11/6 >>   VITAL SIGNS: Temp:  [96.8 F (36 C)-101.4 F (38.6 C)] 97.5 F (36.4 C) (11/10 0700) Pulse Rate:  [62-144] 75 (11/10 0700) Resp:  [8-32] 17 (11/10 0700) BP: (81-128)/(52-76) 87/52 mmHg (11/10 0700) SpO2:  [89 %-100 %] 100 % (11/10 0700) FiO2 (%):  [40 %] 40 % (11/10 0410)  HEMODYNAMICS:    VENTILATOR SETTINGS: Vent Mode:  [-] PCV FiO2 (%):  [40 %] 40 % Set Rate:  [15 bmp-16 bmp] 15 bmp PEEP:  [5 cmH20] 5 cmH20 Pressure Support:  [10 cmH20] 10 cmH20 Plateau Pressure:  [13 cmH20-19 cmH20] 14 cmH20  INTAKE / OUTPUT: Intake/Output     11/09 0701 - 11/10 0700 11/10 0701 - 11/11 0700   I.V. (mL/kg) 1201.7 (12.9)    Blood 650    NG/GT 1110    IV Piggyback 100    Total Intake(mL/kg) 3061.7 (32.7)  Urine (mL/kg/hr) 2510 (1.1)    Other     Stool 825 (0.4)    Total Output 3335     Net -273.3           PHYSICAL EXAMINATION: General: RASS 0 to -1.  HEENT: NCAT, ETT in place PULM: scattered rhonchi CV: RRR  Ab: Soft, NABS, NT Ext: Multiple gangrenous digits on B/L hands, B/L feet Neuro:  diffusely weak, no focal deficits noted  LABS: I personally reviewed the lab work from the past 24 hours in comparison to previous and the relevant findings can be found in the A/P.   CXR 08/14/13 -  Improved aeration annular infiltrates rt base, ett wnl, line wnl  ASSESSMENT / PLAN:  PULMONARY A:  Acute Respiratory Failure Suspect COPD Septic pulmonary emboli P:   -sbt today, cpap5 ps 5, goal 2 hrs -if fails weaning , will consider trach tues /wed, if weans well will need ABG, rsbi -pcxr in am  CARDIOVASCULAR A:  Septic shock, resolved Hypovolemic shock, resolved AFRVR - Converted 08/08/13 Cardiomyopathy (EF 20-25%) Digital Dry Gangrene  P:  Amiodarone per pharmacy, if remains in SR further, as fib under stress circumstances, would consider dc amio Cont PRN metoprolol to maintain HR < 115/min Consider repeat Echo prior to discharge VVS consult, appreciate recs.  Let distal digits demarcate, then possible amputation No anticoagualtion, given heme  RENAL A:   Acute Renal Failure, improving Metabolic Acidosis, resolved Oliguria, resolved Hypernatremia  P:  Renal following, HD as needed   Monitor BMET intermittently Correct electrolytes as indicated Consider free water as he makes urine  GASTROINTESTINAL A:   Hypoalbuminemia Protein-calorie malnutrition P:   SUP:  Cont TFs, may need to hold if weaning well  HEMATOLOGIC A:   Anemia, ABL  (Type and Screen 11/9) DIC - resolved  Thrombocytopenia, improving  fib P:  Monitor CBC Transfuse for Hgb < 7.0 or acute blood loss with hypotension coags now for possible trach May be able to add sub q heoparin soon, follow pla ttrend and coags  INFECTIOUS A:   Severe sepsis Fusobacterium bacteremia with septic emmbolization RUE soft tissue wound MRSA PNA, clinically resolved Hep C Positive  Digital Dry Gangrene  P:   Micro and abx as above Abx per ID Post op mgmt (RUE) per Dr Izora Ribas  ENDOCRINE A: Mild  hyperglycemia without prior dx of DM P:   CBG q 6 hrs SSI for glu > 180   NEUROLOGIC A:  Acute encephalopathy - Resolved  Acute and chronic pain P:   Continue Precedex and fentanyl Daily WUA upright  Bryan R. Hess, DO of [facility] 08/14/2013, 7:55 AM  I have fully examined this patient and agree with above findings.    aned edited infull  Ccm time 30 min  Mcarthur Rossetti. Tyson Alias, MD, FACP Pgr: [phone] [facility]

## 2013-08-15 ENCOUNTER — Inpatient Hospital Stay (HOSPITAL_COMMUNITY): Payer: BC Managed Care – PPO

## 2013-08-15 LAB — CBC WITH DIFFERENTIAL/PLATELET
Basophils Absolute: 0 10*3/uL (ref 0.0–0.1)
Eosinophils Absolute: 0.1 10*3/uL (ref 0.0–0.7)
Hemoglobin: 6.8 g/dL — CL (ref 13.0–17.0)
Lymphocytes Relative: 5 % — ABNORMAL LOW (ref 12–46)
MCV: 89.8 fL (ref 78.0–100.0)
Monocytes Absolute: 0.5 10*3/uL (ref 0.1–1.0)
Neutro Abs: 15.6 10*3/uL — ABNORMAL HIGH (ref 1.7–7.7)
Platelets: 234 10*3/uL (ref 150–400)
RBC: 2.26 MIL/uL — ABNORMAL LOW (ref 4.22–5.81)
WBC: 7 10*3/uL (ref 4.0–10.5)

## 2013-08-15 LAB — COMPREHENSIVE METABOLIC PANEL
Albumin: 1.4 g/dL — ABNORMAL LOW (ref 3.5–5.2)
BUN: 148 mg/dL — ABNORMAL HIGH (ref 6–23)
Calcium: 6.7 mg/dL — ABNORMAL LOW (ref 8.4–10.5)
Chloride: 105 mEq/L (ref 96–112)
Creatinine, Ser: 3.41 mg/dL — ABNORMAL HIGH (ref 0.50–1.35)
GFR calc Af Amer: 22 mL/min — ABNORMAL LOW (ref >90)
Glucose, Bld: 106 mg/dL — ABNORMAL HIGH (ref 70–99)
Total Bilirubin: 1.1 mg/dL (ref 0.3–1.2)
Total Protein: 5.4 g/dL — ABNORMAL LOW (ref 6.0–8.3)

## 2013-08-15 LAB — TYPE AND SCREEN
ABO/RH(D): O POS
Antibody Screen: NEGATIVE
Unit division: 0

## 2013-08-15 LAB — GLUCOSE, CAPILLARY
Glucose-Capillary: 102 mg/dL — ABNORMAL HIGH (ref 70–99)
Glucose-Capillary: 111 mg/dL — ABNORMAL HIGH (ref 70–99)
Glucose-Capillary: 120 mg/dL — ABNORMAL HIGH (ref 70–99)
Glucose-Capillary: 121 mg/dL — ABNORMAL HIGH (ref 70–99)

## 2013-08-15 LAB — PREPARE RBC (CROSSMATCH)

## 2013-08-15 MED ORDER — VANCOMYCIN HCL IN DEXTROSE 1-5 GM/200ML-% IV SOLN
1000.0000 mg | INTRAVENOUS | Status: DC
  Administered 2013-08-15 – 2013-08-17 (×3): 1000 mg via INTRAVENOUS
  Filled 2013-08-15 (×3): qty 200

## 2013-08-15 MED ORDER — POTASSIUM CHLORIDE 10 MEQ/50ML IV SOLN
10.0000 meq | INTRAVENOUS | Status: AC
  Administered 2013-08-15 (×4): 10 meq via INTRAVENOUS
  Filled 2013-08-15 (×2): qty 50

## 2013-08-15 MED ORDER — MEROPENEM 500 MG IV SOLR
500.0000 mg | Freq: Two times a day (BID) | INTRAVENOUS | Status: DC
  Administered 2013-08-15 – 2013-08-21 (×14): 500 mg via INTRAVENOUS
  Filled 2013-08-15 (×17): qty 0.5

## 2013-08-15 NOTE — Evaluation (Signed)
Physical Therapy Evaluation Patient Details Name: Timothy Kline MRN: [id number] DOB: 17-Jul-1961 Today's Date: 08/15/2013 Time: 2952-8413 PT Time Calculation (min): 22 min  PT Assessment / Plan / Recommendation History of Present Illness  52 year old male presented to [facility] 10/28 with AMS/Sepsis in setting of cocaine /oxycodone use. Pt admitted with Respiratory Failure (ARDS)/Sepsis has developed acute renal failure, thrombocytopenia, a-fib Flutter with RVR and was transferred to [facility] on 10/31 for HD and further care. Pt with multisystem organ failure requiring dialysis; intubated until 11/09; had punch biopsy of skin lesion Rt antecubital space that later required I&D by ortho due to incr drainage; + DIC with septic pulmonary emboli and necrotizing of tips of all his digits (also due to use of vasopressors); cultures + for Fusobacterium Necrophorum  Clinical Impression  Pt admitted with above diagnoses. Pt has been critically ill and immobile since admission resulting in generalized weakness. As he is medically able to increase activity, he will undoubtedly have balance and mobility deficits related to his weakness, and also due to his ischemic digits x 4 extremities. Pt currently with functional limitations due to the deficits listed below (see PT Problem List). Pt will benefit from skilled PT to increase their independence and safety with mobility to allow discharge to the venue listed below.       PT Assessment  Patient needs continued PT services    Follow Up Recommendations  CIR (once medically appropriate to participate)    Does the patient have the potential to tolerate intense rehabilitation      Barriers to Discharge Other (comment) (TBA)      Equipment Recommendations  Other (comment) (TBD)    Recommendations for Other Services OT consult   Frequency Min 3X/week    Precautions / Restrictions Precautions Precautions: Other (comment) Precaution Comments: temporary Rt  femoral HD catheter   Pertinent Vitals/Pain Pt denied pain throughout session VSS on ICU monitors      Mobility  Bed Mobility Bed Mobility: Not assessed Transfers Transfers: Not assessed Details for Transfer Assistance: pt with temp femoral HD catheter, therefore unable to assess sitting or standing    Exercises General Exercises - Lower Extremity Ankle Circles/Pumps: AROM;Both;10 reps;Supine Short Arc Quad: AROM;Both;5 reps;Supine Heel Slides: AAROM;Left;5 reps;Supine Hip ABduction/ADduction: AAROM;Left;5 reps;Supine Shoulder Exercises Shoulder Flexion: AAROM;Both;5 reps;Supine Shoulder ABduction: AAROM;Left;Supine Shoulder External Rotation: AROM;Left;5 reps;Supine Elbow Flexion: AAROM;Left;5 reps;Supine   PT Diagnosis: Generalized weakness  PT Problem List: Decreased strength;Decreased range of motion;Decreased activity tolerance;Decreased mobility;Decreased cognition;Decreased knowledge of use of DME;Decreased skin integrity PT Treatment Interventions: DME instruction;Gait training;Functional mobility training;Therapeutic activities;Therapeutic exercise;Cognitive remediation;Patient/family education (as pt medically appropriate for all activities)     PT Goals(Current goals can be found in the care plan section) Acute Rehab PT Goals Patient Stated Goal: to get stronger and get OOB PT Goal Formulation: With patient Time For Goal Achievement: 08/18/13 Potential to Achieve Goals: Good  Visit Information  Last PT Received On: 08/15/13 Assistance Needed: +1 (for bed-level ex's) History of Present Illness: 52 year old male presented to [facility] 10/28 with AMS/Sepsis in setting of cocaine /oxycodone use. Pt admitted with Respiratory Failure (ARDS)/Sepsis has developed acute renal failure, thrombocytopenia, a-fib Flutter with RVR and was transferred to [facility] on 10/31 for HD and further care. Pt with multisystem organ failure requiring dialysis; intubated until 11/09; had  punch biopsy of skin lesion Rt antecubital space that later required I&D by ortho due to incr drainage; + DIC with septic pulmonary emboli and necrotizing of tips  of all his digits (also due to use of vasopressors); cultures + for Fusobacterium Necrophorum       Prior Functioning  Home Living Additional Comments: TBA Prior Function Level of Independence: Independent Communication Communication: Other (comment) (hoarse; low volume post-extubation)    Cognition  Cognition Arousal/Alertness: Awake/alert Behavior During Therapy: Flat affect Overall Cognitive Status: Impaired/Different from baseline Area of Impairment: Orientation Orientation Level: Time    Extremity/Trunk Assessment Upper Extremity Assessment Upper Extremity Assessment: RUE deficits/detail;LUE deficits/detail RUE Deficits / Details: bandaged from mid-humerus to wrist; AAROM shoulder flexion to 90; elbow deferred due to bandage/wound; pt able to move most digits a trace amount (PROM deferred due to black, ischemic digits) LUE Deficits / Details: Lt forearm bandaged circumferentially; AAROM shoulder to 90, external rotation to 70, elbow to 120, hand/fingers deferred due to ischemia Lower Extremity Assessment Lower Extremity Assessment: RLE deficits/detail;LLE deficits/detail RLE Deficits / Details: AAROM hip limited to 30 flexion due to femoral HD catheter; AAROM knee flexion to 30 with pt able to extend knee against gravity (SAQ), ankle dorsiflexion 3-/5 with passive ROM to neutral LLE Deficits / Details: AAROM hip flexion to 90, hip internal rotation to neutral/full external rotation, knee to 120, ankle to neutral; hip flexion 2+/5, knee extension 3/5   Balance Balance Balance Assessed: No  End of Session PT - End of Session Activity Tolerance: Patient tolerated treatment well Patient left: in bed;with call bell/phone within reach Nurse Communication: Other (comment) (unable to dangle EOB due to fem HD catheter)  GP      Jinger Middlesworth 08/15/2013, 2:11 PM Pager [phone]

## 2013-08-15 NOTE — Progress Notes (Signed)
Critical value of Hgb 6.8 called to elink  Loraine Leriche RN who will relay to MD. MD unavailable.

## 2013-08-15 NOTE — Progress Notes (Signed)
eLink Physician-Brief Progress Note Patient Name: [name] DOB: [birth year] MRN: [id number]  Date of Service  08/15/2013   HPI/Events of Note  Hypokalemia   eICU Interventions  Potassium replaced   Intervention Category Minor Interventions: Electrolytes abnormality - evaluation and management  Rishi Vicario 08/15/2013, 6:46 AM

## 2013-08-15 NOTE — Progress Notes (Signed)
Rehab Admissions Coordinator Note:  Patient was screened by Timothy Kline for appropriateness for an Inpatient Acute Rehab Consult.  At this time, we are recommending await further therapy treatments before determining rehab venue options. Will need PT and OT participation before consulting.. I will follow pt at a distance.  Timothy Kline 08/15/2013, 2:31 PM  I can be reached at [phone].

## 2013-08-15 NOTE — Progress Notes (Signed)
eLink Physician-Brief Progress Note Patient Name: Abem Shaddix DOB: 1960/10/07 MRN: [id number]  Date of Service  08/15/2013   HPI/Events of Note  Hgb 6.8 down from 7.1    eICU Interventions  Plan: Transfuse 1 [facility]   Intervention Category Intermediate Interventions: Bleeding - evaluation and treatment with blood products  Christie Copley 08/15/2013, 5:27 AM
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
## 2013-08-15 NOTE — Progress Notes (Signed)
[facility]    Day # 9  Meropenem Day # 6 vancomycin  Subjective: Wants reposition in bed   Antibiotics:  Anti-infectives   Start     Dose/Rate Route Frequency Ordered Stop   08/15/13 0800  vancomycin (VANCOCIN) IVPB 1000 mg/200 mL premix     1,000 mg 200 mL/hr over 60 Minutes Intravenous Every 24 hours 08/15/13 0744     08/15/13 0800  meropenem (MERREM) 500 mg in sodium chloride 0.9 % 50 mL IVPB     500 mg 100 mL/hr over 30 Minutes Intravenous Every 12 hours 08/15/13 0749     08/12/13 1637  polymyxin B 500,000 Units, bacitracin 50,000 Units in sodium chloride irrigation 0.9 % 500 mL irrigation  Status:  Discontinued       As needed 08/12/13 1638 08/12/13 1703   08/12/13 1300  vancomycin (VANCOCIN) IVPB 1000 mg/200 mL premix     1,000 mg 200 mL/hr over 60 Minutes Intravenous  Once 08/12/13 1147 08/12/13 1441   08/11/13 1830  meropenem (MERREM) 1 g in sodium chloride 0.9 % 100 mL IVPB  Status:  Discontinued     1 g 200 mL/hr over 30 Minutes Intravenous Every 24 hours 08/11/13 1145 08/15/13 0749   08/11/13 1100  vancomycin (VANCOCIN) IVPB 750 mg/150 ml premix     750 mg 150 mL/hr over 60 Minutes Intravenous  Once 08/11/13 1026 08/11/13 1321   08/10/13 1745  vancomycin (VANCOCIN) IVPB 750 mg/150 ml premix     750 mg 150 mL/hr over 60 Minutes Intravenous  Once 08/10/13 1734 08/10/13 1920   08/07/13 1830  meropenem (MERREM) 500 mg in sodium chloride 0.9 % 50 mL IVPB  Status:  Discontinued     500 mg 100 mL/hr over 30 Minutes Intravenous Every 24 hours 08/07/13 1745 08/11/13 1145   08/06/13 2000  vancomycin (VANCOCIN) IVPB 750 mg/150 ml premix     750 mg 150 mL/hr over 60 Minutes Intravenous  Once 08/06/13 1056 08/07/13 0330   08/05/13 2200  ceFEPIme (MAXIPIME) 1 g in dextrose 5 % 50 mL IVPB  Status:  Discontinued     1 g 100 mL/hr over 30 Minutes Intravenous Every 24 hours 08/04/13 1815 08/07/13 1656   08/05/13 1200  metroNIDAZOLE (FLAGYL) IVPB 500  mg  Status:  Discontinued     500 mg 100 mL/hr over 60 Minutes Intravenous Every 8 hours 08/05/13 1049 08/07/13 1656   08/04/13 1630  ceFEPIme (MAXIPIME) 2 g in dextrose 5 % 50 mL IVPB     2 g 100 mL/hr over 30 Minutes Intravenous  Once 08/04/13 1604 08/04/13 1747      Medications: Scheduled Meds: . antiseptic oral rinse  15 mL Mouth Rinse BID  . feeding supplement (OXEPA)  1,000 mL Per Tube Q24H  . feeding supplement (PRO-STAT SUGAR FREE 64)  60 mL Per Tube TID  . free water  200 mL Per Tube Q8H  . meropenem (MERREM) IV  500 mg Intravenous Q12H  . pantoprazole (PROTONIX) IV  40 mg Intravenous QHS  . vancomycin  1,000 mg Intravenous Q24H   Continuous Infusions: . dexmedetomidine Stopped (08/14/13 0900)  . dextrose 75 mL/hr at 08/14/13 1227  . fentaNYL infusion INTRAVENOUS Stopped (08/14/13 0900)   PRN Meds:.sodium chloride, acetaminophen (TYLENOL) oral liquid 160 mg/5 mL, albuterol, anticoagulant sodium citrate, fentaNYL, fentaNYL, heparin, influenza vac split quadrivalent PF, lidocaine (PF), lidocaine-prilocaine, metoprolol, midazolam, pentafluoroprop-tetrafluoroeth, pneumococcal 23 valent vaccine   Objective: Weight change:  Intake/Output Summary (Last 24 hours) at 08/15/13 1613 Last data filed at 08/15/13 1326  Gross per 24 hour  Intake 3227.5 ml  Output   2275 ml  Net  952.5 ml   Blood pressure 126/72, pulse 89, temperature 99.1 F (37.3 C), temperature source Core (Comment), resp. rate 21, height 6' (1.829 m), weight 203 lb 14.8 oz (92.5 kg), SpO2 100.00%. Temp:  [98.8 F (37.1 C)-100.3 F (37.9 C)] 99.1 F (37.3 C) (11/11 1145) Pulse Rate:  [73-136] 89 (11/11 1145) Resp:  [0-31] 21 (11/11 1145) BP: (120-147)/(67-87) 126/72 mmHg (11/11 1145) SpO2:  [99 %-100 %] 100 % (11/11 1145) Weight:  [203 lb 14.8 oz (92.5 kg)] 203 lb 14.8 oz (92.5 kg) (11/11 0454)  Physical Exam: General: poor dentition HEENT: anicteric sclera,, EOMI, right neck with central line CVS  tachycardic rate, irr irr  no murmur rubs or gallops Chest:  rhonchi Abdomen: softnondistended, ? Tenderness to palpation of abdomennormal bowel sounds, Rectal pouch with copious brown stool Extremities: Purpuric rash prominent especially on lower extremities w necrotic changes  RUE: dressed in bandage  Skin: cyanotic dry gangrene of fingers, toes bilaterally  Neuro: nonfocal,  Lab Results:  Recent Labs  08/14/13 0500 08/15/13 0500  WBC 21.8* 7.0  HGB 7.1* 6.8*  HCT 20.8* 20.3*  PLT 170 234    BMET  Recent Labs  08/14/13 0500 08/15/13 0500  NA 149* 143  K 3.7 3.2*  CL 112 105  CO2 22 21  GLUCOSE 130* 106*  BUN 161* 148*  CREATININE 3.54* 3.41*  CALCIUM 7.0* 6.7*    Micro Results: Recent Results (from the past 240 hour(s))  CLOSTRIDIUM DIFFICILE BY PCR     Status: None   Collection Time    08/09/13  1:40 PM      Result Value Range Status   C difficile by pcr NEGATIVE  NEGATIVE Final  CULTURE, RESPIRATORY (NON-EXPECTORATED)     Status: None   Collection Time    08/09/13  4:00 PM      Result Value Range Status   Specimen Description ENDOTRACHEAL   Final   Special Requests Normal   Final   Gram Stain     Final   Value: RARE WBC PRESENT, PREDOMINANTLY PMN     NO SQUAMOUS EPITHELIAL CELLS SEEN     NO ORGANISMS SEEN     Performed at [facility]   Culture     Final   Value: FEW YEAST CONSISTENT WITH CANDIDA SPECIES     Performed at [facility]   Report Status 08/12/2013 FINAL   Final  WOUND CULTURE     Status: None   Collection Time    08/11/13 10:38 AM      Result Value Range Status   Specimen Description WOUND ARM RIGHT   Final   Special Requests NONE   Final   Gram Stain     Final   Value: NO WBC SEEN     NO SQUAMOUS EPITHELIAL CELLS SEEN     NO ORGANISMS SEEN     Performed at [facility]   Culture     Final   Value: NO GROWTH 2 DAYS     Performed at [facility]   Report Status 08/13/2013 FINAL   Final     Studies/Results: Dg Chest Port 1 View  08/15/2013   CLINICAL DATA:  Evaluate pulmonary edema  EXAM: PORTABLE CHEST - 1 VIEW  COMPARISON:  08/14/2013; 08/10/2013; 08/07/2013; chest CT -  08/07/2013  FINDINGS: Grossly unchanged cardiac silhouette and mediastinal contours. Interval extubation and removal of enteric tube. Otherwise, stable positioning of remaining support apparatus. No pneumothorax. Grossly unchanged bilateral mid and lower lung heterogeneous airspace opacities 1 of which within the periphery of the right lower lung appears to demonstrate central cavitation. No new focal airspace opacities. Suspect a trace left-sided effusion. Unchanged bones, including sequela of old right clavicular fracture.  IMPRESSION: 1. Interval extubation and removal of enteric tube. Otherwise, stable position of support apparatus. No pneumothorax. 2. Grossly unchanged bilateral mid and lower lung predominant nodular airspace opacities again worrisome for septic emboli.   Electronically Signed   By: Simonne Come M.D.   On: 08/15/2013 07:50   Dg Chest Port 1 View  08/14/2013   CLINICAL DATA:  Respiratory failure.  EXAM: PORTABLE CHEST - 1 VIEW  COMPARISON:  Chest radiograph 08/10/2013.  FINDINGS: ET tube terminates 2.2 cm superior to the carina. NG tube tip and side-port course inferior to the diaphragm, tip not included on this examination. Right IJ central venous catheter tip projects over the superior vena cava. Stable cardiac and mediastinal contours. No significant interval change in multiple bilateral nodular pulmonary opacities. No definite pleural effusion or pneumothorax.  IMPRESSION: ET tube terminates 2.2 cm superior to the carina  Chest radiograph with unchanged bilateral nodular pulmonary opacities, potentially representing multi focal septic emboli.   Electronically Signed   By: Annia Belt M.D.   On: 08/14/2013 07:34      Assessment/Plan: Timothy Kline is a 52 y.o. male with   Hx of IVDU, Hepatitis C  admitted with septic shock from FUSOBACTERIUM NECROPHORUM and with multifocal cavitary pneumonia, also with a fistulating PURULENT soft tissue infection +/- deep infection of the arm   #1 FISTULATING soft tissue +/- deeper abscess osteo/septic joint Right arm:  - greatly appreciate Orthopedics performing I and D wound culture with NGTD  --continue vancomcycin back on board with meropenem   #2 Fusobacterium Bacteremia and septic shock:CT neck S contrast without clot and Doppler without clot  --continue MERREM   #2 Multifocal PNA:  Due to Fusobacterium likely with Lemierres and embolization of organisms to lung,  +/- other anerobes, perhaps GNR, GPC.  #3 Dry gangrene of digits secondary to pressors: VVS following  #4 Hep C; check genotype   #5 TTPEnia from DIC resolved  #6 ARF: improving did nto have HD today    LOS: 11 days   Acey Lav 08/15/2013, 4:13 PM
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
## 2013-08-15 NOTE — Progress Notes (Addendum)
Timothy Kline ROUNDING NOTE   Subjective:   Interval History: no changes ischemic gangrenous toes  Objective:  Vital signs in last 24 hours:  Temp:  [98.3 F (36.8 C)-100.3 F (37.9 C)] 98.9 F (37.2 C) (11/11 0900) Pulse Rate:  [73-136] 135 (11/11 0900) Resp:  [0-25] 16 (11/11 0900) BP: (97-147)/(56-87) 136/71 mmHg (11/11 0900) SpO2:  [96 %-100 %] 100 % (11/11 0900) Weight:  [92.5 kg (203 lb 14.8 oz)] 92.5 kg (203 lb 14.8 oz) (11/11 0454)  Weight change:  Filed Weights   08/12/13 1454 08/13/13 0500 08/15/13 0454  Weight: 94.9 kg (209 lb 3.5 oz) 93.5 kg (206 lb 2.1 oz) 92.5 kg (203 lb 14.8 oz)    Intake/Output: I/O last 3 completed shifts: In: 3696.2 [P.O.:755; I.V.:2181.2; NG/GT:660; IV Piggyback:100] Out: 4225 [Urine:3625; Stool:600]   Intake/Output this shift:  Total I/O In: 500 [I.V.:150; IV Piggyback:350] Out: 575 [Urine:575]   Lungs: CTA  CVS RRR  Abdomen: Soft and not obviously tender. Soft dark brown stool in rectal tube.  Gauze dressing the over right arm wound  Skin: Progressive demarcation of digital ischemia    Basic Metabolic Panel:  Recent Labs Lab 08/09/13 0425 08/10/13 0425 08/11/13 0455 08/12/13 0419 08/13/13 0400 08/14/13 0500 08/15/13 0500  NA 143 145 143 147* 146* 149* 143  K 4.0 4.1 3.7 3.4* 3.7 3.7 3.2*  CL 101 104 105 106 108 112 105  CO2 22 23 22 21 24 22 21   GLUCOSE 184* 137* 156* 137* 136* 130* 106*  BUN 179* 180* 164* 193* 141* 161* 148*  CREATININE 4.95* 4.53* 4.16* 4.55* 3.56* 3.54* 3.41*  CALCIUM 7.2* 7.6* 7.1* 7.0* 7.0* 7.0* 6.7*  PHOS 8.8* 8.3*  --  10.4* 9.9* 10.8*  --     Liver Function Tests:  Recent Labs Lab 08/11/13 0455 08/12/13 0419 08/13/13 0400 08/14/13 0500 08/15/13 0500  AST 34  --   --   --  37  ALT 13  --   --   --  26  ALKPHOS 69  --   --   --  71  BILITOT 1.7*  --   --   --  1.1  PROT 4.6*  --   --   --  5.4*  ALBUMIN 1.4* 1.6* 1.6* 1.3* 1.4*   No results found for this  basename: LIPASE, AMYLASE,  in the last 168 hours No results found for this basename: AMMONIA,  in the last 168 hours  CBC:  Recent Labs Lab 08/11/13 0455 08/12/13 0419 08/13/13 0400 08/14/13 0500 08/15/13 0500  WBC 29.9* 32.6* 28.3* 21.8* 7.0  NEUTROABS 27.5*  --   --   --  15.6*  HGB 7.8* 7.4* 6.0* 7.1* 6.8*  HCT 23.0* 21.7* 18.6* 20.8* 20.3*  MCV 89.1 89.3 94.4 88.9 89.8  PLT 46* 76* 136* 170 234    Cardiac Enzymes: No results found for this basename: CKTOTAL, CKMB, CKMBINDEX, TROPONINI,  in the last 168 hours  BNP: No components found with this basename: POCBNP,   CBG:  Recent Labs Lab 08/13/13 2354 08/14/13 1136 08/14/13 1934 08/14/13 2354 08/15/13 0803  GLUCAP 131* 96 135* 111* 102*    Microbiology: Results for orders placed during the hospital encounter of 08/04/13  MRSA PCR SCREENING     Status: None   Collection Time    08/04/13  3:21 PM      Result Value Range Status   MRSA by PCR NEGATIVE  NEGATIVE Final   Comment:  The GeneXpert MRSA Assay (FDA     approved for NASAL specimens     only), is one component of a     comprehensive MRSA colonization     surveillance program. It is not     intended to diagnose MRSA     infection nor to guide or     monitor treatment for     MRSA infections.  CULTURE, RESPIRATORY (NON-EXPECTORATED)     Status: None   Collection Time    08/04/13  8:27 PM      Result Value Range Status   Specimen Description TRACHEAL ASPIRATE   Final   Special Requests NONE   Final   Gram Stain     Final   Value: FEW WBC PRESENT,BOTH PMN AND MONONUCLEAR     NO SQUAMOUS EPITHELIAL CELLS SEEN     NO ORGANISMS SEEN     Performed at [facility]   Culture     Final   Value: FEW YEAST CONSISTENT WITH CANDIDA SPECIES     Performed at [facility]   Report Status 08/07/2013 FINAL   Final  CULTURE, BLOOD (ROUTINE X 2)     Status: None   Collection Time    08/04/13 11:08 PM      Result Value Range Status    Specimen Description BLOOD LEFT ARM   Final   Special Requests BOTTLES DRAWN AEROBIC ONLY 2.5CC   Final   Culture  Setup Time     Final   Value: 08/05/2013 04:03     Performed at [facility]   Culture     Final   Value: NO GROWTH 5 DAYS     Performed at [facility]   Report Status 08/11/2013 FINAL   Final  CULTURE, BLOOD (ROUTINE X 2)     Status: None   Collection Time    08/04/13 11:25 PM      Result Value Range Status   Specimen Description BLOOD RIGHT CENTRAL LINE   Final   Special Requests BOTTLES DRAWN AEROBIC AND ANAEROBIC 10CC EACH   Final   Culture  Setup Time     Final   Value: 08/05/2013 04:06     Performed at [facility]   Culture     Final   Value: NO GROWTH 5 DAYS     Performed at [facility]   Report Status 08/11/2013 FINAL   Final  CLOSTRIDIUM DIFFICILE BY PCR     Status: None   Collection Time    08/09/13  1:40 PM      Result Value Range Status   C difficile by pcr NEGATIVE  NEGATIVE Final  CULTURE, RESPIRATORY (NON-EXPECTORATED)     Status: None   Collection Time    08/09/13  4:00 PM      Result Value Range Status   Specimen Description ENDOTRACHEAL   Final   Special Requests Normal   Final   Gram Stain     Final   Value: RARE WBC PRESENT, PREDOMINANTLY PMN     NO SQUAMOUS EPITHELIAL CELLS SEEN     NO ORGANISMS SEEN     Performed at [facility]   Culture     Final   Value: FEW YEAST CONSISTENT WITH CANDIDA SPECIES     Performed at [facility]   Report Status 08/12/2013 FINAL   Final  WOUND CULTURE     Status: None   Collection Time  08/11/13 10:38 AM      Result Value Range Status   Specimen Description WOUND ARM RIGHT   Final   Special Requests NONE   Final   Gram Stain     Final   Value: NO WBC SEEN     NO SQUAMOUS EPITHELIAL CELLS SEEN     NO ORGANISMS SEEN     Performed at [facility]   Culture     Final   Value: NO GROWTH 2 DAYS     Performed at [facility]   Report  Status 08/13/2013 FINAL   Final    Coagulation Studies:  Recent Labs  08/14/13 0930  LABPROT 14.8  INR 1.19    Urinalysis: No results found for this basename: COLORURINE, APPERANCEUR, LABSPEC, PHURINE, GLUCOSEU, HGBUR, BILIRUBINUR, KETONESUR, PROTEINUR, UROBILINOGEN, NITRITE, LEUKOCYTESUR,  in the last 72 hours    Imaging: Dg Chest Port 1 View  08/15/2013   CLINICAL DATA:  Evaluate pulmonary edema  EXAM: PORTABLE CHEST - 1 VIEW  COMPARISON:  08/14/2013; 08/10/2013; 08/07/2013; chest CT -08/07/2013  FINDINGS: Grossly unchanged cardiac silhouette and mediastinal contours. Interval extubation and removal of enteric tube. Otherwise, stable positioning of remaining support apparatus. No pneumothorax. Grossly unchanged bilateral mid and lower lung heterogeneous airspace opacities 1 of which within the periphery of the right lower lung appears to demonstrate central cavitation. No new focal airspace opacities. Suspect a trace left-sided effusion. Unchanged bones, including sequela of old right clavicular fracture.  IMPRESSION: 1. Interval extubation and removal of enteric tube. Otherwise, stable position of support apparatus. No pneumothorax. 2. Grossly unchanged bilateral mid and lower lung predominant nodular airspace opacities again worrisome for septic emboli.   Electronically Signed   By: Simonne Come M.D.   On: 08/15/2013 07:50   Dg Chest Port 1 View  08/14/2013   CLINICAL DATA:  Respiratory failure.  EXAM: PORTABLE CHEST - 1 VIEW  COMPARISON:  Chest radiograph 08/10/2013.  FINDINGS: ET tube terminates 2.2 cm superior to the carina. NG tube tip and side-port course inferior to the diaphragm, tip not included on this examination. Right IJ central venous catheter tip projects over the superior vena cava. Stable cardiac and mediastinal contours. No significant interval change in multiple bilateral nodular pulmonary opacities. No definite pleural effusion or pneumothorax.  IMPRESSION: ET tube  terminates 2.2 cm superior to the carina  Chest radiograph with unchanged bilateral nodular pulmonary opacities, potentially representing multi focal septic emboli.   Electronically Signed   By: Annia Belt M.D.   On: 08/14/2013 07:34     Medications:   . dexmedetomidine Stopped (08/14/13 0900)  . dextrose 75 mL/hr at 08/14/13 1227  . fentaNYL infusion INTRAVENOUS Stopped (08/14/13 0900)   . antiseptic oral rinse  15 mL Mouth Rinse BID  . feeding supplement (OXEPA)  1,000 mL Per Tube Q24H  . feeding supplement (PRO-STAT SUGAR FREE 64)  60 mL Per Tube TID  . free water  200 mL Per Tube Q8H  . meropenem (MERREM) IV  500 mg Intravenous Q12H  . pantoprazole (PROTONIX) IV  40 mg Intravenous QHS  . potassium chloride  10 mEq Intravenous Q1 Hr x 4  . vancomycin  1,000 mg Intravenous Q24H   sodium chloride, acetaminophen (TYLENOL) oral liquid 160 mg/5 mL, albuterol, anticoagulant sodium citrate, fentaNYL, fentaNYL, heparin, influenza vac split quadrivalent PF, lidocaine (PF), lidocaine-prilocaine, metoprolol, midazolam, pentafluoroprop-tetrafluoroeth, pneumococcal 23 valent vaccine  Assessment/ Plan:    [name] presented to [facility] 10/28  with AMS/Sepsis in setting of cocaine /oxycodone use. Pt admitted with Respiratory Failure (ARDS)/Sepsis has developed acute renal failure, thrombocytopenia, a-fib Flutter with RVR and was transferred to [facility] on 10/31.  CRRT started 10/31 transitioned to intermittent dialysis Last dialysis 11/9.  1. Acute renal failure with improving BUN no dialysis today 2. Hypernatremia would increase free water.continue free water for now 3. Anemia GI blood loss transfuse as necessary  4. Atrial fibrillation Amiodarone  5. PURULENT soft tissue infection +/- deep infection of the arm Treated with meropenum and Vancomycin 6. Hypokalemia is going to receive blood today therefore will not replete    LOS: 11 Timothy Kline @TODAY @9 :41 AM

## 2013-08-15 NOTE — Progress Notes (Signed)
Chaplain offered emotional and spiritual support, empathic listening, and a caring presence to pt. Pt was awake and able to communicate, but not speaking loudly. Chaplain offered to come back another time, and pt said, "I hope you will." Will follow up.  Maurene Capes [phone]

## 2013-08-16 LAB — CLOSTRIDIUM DIFFICILE BY PCR: Toxigenic C. Difficile by PCR: NEGATIVE

## 2013-08-16 LAB — CBC
HCT: 22.4 % — ABNORMAL LOW (ref 39.0–52.0)
Hemoglobin: 7.6 g/dL — ABNORMAL LOW (ref 13.0–17.0)
MCH: 30.2 pg (ref 26.0–34.0)
MCHC: 33.9 g/dL (ref 30.0–36.0)
MCV: 88.9 fL (ref 78.0–100.0)
RDW: 18 % — ABNORMAL HIGH (ref 11.5–15.5)

## 2013-08-16 LAB — BASIC METABOLIC PANEL
BUN: 124 mg/dL — ABNORMAL HIGH (ref 6–23)
Calcium: 6.7 mg/dL — ABNORMAL LOW (ref 8.4–10.5)
Chloride: 105 mEq/L (ref 96–112)
Creatinine, Ser: 3.26 mg/dL — ABNORMAL HIGH (ref 0.50–1.35)
GFR calc Af Amer: 24 mL/min — ABNORMAL LOW (ref >90)
GFR calc non Af Amer: 20 mL/min — ABNORMAL LOW (ref >90)
Glucose, Bld: 113 mg/dL — ABNORMAL HIGH (ref 70–99)
Potassium: 3.5 mEq/L (ref 3.5–5.1)

## 2013-08-16 LAB — GLUCOSE, CAPILLARY: Glucose-Capillary: 105 mg/dL — ABNORMAL HIGH (ref 70–99)

## 2013-08-16 MED ORDER — ENSURE COMPLETE PO LIQD
237.0000 mL | Freq: Two times a day (BID) | ORAL | Status: DC
  Administered 2013-08-16 – 2013-09-07 (×37): 237 mL via ORAL

## 2013-08-16 MED ORDER — METOPROLOL TARTRATE 25 MG PO TABS
25.0000 mg | ORAL_TABLET | Freq: Two times a day (BID) | ORAL | Status: DC
  Administered 2013-08-16 – 2013-08-19 (×7): 25 mg via ORAL
  Filled 2013-08-16 (×9): qty 1

## 2013-08-16 NOTE — Progress Notes (Signed)
[facility]     Day # 10  Meropenem Day # 7 vancomycin  Subjective:    Antibiotics:  Anti-infectives   Start     Dose/Rate Route Frequency Ordered Stop   08/15/13 0800  vancomycin (VANCOCIN) IVPB 1000 mg/200 mL premix     1,000 mg 200 mL/hr over 60 Minutes Intravenous Every 24 hours 08/15/13 0744     08/15/13 0800  meropenem (MERREM) 500 mg in sodium chloride 0.9 % 50 mL IVPB     500 mg 100 mL/hr over 30 Minutes Intravenous Every 12 hours 08/15/13 0749     08/12/13 1637  polymyxin B 500,000 Units, bacitracin 50,000 Units in sodium chloride irrigation 0.9 % 500 mL irrigation  Status:  Discontinued       As needed 08/12/13 1638 08/12/13 1703   08/12/13 1300  vancomycin (VANCOCIN) IVPB 1000 mg/200 mL premix     1,000 mg 200 mL/hr over 60 Minutes Intravenous  Once 08/12/13 1147 08/12/13 1441   08/11/13 1830  meropenem (MERREM) 1 g in sodium chloride 0.9 % 100 mL IVPB  Status:  Discontinued     1 g 200 mL/hr over 30 Minutes Intravenous Every 24 hours 08/11/13 1145 08/15/13 0749   08/11/13 1100  vancomycin (VANCOCIN) IVPB 750 mg/150 ml premix     750 mg 150 mL/hr over 60 Minutes Intravenous  Once 08/11/13 1026 08/11/13 1321   08/10/13 1745  vancomycin (VANCOCIN) IVPB 750 mg/150 ml premix     750 mg 150 mL/hr over 60 Minutes Intravenous  Once 08/10/13 1734 08/10/13 1920   08/07/13 1830  meropenem (MERREM) 500 mg in sodium chloride 0.9 % 50 mL IVPB  Status:  Discontinued     500 mg 100 mL/hr over 30 Minutes Intravenous Every 24 hours 08/07/13 1745 08/11/13 1145   08/06/13 2000  vancomycin (VANCOCIN) IVPB 750 mg/150 ml premix     750 mg 150 mL/hr over 60 Minutes Intravenous  Once 08/06/13 1056 08/07/13 0330   08/05/13 2200  ceFEPIme (MAXIPIME) 1 g in dextrose 5 % 50 mL IVPB  Status:  Discontinued     1 g 100 mL/hr over 30 Minutes Intravenous Every 24 hours 08/04/13 1815 08/07/13 1656   08/05/13 1200  metroNIDAZOLE (FLAGYL) IVPB 500 mg  Status:   Discontinued     500 mg 100 mL/hr over 60 Minutes Intravenous Every 8 hours 08/05/13 1049 08/07/13 1656   08/04/13 1630  ceFEPIme (MAXIPIME) 2 g in dextrose 5 % 50 mL IVPB     2 g 100 mL/hr over 30 Minutes Intravenous  Once 08/04/13 1604 08/04/13 1747      Medications: Scheduled Meds: . antiseptic oral rinse  15 mL Mouth Rinse BID  . feeding supplement (ENSURE COMPLETE)  237 mL Oral BID BM  . meropenem (MERREM) IV  500 mg Intravenous Q12H  . metoprolol tartrate  25 mg Oral BID  . vancomycin  1,000 mg Intravenous Q24H   Continuous Infusions:   PRN Meds:.sodium chloride, acetaminophen (TYLENOL) oral liquid 160 mg/5 mL, albuterol, anticoagulant sodium citrate, fentaNYL, heparin, influenza vac split quadrivalent PF, lidocaine (PF), lidocaine-prilocaine, metoprolol, pentafluoroprop-tetrafluoroeth, pneumococcal 23 valent vaccine   Objective: Weight change:   Intake/Output Summary (Last 24 hours) at 08/16/13 1725 Last data filed at 08/16/13 1100  Gross per 24 hour  Intake   1385 ml  Output   2400 ml  Net  -1015 ml   Blood pressure 135/86, pulse 85, temperature 100.3 F (37.9 C),  temperature source Core (Comment), resp. rate 22, height 6' (1.829 m), weight 203 lb 14.8 oz (92.5 kg), SpO2 88.00%. Temp:  [95.2 F (35.1 C)-100.5 F (38.1 C)] 100.3 F (37.9 C) (11/12 1420) Pulse Rate:  [85-134] 85 (11/12 1420) Resp:  [13-42] 22 (11/12 1420) BP: (122-157)/(66-89) 135/86 mmHg (11/12 1419) SpO2:  [83 %-100 %] 88 % (11/12 1420)  Physical Exam: General: poor dentition HEENT: anicteric sclera,, EOMI, right neck with central line CVS tachycardic rate, irr irr  no murmur rubs or gallops Chest:  rhonchi Abdomen: softnondistended, ? Tenderness to palpation of abdomennormal bowel sounds, Rectal pouch with copious brown stool Extremities: Purpuric rash prominent especially on lower extremities w necrotic changes  RUE: dressed in bandage  Skin: cyanotic dry gangrene of fingers, toes  bilaterally  Neuro: nonfocal,  Lab Results:  Recent Labs  08/15/13 0500 08/16/13 0410  WBC 7.0 7.0  HGB 6.8* 7.6*  HCT 20.3* 22.4*  PLT 234 171    BMET  Recent Labs  08/15/13 0500 08/16/13 0410  NA 143 142  K 3.2* 3.5  CL 105 105  CO2 21 21  GLUCOSE 106* 113*  BUN 148* 124*  CREATININE 3.41* 3.26*  CALCIUM 6.7* 6.7*    Micro Results: Recent Results (from the past 240 hour(s))  CLOSTRIDIUM DIFFICILE BY PCR     Status: None   Collection Time    08/09/13  1:40 PM      Result Value Range Status   C difficile by pcr NEGATIVE  NEGATIVE Final  CULTURE, RESPIRATORY (NON-EXPECTORATED)     Status: None   Collection Time    08/09/13  4:00 PM      Result Value Range Status   Specimen Description ENDOTRACHEAL   Final   Special Requests Normal   Final   Gram Stain     Final   Value: RARE WBC PRESENT, PREDOMINANTLY PMN     NO SQUAMOUS EPITHELIAL CELLS SEEN     NO ORGANISMS SEEN     Performed at [facility]   Culture     Final   Value: FEW YEAST CONSISTENT WITH CANDIDA SPECIES     Performed at [facility]   Report Status 08/12/2013 FINAL   Final  WOUND CULTURE     Status: None   Collection Time    08/11/13 10:38 AM      Result Value Range Status   Specimen Description WOUND ARM RIGHT   Final   Special Requests NONE   Final   Gram Stain     Final   Value: NO WBC SEEN     NO SQUAMOUS EPITHELIAL CELLS SEEN     NO ORGANISMS SEEN     Performed at [facility]   Culture     Final   Value: NO GROWTH 2 DAYS     Performed at [facility]   Report Status 08/13/2013 FINAL   Final    Studies/Results: Dg Chest Port 1 View  08/15/2013   CLINICAL DATA:  Evaluate pulmonary edema  EXAM: PORTABLE CHEST - 1 VIEW  COMPARISON:  08/14/2013; 08/10/2013; 08/07/2013; chest CT -08/07/2013  FINDINGS: Grossly unchanged cardiac silhouette and mediastinal contours. Interval extubation and removal of enteric tube. Otherwise, stable positioning of remaining  support apparatus. No pneumothorax. Grossly unchanged bilateral mid and lower lung heterogeneous airspace opacities 1 of which within the periphery of the right lower lung appears to demonstrate central cavitation. No new focal airspace opacities. Suspect a trace left-sided effusion.  Unchanged bones, including sequela of old right clavicular fracture.  IMPRESSION: 1. Interval extubation and removal of enteric tube. Otherwise, stable position of support apparatus. No pneumothorax. 2. Grossly unchanged bilateral mid and lower lung predominant nodular airspace opacities again worrisome for septic emboli.   Electronically Signed   By: Simonne Come M.D.   On: 08/15/2013 07:50      Assessment/Plan: Timothy Kline is a 52 y.o. male with   Hx of IVDU, Hepatitis C admitted with septic shock from FUSOBACTERIUM NECROPHORUM and with multifocal cavitary pneumonia, also with a fistulating PURULENT soft tissue infection +/- deep infection of the arm   #1 FISTULATING soft tissue +/- deeper abscess osteo/septic joint Right arm:  - greatly appreciate Orthopedics performing I and D wound culture with NGTD  --continue vancomcycin back on board with meropenem   #2 Fusobacterium Bacteremia and septic shock:CT neck S contrast without clot and Doppler without clot  --continue MERREM   #2 Multifocal PNA:  Due to Fusobacterium likely with Lemierres and embolization of organisms to lung,  +/- other anerobes, perhaps GNR, GPC.  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

## 2013-08-16 NOTE — Progress Notes (Signed)
Chaplain followed up with pt per pt's request, offering emotional/spiritual support, presence, and empathic listening. Pt said he was "feeling better." Pt's family, son and niece, arrived. Chaplain explained that emotional/spiritual support is available for them if they would like it. Family was appreciative.   Maurene Capes [phone]

## 2013-08-16 NOTE — Progress Notes (Signed)
[facility] ROUNDING NOTE   Subjective:   Interval History: urine output improved  Objective:  Vital signs in last 24 hours:  Temp:  [95.2 F (35.1 C)-100.5 F (38.1 C)] 99.8 F (37.7 C) (11/12 0600) Pulse Rate:  [89-134] 131 (11/12 0600) Resp:  [16-39] 34 (11/12 0600) BP: (122-149)/(66-89) 149/84 mmHg (11/12 0600) SpO2:  [99 %-100 %] 100 % (11/12 0600)  Weight change:  Filed Weights   08/12/13 1454 08/13/13 0500 08/15/13 0454  Weight: 94.9 kg (209 lb 3.5 oz) 93.5 kg (206 lb 2.1 oz) 92.5 kg (203 lb 14.8 oz)    Intake/Output: I/O last 3 completed shifts: In: 4687.5 [P.O.:1250; I.V.:2625; Blood:312.5; IV Piggyback:500] Out: 4550 [Urine:4450; Stool:100]   Intake/Output this shift:  Total I/O In: 360 [P.O.:360] Out: -   Lungs: CTA  CVS RRR  Abdomen: Soft and not obviously tender. Soft dark brown stool in rectal tube.  Gauze dressing the over right arm wound  Skin: Progressive demarcation of digital ischemia    Basic Metabolic Panel:  Recent Labs Lab 08/10/13 0425  08/12/13 0419 08/13/13 0400 08/14/13 0500 08/15/13 0500 08/16/13 0410  NA 145  < > 147* 146* 149* 143 142  K 4.1  < > 3.4* 3.7 3.7 3.2* 3.5  CL 104  < > 106 108 112 105 105  CO2 23  < > 21 24 22 21 21   GLUCOSE 137*  < > 137* 136* 130* 106* 113*  BUN 180*  < > 193* 141* 161* 148* 124*  CREATININE 4.53*  < > 4.55* 3.56* 3.54* 3.41* 3.26*  CALCIUM 7.6*  < > 7.0* 7.0* 7.0* 6.7* 6.7*  PHOS 8.3*  --  10.4* 9.9* 10.8*  --   --   < > = values in this interval not displayed.  Liver Function Tests:  Recent Labs Lab 08/11/13 0455 08/12/13 0419 08/13/13 0400 08/14/13 0500 08/15/13 0500  AST 34  --   --   --  37  ALT 13  --   --   --  26  ALKPHOS 69  --   --   --  71  BILITOT 1.7*  --   --   --  1.1  PROT 4.6*  --   --   --  5.4*  ALBUMIN 1.4* 1.6* 1.6* 1.3* 1.4*   No results found for this basename: LIPASE, AMYLASE,  in the last 168 hours No results found for this basename:  AMMONIA,  in the last 168 hours  CBC:  Recent Labs Lab 08/11/13 0455 08/12/13 0419 08/13/13 0400 08/14/13 0500 08/15/13 0500 08/16/13 0410  WBC 29.9* 32.6* 28.3* 21.8* 7.0 7.0  NEUTROABS 27.5*  --   --   --  15.6*  --   HGB 7.8* 7.4* 6.0* 7.1* 6.8* 7.6*  HCT 23.0* 21.7* 18.6* 20.8* 20.3* 22.4*  MCV 89.1 89.3 94.4 88.9 89.8 88.9  PLT 46* 76* 136* 170 234 171    Cardiac Enzymes: No results found for this basename: CKTOTAL, CKMB, CKMBINDEX, TROPONINI,  in the last 168 hours  BNP: No components found with this basename: POCBNP,   CBG:  Recent Labs Lab 08/15/13 0803 08/15/13 1133 08/15/13 1924 08/16/13 0018 08/16/13 0554  GLUCAP 102* 121* 120* 116* 106*    Microbiology: Results for orders placed during the hospital encounter of 08/04/13  MRSA PCR SCREENING     Status: None   Collection Time    08/04/13  3:21 PM      Result Value Range Status  MRSA by PCR NEGATIVE  NEGATIVE Final   Comment:            The GeneXpert MRSA Assay (FDA     approved for NASAL specimens     only), is one component of a     comprehensive MRSA colonization     surveillance program. It is not     intended to diagnose MRSA     infection nor to guide or     monitor treatment for     MRSA infections.  CULTURE, RESPIRATORY (NON-EXPECTORATED)     Status: None   Collection Time    08/04/13  8:27 PM      Result Value Range Status   Specimen Description TRACHEAL ASPIRATE   Final   Special Requests NONE   Final   Gram Stain     Final   Value: FEW WBC PRESENT,BOTH PMN AND MONONUCLEAR     NO SQUAMOUS EPITHELIAL CELLS SEEN     NO ORGANISMS SEEN     Performed at [facility]   Culture     Final   Value: FEW YEAST CONSISTENT WITH CANDIDA SPECIES     Performed at [facility]   Report Status 08/07/2013 FINAL   Final  CULTURE, BLOOD (ROUTINE X 2)     Status: None   Collection Time    08/04/13 11:08 PM      Result Value Range Status   Specimen Description BLOOD LEFT ARM    Final   Special Requests BOTTLES DRAWN AEROBIC ONLY 2.5CC   Final   Culture  Setup Time     Final   Value: 08/05/2013 04:03     Performed at [facility]   Culture     Final   Value: NO GROWTH 5 DAYS     Performed at [facility]   Report Status 08/11/2013 FINAL   Final  CULTURE, BLOOD (ROUTINE X 2)     Status: None   Collection Time    08/04/13 11:25 PM      Result Value Range Status   Specimen Description BLOOD RIGHT CENTRAL LINE   Final   Special Requests BOTTLES DRAWN AEROBIC AND ANAEROBIC 10CC EACH   Final   Culture  Setup Time     Final   Value: 08/05/2013 04:06     Performed at [facility]   Culture     Final   Value: NO GROWTH 5 DAYS     Performed at [facility]   Report Status 08/11/2013 FINAL   Final  CLOSTRIDIUM DIFFICILE BY PCR     Status: None   Collection Time    08/09/13  1:40 PM      Result Value Range Status   C difficile by pcr NEGATIVE  NEGATIVE Final  CULTURE, RESPIRATORY (NON-EXPECTORATED)     Status: None   Collection Time    08/09/13  4:00 PM      Result Value Range Status   Specimen Description ENDOTRACHEAL   Final   Special Requests Normal   Final   Gram Stain     Final   Value: RARE WBC PRESENT, PREDOMINANTLY PMN     NO SQUAMOUS EPITHELIAL CELLS SEEN     NO ORGANISMS SEEN     Performed at [facility]   Culture     Final   Value: FEW YEAST CONSISTENT WITH CANDIDA SPECIES     Performed at [facility]   Report Status  08/12/2013 FINAL   Final  WOUND CULTURE     Status: None   Collection Time    08/11/13 10:38 AM      Result Value Range Status   Specimen Description WOUND ARM RIGHT   Final   Special Requests NONE   Final   Gram Stain     Final   Value: NO WBC SEEN     NO SQUAMOUS EPITHELIAL CELLS SEEN     NO ORGANISMS SEEN     Performed at [facility]   Culture     Final   Value: NO GROWTH 2 DAYS     Performed at [facility]   Report Status 08/13/2013 FINAL   Final     Coagulation Studies:  Recent Labs  08/14/13 0930  LABPROT 14.8  INR 1.19    Urinalysis: No results found for this basename: COLORURINE, APPERANCEUR, LABSPEC, PHURINE, GLUCOSEU, HGBUR, BILIRUBINUR, KETONESUR, PROTEINUR, UROBILINOGEN, NITRITE, LEUKOCYTESUR,  in the last 72 hours    Imaging: Dg Chest Port 1 View  08/15/2013   CLINICAL DATA:  Evaluate pulmonary edema  EXAM: PORTABLE CHEST - 1 VIEW  COMPARISON:  08/14/2013; 08/10/2013; 08/07/2013; chest CT -08/07/2013  FINDINGS: Grossly unchanged cardiac silhouette and mediastinal contours. Interval extubation and removal of enteric tube. Otherwise, stable positioning of remaining support apparatus. No pneumothorax. Grossly unchanged bilateral mid and lower lung heterogeneous airspace opacities 1 of which within the periphery of the right lower lung appears to demonstrate central cavitation. No new focal airspace opacities. Suspect a trace left-sided effusion. Unchanged bones, including sequela of old right clavicular fracture.  IMPRESSION: 1. Interval extubation and removal of enteric tube. Otherwise, stable position of support apparatus. No pneumothorax. 2. Grossly unchanged bilateral mid and lower lung predominant nodular airspace opacities again worrisome for septic emboli.   Electronically Signed   By: Simonne Come M.D.   On: 08/15/2013 07:50     Medications:   . dextrose 75 mL/hr at 08/14/13 1227   . antiseptic oral rinse  15 mL Mouth Rinse BID  . meropenem (MERREM) IV  500 mg Intravenous Q12H  . pantoprazole (PROTONIX) IV  40 mg Intravenous QHS  . vancomycin  1,000 mg Intravenous Q24H   sodium chloride, acetaminophen (TYLENOL) oral liquid 160 mg/5 mL, albuterol, anticoagulant sodium citrate, fentaNYL, heparin, influenza vac split quadrivalent PF, lidocaine (PF), lidocaine-prilocaine, metoprolol, pentafluoroprop-tetrafluoroeth, pneumococcal 23 valent vaccine  Assessment/ Plan:  [name] presented to [facility] 10/28  with AMS/Sepsis in setting of cocaine /oxycodone use. Pt admitted with Respiratory Failure (ARDS)/Sepsis has developed acute renal failure, thrombocytopenia, a-fib Flutter with RVR and was transferred to [facility] on 10/31.   CRRT started 10/31 transitioned to intermittent dialysis Last dialysis 11/9.  1. Acute renal failure with improving BUN no dialysis today  2. Hypernatremia continue free water for now  3. Anemia GI blood loss transfuse as necessary  4. Atrial fibrillation Amiodarone  5. PURULENT soft tissue infection +/- deep infection of the arm Treated with meropenum and Vancomycin  6. Hypokalemia resolved  Discussed with Dr Tyson Alias will sign off anticipate return of function.      LOS: 12 Timothy Kline @TODAY @10 :21 AM

## 2013-08-16 NOTE — Progress Notes (Signed)
NUTRITION FOLLOW UP  Intervention:    Ensure Complete PO BID, each supplement provides 350 kcal and 13 grams of protein.  Nutrition Dx:   Inadequate oral intake related to dysphagia as evidenced by 75% meal completion. Ongoing.  New Goal: Intake to meet >90% of estimated nutrition needs. Unmet.  Monitor:   PO intake, labs, weight trend.  Assessment:   PMHx significant for substance abuse and Hep C. Admitted to [facility] on 10/28 with AMS/sepsis in setting of cocaine and oxycodone use. Pt admitted to [facility] on 10/31 with septic shock from FUSOBACTERIUM NECROPHORUM and with multifocal cavitary pneumonia, also with a fistulating PURULENT soft tissue infection +/- deep infection of the arm.  Renal failure is improving, UOP improving. Patient no longer requiring intermittent HD, last treatment was 11/9. Extubated on 11/10. Diet has been advanced to dysphagia 2 with thin liquids; S/P bedside swallow evaluation with SLP on 11/11. TF has been discontinued. Patient is eating okay, consuming 75% of meals. Would benefit from PO supplements to maximize intake of protein to support healing.  Height: 5\' 8"  or 172.7 cm (estimated by RN)  Weight Status:   Wt Readings from Last 1 Encounters:  08/15/13 203 lb 14.8 oz (92.5 kg)  08/11/13  213 lb 13.5 oz (97 kg)  08/05/13  235 lb 0.2 oz (106.6 kg)   Re-estimated needs:  Kcal: 2200-2400 Protein: 120-140 gm Fluid: 2.2-2.4 L  Skin: skin breakdown on feet, heels, arms, and hands.  Diet Order: Dysphagia 2 with thin liquids    Intake/Output Summary (Last 24 hours) at 08/16/13 1320 Last data filed at 08/16/13 1100  Gross per 24 hour  Intake   2405 ml  Output   2975 ml  Net   -570 ml    Last BM: 11/12   Labs:   Recent Labs Lab 08/12/13 0419 08/13/13 0400 08/14/13 0500 08/15/13 0500 08/16/13 0410  NA 147* 146* 149* 143 142  K 3.4* 3.7 3.7 3.2* 3.5  CL 106 108 112 105 105  CO2 21 24 22 21 21   BUN 193* 141* 161* 148* 124*   CREATININE 4.55* 3.56* 3.54* 3.41* 3.26*  CALCIUM 7.0* 7.0* 7.0* 6.7* 6.7*  PHOS 10.4* 9.9* 10.8*  --   --   GLUCOSE 137* 136* 130* 106* 113*    CBG (last 3)   Recent Labs  08/16/13 0018 08/16/13 0554 08/16/13 1151  GLUCAP 116* 106* 107*    Scheduled Meds: . antiseptic oral rinse  15 mL Mouth Rinse BID  . meropenem (MERREM) IV  500 mg Intravenous Q12H  . pantoprazole (PROTONIX) IV  40 mg Intravenous QHS  . vancomycin  1,000 mg Intravenous Q24H    Continuous Infusions: . dextrose 75 mL/hr at 08/14/13 1227    Joaquin Courts, RD, LDN, CNSC Pager [phone] After Hours Pager [phone]

## 2013-08-16 NOTE — Progress Notes (Signed)
PULMONARY  / CRITICAL CARE MEDICINE  Name: Timothy Kline MRN: [id number] DOB: [birth year]    ADMISSION DATE:  08/04/2013 CONSULTATION DATE:  10/31  REFERRING MD :  [facility] PRIMARY SERVICE: PCCM  CHIEF COMPLAINT:  Respiratory Failure/ Sepsis/ Renal Failure  BRIEF PATIENT DESCRIPTION: [name] presented to [facility] 10/28 with AMS/Sepsis in setting of cocaine /oxycodone use. Pt admitted with Respiratory Failure (ARDS)/Sepsis has developed acute renal failure, thrombocytopenia, a-fib Flutter with RVR and was transferred to [facility] on 10/31 for HD and further care. CCM asked to admit.  SIGNIFICANT EVENTS / STUDIES:  10/28 Admission to [facility] 10/29 Metabolic Acidosis, pH=6.9 10/31 Transferred to [facility] for HD. 10/31 Cardioversion for Afib/Flutter rate 170's ( Unsuccessful).  Amiodarone initiated for Afib/Flutter RVR 10/31 Renal consult: CRRT initiated 10/31 Heme consult: TCP likely due to DIC. Doubt TTP/HUS 11/01 Echocardiogram: LVEF 20-25%. Diffuse HK 11/01 ID consult 11/01 Skin biopsy: thrombotic vasculopathic reaction  11/2 tolerated HD, off pressors.  Platelet transfusion for thrombocytopenia  11/3 CXR with new pulmonary opacities/nodules.  Concern for septic emboli.  11/03 CT chest: Multilobar bilateral areas of pulmonary parenchymal nodular consolidation with internal necrosis/ cavitation and suggestion of surrounding hazy opacity. The appearance is most typical for septic emboli 11/03 CT head: NAD 11/03 BCx from [place] 2/2 + for FUSOBACTERIUM NECROPHORUM.   11/04 CT neck (noncontrasted): No evidence of jugular vein thrombosis, abscesses, inflammation.   11/05 Jugular vein [place]: No thrombus noted 11/04 Converted to NSR from Afib/Aflutter  11/05 Dexmedetomidine initiated in anticipation of extubation soon.   11/06 C Diff negative, Tracheal aspirate few yeast  11/06 Continued fevers, restart vanc for possible MRSA PNA 11/07 Sinus Tract R antecubital arm at site  of previous punch bx discovered  11/08 MRI R forearm/elbow/humerus: inadequate study due to inability to cooperate 11/09 Debridement and lavage of RUE wound (Coley).  No joint space infxn  11/09 Hypotension > norepi. Acute blood loss anemia (probably post op) > 2 units RBCs. Weaned off norepi after RBCs 11/09 Vasc surg consult for digital gangrene.  No acute intervention, allow demarcation, possible amputation in future  11/10 Extubated, tolerated well 11/10 Pt in PAF despite amiodarone  11/11 1 u pRBC transfused   LINES / TUBES: R IJ CVL 10/28 >>> ETT 10/29 >> 11/10  R femoral HD Cath 11/01 >>>11/12  CULTURES: [facility] >> + Fusobacterium Necrophorum BCx2 10/31>>> Negative Sputum 10/31>> Few yeast Tracheal Asp. 11/05>> Rare WBC, no organisms  C Diff 11/5 >> Negative  RUE wound 11/7 >> Negative   ANTIBIOTICS: Rocephin: 10/28?>>>10/31 Levoquin:   10/28>>>10/31 Vancomycin 10/28>>> 11/3 Cefepime 10/31>>> 11/3 Flagyl 11/2 >> 11/3  Meropenem 11/3 >>  Vancomycin 11/6 >>   VITAL SIGNS: Temp:  [95.2 F (35.1 C)-100.5 F (38.1 C)] 99.8 F (37.7 C) (11/12 1100) Pulse Rate:  [91-134] 127 (11/12 1100) Resp:  [16-42] 42 (11/12 1100) BP: (122-149)/(66-89) 129/79 mmHg (11/12 1100) SpO2:  [99 %-100 %] 100 % (11/12 1100)  HEMODYNAMICS:    VENTILATOR SETTINGS:    INTAKE / OUTPUT: Intake/Output     11/11 0701 - 11/12 0700 11/12 0701 - 11/13 0700   P.O. 720 360   I.V. (mL/kg) 1725 (18.6)    Blood 312.5    NG/GT     IV Piggyback 400    Total Intake(mL/kg) 3157.5 (34.1) 360 (3.9)   Urine (mL/kg/hr) 3500 (1.6) 350 (0.6)   Stool     Total Output 3500 350   Net -342.5 +10  PHYSICAL EXAMINATION: General: no distress HEENT: NCAT, EOMI  PULM: reduced CV: Irregular, irregular.  No murmurs appreciated   Ab: Soft, No r/g, nt Ext: Multiple gangrenous digits on B/L hands, B/L feet, demarginating better, wrap upper rt Neuro: diffusely weak, no focal deficits,  cooperative  LABS: I personally reviewed the lab work from the past 24 hours in comparison to previous and the relevant findings can be found in the A/P.   CXR 08/15/13 - Nodular opacities basically unchanged   ASSESSMENT / PLAN:  PULMONARY A:  Acute Respiratory Failure - Resolved  Suspect COPD Septic pulmonary emboli P:   - CXR this AM showing persistent nodular opacities (septic emboli) -IS  CARDIOVASCULAR A:  Septic shock, resolved Hypovolemic shock, resolved AFRVR - Converted 08/08/13.  Return on 11/10 Cardiomyopathy (EF 20-25%) Digital Dry Gangrene  Tachy, SR now P:  Add home metoprolol VVS consult, appreciate recs.  Let distal digits demarcate, then possible amputation No anticoagualtion, given heme, also avoid in endocarditis  RENAL A:   Acute Renal Failure, improving P:  Dc hd cath If bp drops, may need bolus back Chem in am  GASTROINTESTINAL A:   Hypoalbuminemia Protein-calorie malnutrition P:   Eating well, dc ppi Cdiff, if neg then immodium add  HEMATOLOGIC A:   Anemia, ABL  (Type and Screen 11/9) DIC - resolved  Thrombocytopenia - Resolved  P:  scd Add SQ heparin for DVT PPx pending resolution of Hgb  INFECTIOUS A:   Severe sepsis - Resolved  Fusobacterium bacteremia with septic emmbolization RUE soft tissue wound MRSA PNA, clinically resolved Hep C Positive  Digital Dry Gangrene  P:   Micro and abx as above Abx per ID, continue mero  ENDOCRINE A: Mild hyperglycemia without prior dx of DM P:   CBG q 6 hrs all wnl, may be able to dc ssi  NEUROLOGIC A:  Acute encephalopathy - Resolved  Acute and chronic pain P:   Pt consult fent prn [employer]il[employer] as able OT will be inmportant when [employer] R. Hess, DO of [facility] 08/16/2013, 1:51 PM  I have fully examined this patient and agree with above findings.     To med  Mcarthur Rossetti. Tyson Alias, MD, FACP Pgr: [phone] [place] [facility]

## 2013-08-17 LAB — BASIC METABOLIC PANEL
BUN: 109 mg/dL — ABNORMAL HIGH (ref 6–23)
CO2: 22 mEq/L (ref 19–32)
Calcium: 6.9 mg/dL — ABNORMAL LOW (ref 8.4–10.5)
Creatinine, Ser: 3.3 mg/dL — ABNORMAL HIGH (ref 0.50–1.35)
GFR calc non Af Amer: 20 mL/min — ABNORMAL LOW (ref >90)
Sodium: 143 mEq/L (ref 135–145)

## 2013-08-17 LAB — CBC WITH DIFFERENTIAL/PLATELET
Basophils Absolute: 0 10*3/uL (ref 0.0–0.1)
Eosinophils Absolute: 0.2 10*3/uL (ref 0.0–0.7)
Lymphs Abs: 0.8 10*3/uL (ref 0.7–4.0)
MCH: 30.4 pg (ref 26.0–34.0)
MCHC: 33.5 g/dL (ref 30.0–36.0)
MCV: 90.9 fL (ref 78.0–100.0)
Monocytes Absolute: 0.6 10*3/uL (ref 0.1–1.0)
Monocytes Relative: 17 % — ABNORMAL HIGH (ref 3–12)
Neutro Abs: 2.2 10*3/uL (ref 1.7–7.7)
Neutrophils Relative %: 55 % (ref 43–77)
Platelets: 120 10*3/uL — ABNORMAL LOW (ref 150–400)
RDW: 18.2 % — ABNORMAL HIGH (ref 11.5–15.5)
WBC: 3.8 10*3/uL — ABNORMAL LOW (ref 4.0–10.5)

## 2013-08-17 LAB — GLUCOSE, CAPILLARY
Glucose-Capillary: 109 mg/dL — ABNORMAL HIGH (ref 70–99)
Glucose-Capillary: 96 mg/dL (ref 70–99)

## 2013-08-17 MED ORDER — OXYCODONE HCL 5 MG PO TABS
5.0000 mg | ORAL_TABLET | ORAL | Status: DC | PRN
  Administered 2013-08-17 – 2013-09-07 (×69): 5 mg via ORAL
  Filled 2013-08-17 (×69): qty 1

## 2013-08-17 MED ORDER — WHITE PETROLATUM GEL
Status: AC
  Administered 2013-08-17: 01:00:00
  Filled 2013-08-17: qty 5

## 2013-08-17 MED ORDER — LOPERAMIDE HCL 2 MG PO CAPS
4.0000 mg | ORAL_CAPSULE | ORAL | Status: DC | PRN
  Filled 2013-08-17: qty 2

## 2013-08-17 NOTE — Progress Notes (Signed)
[facility]  Subjective  -   Patient states that he is resting comfortably.  He denies significant pain in his toes.   Physical Exam:  Patient now extubated and breathing comfortably. 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 Dry gangrene of all 5 toes on the right with extension of the necrotic tissue onto the plantar surface of the foot.  The patient has reactive hyperemia but no overt infection.       Assessment/Plan: Bilateral digital ischemia secondary to hypotension and septic shock  The patient has dry gangrene of all 10 toes.  It is more severe on the right as it extends onto the plantar surface.  No obvious evidence of infection on bilateral toes.  I discussed proceeding with most likely a right transmetatarsal amputation and digital/rate amputation of all 5 toes on the left.  There is no pressing issue to get this done as he does not have evidence of infection.  There is also the possibility that these could auto amputate.  However I feel the most expeditious thing would be to proceed with amputation.  I like to coordinate this with any additional surgery he will have for his hands.  I will be away tomorrow and the weekend will have one of my partners check on him tomorrow.  Doratha Mcswain IV, V. WELLS 08/17/2013 4:18 PM --  Filed Vitals:   08/17/13 1500  BP: 134/75  Pulse: 64  Temp:   Resp: 31    Intake/Output Summary (Last 24 hours) at 08/17/13 1618 Last data filed at 08/17/13 1500  Gross per 24 hour  Intake   1220 ml  Output   2900 ml  Net  -1680 ml     Laboratory CBC    Component Value Date/Time   WBC 3.8* 08/17/2013 0500   HGB 7.0* 08/17/2013 0500   HCT 20.9* 08/17/2013 0500   PLT 120* 08/17/2013 0500    BMET    Component Value Date/Time   NA 143 08/17/2013 0500   K 3.5 08/17/2013 0500   CL 108 08/17/2013 0500   CO2 22 08/17/2013 0500   GLUCOSE 101* 08/17/2013 0500   BUN 109*  08/17/2013 0500   CREATININE 3.30* 08/17/2013 0500   CALCIUM 6.9* 08/17/2013 0500   GFRNONAA 20* 08/17/2013 0500   GFRAA 23* 08/17/2013 0500    COAG Lab Results  Component Value Date   INR 1.19 08/14/2013   INR 1.36 08/08/2013   INR 1.55* 08/06/2013   No results found for this basename: PTT    Antibiotics Anti-infectives   Start     Dose/Rate Route Frequency Ordered Stop   08/15/13 0800  vancomycin (VANCOCIN) IVPB 1000 mg/200 mL premix  Status:  Discontinued     1,000 mg 200 mL/hr over 60 Minutes Intravenous Every 24 hours 08/15/13 0744 08/17/13 0931   08/15/13 0800  meropenem (MERREM) 500 mg in sodium chloride 0.9 % 50 mL IVPB     500 mg 100 mL/hr over 30 Minutes Intravenous Every 12 hours 08/15/13 0749     08/12/13 1637  polymyxin B 500,000 Units, bacitracin 50,000 Units in sodium chloride irrigation 0.9 % 500 mL irrigation  Status:  Discontinued       As needed 08/12/13 1638 08/12/13 1703   08/12/13 1300  vancomycin (VANCOCIN) IVPB 1000 mg/200 mL premix     1,000 mg 200 mL/hr over 60 Minutes Intravenous  Once 08/12/13 1147 08/12/13 1441   08/11/13 1830  meropenem (MERREM) 1 g in sodium chloride 0.9 % 100 mL IVPB  Status:  Discontinued     1 g 200 mL/hr over 30 Minutes Intravenous Every 24 hours 08/11/13 1145 08/15/13 0749   08/11/13 1100  vancomycin (VANCOCIN) IVPB 750 mg/150 ml premix     750 mg 150 mL/hr over 60 Minutes Intravenous  Once 08/11/13 1026 08/11/13 1321   08/10/13 1745  vancomycin (VANCOCIN) IVPB 750 mg/150 ml premix     750 mg 150 mL/hr over 60 Minutes Intravenous  Once 08/10/13 1734 08/10/13 1920   08/07/13 1830  meropenem (MERREM) 500 mg in sodium chloride 0.9 % 50 mL IVPB  Status:  Discontinued     500 mg 100 mL/hr over 30 Minutes Intravenous Every 24 hours 08/07/13 1745 08/11/13 1145   08/06/13 2000  vancomycin (VANCOCIN) IVPB 750 mg/150 ml premix     750 mg 150 mL/hr over 60 Minutes Intravenous  Once 08/06/13 1056 08/07/13 0330   08/05/13 2200   ceFEPIme (MAXIPIME) 1 g in dextrose 5 % 50 mL IVPB  Status:  Discontinued     1 g 100 mL/hr over 30 Minutes Intravenous Every 24 hours 08/04/13 1815 08/07/13 1656   08/05/13 1200  metroNIDAZOLE (FLAGYL) IVPB 500 mg  Status:  Discontinued     500 mg 100 mL/hr over 60 Minutes Intravenous Every 8 hours 08/05/13 1049 08/07/13 1656   08/04/13 1630  ceFEPIme (MAXIPIME) 2 g in dextrose 5 % 50 mL IVPB     2 g 100 mL/hr over 30 Minutes Intravenous  Once 08/04/13 1604 08/04/13 1747       V. Charlena Cross, M.D. [facility] [place] Office: [phone] Pager:  [phone]

## 2013-08-17 NOTE — Progress Notes (Signed)
Physical Therapy Treatment Patient Details Name: Ona Rathert MRN: [id number] DOB: 1960-12-27 Today's Date: 08/17/2013 Time: 5784-6962 PT Time Calculation (min): 27 min  PT Assessment / Plan / Recommendation  History of Present Illness 52 year old male presented to [facility] 10/28 with AMS/Sepsis in setting of cocaine /oxycodone use. Pt admitted with Respiratory Failure (ARDS)/Sepsis has developed acute renal failure, thrombocytopenia, a-fib Flutter with RVR and was transferred to [facility] on 10/31 for HD and further care. Pt with multisystem organ failure requiring dialysis; intubated until 11/09; had punch biopsy of skin lesion Rt antecubital space that later required I&D by ortho due to incr drainage; + DIC with septic pulmonary emboli and necrotizing of tips of all his digits (also due to use of vasopressors); cultures + for Fusobacterium Necrophorum   PT Comments   Pt eager to attempt OOB however upon sitting EOB he reported incr back pain (despite premedication for pain) and dizziness and ultimately returned to bed. Pt did sit EOB ~9 minutes total with bil feet on floor and denied pain in his feet. Unclear what the timeframe is for surgery/amputations, therefore somewhat difficult to determine post-acute d/c recommendations. Anticipate pt will be appropriate for inpatient rehab at some point.   Follow Up Recommendations  CIR     Does the patient have the potential to tolerate intense rehabilitation     Barriers to Discharge        Equipment Recommendations  Other (comment) (TBD-pending amputations)    Recommendations for Other Services    Frequency Min 3X/week   Progress towards PT Goals Progress towards PT goals: Progressing toward goals (goals added now that mobility possible)  Plan Current plan remains appropriate    Precautions / Restrictions Precautions Precautions: Fall Precaution Comments: femoral HD catheter d/c'd 11/12   Pertinent Vitals/Pain VSS on ICU monitor  throughout session; BP increased with sit EOB and not the cause for patient's reports of dizziness    Mobility  Bed Mobility Bed Mobility: Rolling Left;Left Sidelying to Sit;Sitting - Scoot to [place] of Bed;Sit to Sidelying Left;Scooting to [facility] Rolling Left: 2: Max assist Left Sidelying to Sit: 1: +2 Total assist Left Sidelying to Sit: Patient Percentage: 30% Sitting - Scoot to Edge of Bed: 2: Max assist Sit to Sidelying Left: 1: +2 Total assist Sit to Sidelying Left: Patient Percentage: 40% Scooting to HOB: 1: +2 Total assist Scooting to [facility]: Patient Percentage: 0% Details for Bed Mobility Assistance: pt slow to follow instructions for bed mobility; assist to move legs, raise torso, maintain balance while scoot to EOB when pt reported pain in his scrotum and asked to lie down to readjust; assisted to sidelying, repositioned and returned to sit EOB for 2nd time; pt reporting back pain and asking to return to bed Transfers Transfers: Not assessed    Exercises General Exercises - Lower Extremity Ankle Circles/Pumps: AROM;Both;10 reps;Supine Long Arc Quad: AROM;Both;Other reps (comment);Seated Heel Slides: AAROM;Both;Other reps (comment);Supine   PT Diagnosis:    PT Problem List:   PT Treatment Interventions:     PT Goals (current goals can now be found in the care plan section) Acute Rehab PT Goals PT Goal Formulation: With patient Time For Goal Achievement: 08/25/13 Potential to Achieve Goals: Good  Visit Information  Last PT Received On: 08/17/13 Assistance Needed: +2 History of Present Illness: 52 year old male presented to [facility] 10/28 with AMS/Sepsis in setting of cocaine /oxycodone use. Pt admitted with Respiratory Failure (ARDS)/Sepsis has developed acute renal failure, thrombocytopenia, a-fib Flutter  with RVR and was transferred to [facility] on 10/31 for HD and further care. Pt with multisystem organ failure requiring dialysis; intubated until 11/09; had punch biopsy of  skin lesion Rt antecubital space that later required I&D by ortho due to incr drainage; + DIC with septic pulmonary emboli and necrotizing of tips of all his digits (also due to use of vasopressors); cultures + for Fusobacterium Necrophorum    Subjective Data  Subjective: "I want to get out of this bed"   Cognition  Cognition Arousal/Alertness: Awake/alert Behavior During Therapy: Flat affect Overall Cognitive Status: Impaired/Different from baseline Area of Impairment: Problem solving Orientation Level: Time Problem Solving: Slow processing;Decreased initiation    Balance  Balance Balance Assessed: Yes Static Sitting Balance Static Sitting - Balance Support: Feet supported;No upper extremity supported Static Sitting - Level of Assistance: 4: Min assist  End of Session PT - End of Session Activity Tolerance: Patient limited by pain Patient left: in bed;with call bell/phone within reach Nurse Communication: Mobility status;Need for lift equipment   GP     Kayelee Herbig 08/17/2013, 3:21 PM Pager [phone]

## 2013-08-17 NOTE — Progress Notes (Signed)
eLink Physician-Brief Progress Note Patient Name: Timothy Kline DOB: August 16, 1961 MRN: [id number]  Date of Service  08/17/2013   HPI/Events of Note   Hgb 7.0  Heme pos stools  eICU Interventions  F/u Cbc in AM   Intervention Category Intermediate Interventions: Bleeding - evaluation and treatment with blood products  Shan Levans 08/17/2013, 6:29 PM

## 2013-08-17 NOTE — Significant Event (Signed)
Called E-link concerning for positive occult blood card.  Will continue to monitor, labs in for the AM.

## 2013-08-17 NOTE — Progress Notes (Signed)
[facility]     Day # 11  Meropenem Day # 8 vancomycin  Subjective:    Antibiotics:  Anti-infectives   Start     Dose/Rate Route Frequency Ordered Stop   08/15/13 0800  vancomycin (VANCOCIN) IVPB 1000 mg/200 mL premix  Status:  Discontinued     1,000 mg 200 mL/hr over 60 Minutes Intravenous Every 24 hours 08/15/13 0744 08/17/13 0931   08/15/13 0800  meropenem (MERREM) 500 mg in sodium chloride 0.9 % 50 mL IVPB     500 mg 100 mL/hr over 30 Minutes Intravenous Every 12 hours 08/15/13 0749     08/12/13 1637  polymyxin B 500,000 Units, bacitracin 50,000 Units in sodium chloride irrigation 0.9 % 500 mL irrigation  Status:  Discontinued       As needed 08/12/13 1638 08/12/13 1703   08/12/13 1300  vancomycin (VANCOCIN) IVPB 1000 mg/200 mL premix     1,000 mg 200 mL/hr over 60 Minutes Intravenous  Once 08/12/13 1147 08/12/13 1441   08/11/13 1830  meropenem (MERREM) 1 g in sodium chloride 0.9 % 100 mL IVPB  Status:  Discontinued     1 g 200 mL/hr over 30 Minutes Intravenous Every 24 hours 08/11/13 1145 08/15/13 0749   08/11/13 1100  vancomycin (VANCOCIN) IVPB 750 mg/150 ml premix     750 mg 150 mL/hr over 60 Minutes Intravenous  Once 08/11/13 1026 08/11/13 1321   08/10/13 1745  vancomycin (VANCOCIN) IVPB 750 mg/150 ml premix     750 mg 150 mL/hr over 60 Minutes Intravenous  Once 08/10/13 1734 08/10/13 1920   08/07/13 1830  meropenem (MERREM) 500 mg in sodium chloride 0.9 % 50 mL IVPB  Status:  Discontinued     500 mg 100 mL/hr over 30 Minutes Intravenous Every 24 hours 08/07/13 1745 08/11/13 1145   08/06/13 2000  vancomycin (VANCOCIN) IVPB 750 mg/150 ml premix     750 mg 150 mL/hr over 60 Minutes Intravenous  Once 08/06/13 1056 08/07/13 0330   08/05/13 2200  ceFEPIme (MAXIPIME) 1 g in dextrose 5 % 50 mL IVPB  Status:  Discontinued     1 g 100 mL/hr over 30 Minutes Intravenous Every 24 hours 08/04/13 1815 08/07/13 1656   08/05/13 1200  metroNIDAZOLE  (FLAGYL) IVPB 500 mg  Status:  Discontinued     500 mg 100 mL/hr over 60 Minutes Intravenous Every 8 hours 08/05/13 1049 08/07/13 1656   08/04/13 1630  ceFEPIme (MAXIPIME) 2 g in dextrose 5 % 50 mL IVPB     2 g 100 mL/hr over 30 Minutes Intravenous  Once 08/04/13 1604 08/04/13 1747      Medications: Scheduled Meds: . antiseptic oral rinse  15 mL Mouth Rinse BID  . feeding supplement (ENSURE COMPLETE)  237 mL Oral BID BM  . meropenem (MERREM) IV  500 mg Intravenous Q12H  . metoprolol tartrate  25 mg Oral BID   Continuous Infusions:   PRN Meds:.sodium chloride, acetaminophen (TYLENOL) oral liquid 160 mg/5 mL, albuterol, anticoagulant sodium citrate, fentaNYL, influenza vac split quadrivalent PF, lidocaine (PF), lidocaine-prilocaine, loperamide, metoprolol, oxyCODONE, pentafluoroprop-tetrafluoroeth, pneumococcal 23 valent vaccine   Objective: Weight change:   Intake/Output Summary (Last 24 hours) at 08/17/13 1540 Last data filed at 08/17/13 1500  Gross per 24 hour  Intake   1220 ml  Output   2900 ml  Net  -1680 ml   Blood pressure 134/75, pulse 64, temperature 100 F (37.8 C), temperature source Core (  Comment), resp. rate 31, height 6' (1.829 m), weight 203 lb 14.8 oz (92.5 kg), SpO2 100.00%. Temp:  [99.4 F (37.4 C)-101.1 F (38.4 C)] 100 F (37.8 C) (11/13 1200) Pulse Rate:  [64-107] 64 (11/13 1500) Resp:  [0-40] 31 (11/13 1500) BP: (113-144)/(68-90) 134/75 mmHg (11/13 1500) SpO2:  [94 %-100 %] 100 % (11/13 1500)  Physical Exam: General: poor dentition HEENT: anicteric sclera,, EOMI, right neck with central line CVS tachycardic rate, irr irr  no murmur rubs or gallops Chest:  rhonchi Abdomen: softnondistended, ? Tenderness to palpation of abdomennormal bowel sounds, Rectal pouch with copious brown stool Extremities: Purpuric rash prominent especially on lower extremities w necrotic changes  RUE: dressed in bandage  Skin: cyanotic dry gangrene of fingers, toes  bilaterally  Neuro: nonfocal,  Lab Results:  Recent Labs  08/16/13 0410 08/17/13 0500  WBC 7.0 3.8*  HGB 7.6* 7.0*  HCT 22.4* 20.9*  PLT 171 120*    BMET  Recent Labs  08/16/13 0410 08/17/13 0500  NA 142 143  K 3.5 3.5  CL 105 108  CO2 21 22  GLUCOSE 113* 101*  BUN 124* 109*  CREATININE 3.26* 3.30*  CALCIUM 6.7* 6.9*    Micro Results: Recent Results (from the past 240 hour(s))  CLOSTRIDIUM DIFFICILE BY PCR     Status: None   Collection Time    08/09/13  1:40 PM      Result Value Range Status   C difficile by pcr NEGATIVE  NEGATIVE Final  CULTURE, RESPIRATORY (NON-EXPECTORATED)     Status: None   Collection Time    08/09/13  4:00 PM      Result Value Range Status   Specimen Description ENDOTRACHEAL   Final   Special Requests Normal   Final   Gram Stain     Final   Value: RARE WBC PRESENT, PREDOMINANTLY PMN     NO SQUAMOUS EPITHELIAL CELLS SEEN     NO ORGANISMS SEEN     Performed at [facility]   Culture     Final   Value: FEW YEAST CONSISTENT WITH CANDIDA SPECIES     Performed at [facility]   Report Status 08/12/2013 FINAL   Final  WOUND CULTURE     Status: None   Collection Time    08/11/13 10:38 AM      Result Value Range Status   Specimen Description WOUND ARM RIGHT   Final   Special Requests NONE   Final   Gram Stain     Final   Value: NO WBC SEEN     NO SQUAMOUS EPITHELIAL CELLS SEEN     NO ORGANISMS SEEN     Performed at [facility]   Culture     Final   Value: NO GROWTH 2 DAYS     Performed at [facility]   Report Status 08/13/2013 FINAL   Final  CLOSTRIDIUM DIFFICILE BY PCR     Status: None   Collection Time    08/16/13  5:26 PM      Result Value Range Status   C difficile by pcr NEGATIVE  NEGATIVE Final    Studies/Results: No results found.    Assessment/Plan: Timothy Kline is a 52 y.o. male with   Hx of IVDU, Hepatitis C admitted with septic shock from FUSOBACTERIUM NECROPHORUM and with  multifocal cavitary pneumonia, also with a fistulating PURULENT soft tissue infection +/- deep infection of the arm   #1 FISTULATING soft tissue +/-  deeper abscess osteo/septic joint Right arm:  - greatly appreciate Orthopedics performing I and D wound culture with NGTD  Primary team has narrowed back to  meropenem   Will ned to watch wound carefully since we don not know if MRSA or Coag Neg staph might not be involved in this infection  #2 Fusobacterium Bacteremia and septic shock:CT neck S contrast without clot and Doppler without clot  --continue MERREM   #2 Multifocal PNA:  Due to Fusobacterium likely with [place] and embolization of organisms to lung,  +/- other anerobes, perhaps GNR, GPC.  #3 Dry gangrene of digits secondary to pressors: VVS following demarginating  #4 Hep C; check genotype   #5 TTPEnia from DIC resolved  #6 ARF: stable   LOS: 13 days   Acey Lav 08/17/2013, 3:40 PM

## 2013-08-17 NOTE — Progress Notes (Addendum)
PULMONARY  / CRITICAL CARE MEDICINE  Name: Timothy Kline MRN: [id number] DOB: 04/14/1961    ADMISSION DATE:  08/04/2013 CONSULTATION DATE:  10/31  REFERRING MD :  [facility] PRIMARY SERVICE: PCCM  CHIEF COMPLAINT:  Respiratory Failure/ Sepsis/ Renal Failure  BRIEF PATIENT DESCRIPTION: [name] presented to [facility] 10/28 with AMS/Sepsis in setting of cocaine /oxycodone use. Pt admitted with Respiratory Failure (ARDS)/Sepsis has developed acute renal failure, thrombocytopenia, a-fib Flutter with RVR and was transferred to [facility] on 10/31 for HD and further care. CCM asked to admit.  SIGNIFICANT EVENTS / STUDIES:  10/28 Admission to [facility] 10/29 Metabolic Acidosis, pH=6.9 10/31 Transferred to [facility] for HD. 10/31 Cardioversion for Afib/Flutter rate 170's ( Unsuccessful).  Amiodarone initiated for Afib/Flutter RVR 10/31 Renal consult: CRRT initiated 10/31 Heme consult: TCP likely due to DIC. Doubt TTP/HUS 11/01 Echocardiogram: LVEF 20-25%. Diffuse HK 11/01 ID consult 11/01 Skin biopsy: thrombotic vasculopathic reaction  11/2 tolerated HD, off pressors.  Platelet transfusion for thrombocytopenia  11/3 CXR with new pulmonary opacities/nodules.  Concern for septic emboli.  11/03 CT chest: Multilobar bilateral areas of pulmonary parenchymal nodular consolidation with internal necrosis/ cavitation and suggestion of surrounding hazy opacity. The appearance is most typical for septic emboli 11/03 CT head: NAD 11/03 BCx from [place] 2/2 + for FUSOBACTERIUM NECROPHORUM.   11/04 CT neck (noncontrasted): No evidence of jugular vein thrombosis, abscesses, inflammation.   11/05 Jugular vein [place]: No thrombus noted 11/04 Converted to NSR from Afib/Aflutter  11/05 Dexmedetomidine initiated in anticipation of extubation soon.   11/06 C Diff negative, Tracheal aspirate few yeast  11/06 Continued fevers, restart vanc for possible MRSA PNA 11/07 Sinus Tract R antecubital arm at site  of previous punch bx discovered  11/08 MRI R forearm/elbow/humerus: inadequate study due to inability to cooperate 11/09 Debridement and lavage of RUE wound (Coley).  No joint space infxn  11/09 Hypotension > norepi. Acute blood loss anemia (probably post op) > 2 units RBCs. Weaned off norepi after RBCs 11/09 Vasc surg consult for digital gangrene.  No acute intervention, allow demarcation, possible amputation in future  11/10 Extubated, tolerated well 11/10 Pt in PAF despite amiodarone  11/11 1 u pRBC transfused  11/12 NSR, restart Metoprolol   LINES / TUBES: R IJ CVL 10/28 >>> ETT 10/29 >> 11/10  R femoral HD Cath 11/01 >>>11/12  CULTURES: [facility] >> + Fusobacterium Necrophorum BCx2 10/31>>> Negative Sputum 10/31>> Few yeast Tracheal Asp. 11/05>> Rare WBC, no organisms  C Diff 11/5 >> Negative  RUE wound 11/7 >> Negative   ANTIBIOTICS: Rocephin: 10/28?>>>10/31 Levoquin:   10/28>>>10/31 Vancomycin 10/28>>> 11/3 Cefepime 10/31>>> 11/3 Flagyl 11/2 >> 11/3  Meropenem 11/3 >>  Vancomycin 11/6 >>   VITAL SIGNS: Temp:  [99.4 F (37.4 C)-101.1 F (38.4 C)] 99.7 F (37.6 C) (11/13 0600) Pulse Rate:  [71-128] 71 (11/13 0600) Resp:  [13-42] 19 (11/13 0600) BP: (113-157)/(70-105) 125/76 mmHg (11/13 0600) SpO2:  [83 %-100 %] 100 % (11/13 0600)  HEMODYNAMICS:    VENTILATOR SETTINGS:    INTAKE / OUTPUT: Intake/Output     11/12 0701 - 11/13 0700 11/13 0701 - 11/14 0700   P.O. 660    I.V. (mL/kg) 100 (1.1)    Blood     IV Piggyback 100    Total Intake(mL/kg) 860 (9.3)    Urine (mL/kg/hr) 3400 (1.5)    Total Output 3400     Net -2540  PHYSICAL EXAMINATION: General: no distress HEENT: NCAT, EOMI  PULM: reduced CV: RRR.  No murmurs appreciated   Ab: Soft, No r/g, nt Ext: Multiple gangrenous digits on B/L hands, B/L feet, demarginating better, wrap upper rt Neuro: diffusely weak, no focal deficits, cooperative  LABS: I personally reviewed the  lab work from the past 24 hours in comparison to previous and the relevant findings can be found in the A/P.   CXR 08/15/13 - Nodular opacities basically unchanged   ASSESSMENT / PLAN:  PULMONARY A:  Acute Respiratory Failure - Resolved  Suspect COPD Septic pulmonary emboli P:   - [place] as needed  -IS -mobilize as able, it is difficult  CARDIOVASCULAR A:  Septic shock, resolved Hypovolemic shock, resolved AFRVR - Converted 08/08/13.  Return on 11/10 Cardiomyopathy (EF 20-25%) Digital Dry Gangrene  Tachy, SR now  P:  Improved with home metoprolol VVS consult, appreciate recs,  then possible amputation No anticoagualtion, concern for emboli  RENAL A:   Acute Renal Failure, improving P:  If bp drops, may need bolus back  GASTROINTESTINAL A:   Hypoalbuminemia Protein-calorie malnutrition P:   Add imodium, C Diff negative   HEMATOLOGIC A:   Anemia, ABL  (Type and Screen 11/13) DIC - resolved  Thrombocytopenia - Resolved Early pancytopenia? P:  scd No SQ heparin Cbc with diff in am Repeat retic count Fecal occult, may need gi scope  INFECTIOUS A:   Severe sepsis - Resolved  Fusobacterium bacteremia with septic emmbolization RUE soft tissue wound MRSA PNA, clinically resolved Hep C Positive  Digital Dry Gangrene  P:   Dc vanc, resp status has done vwery well, not c/w hcap Abx per ID, continue mero  ENDOCRINE A: Mild hyperglycemia without prior dx of DM P:   CBG q 6 hrs all wnl, may be able to dc ssi  NEUROLOGIC A:  Acute encephalopathy - Resolved  Acute and chronic pain P:   Pt consult fent prn Mobilize as able OT will be inmportant when amputation Will call vasc back as appears full demarcated  Timothy R. Hess, DO of [facility] 08/17/2013, 8:18 AM  I have fully examined this patient and agree with above findings.      Timothy Kline. Tyson Alias, MD, FACP Pgr: [phone] [facility]

## 2013-08-17 NOTE — Plan of Care (Signed)
Problem: Phase III Progression Outcomes Goal: Pain controlled on oral analgesia Outcome: Progressing Oral pain medications began today, pt complains of chronic back pain Goal: Voiding independently Outcome: Progressing Condom cath applied, pt voiding without difficulity

## 2013-08-18 DIAGNOSIS — R195 Other fecal abnormalities: Secondary | ICD-10-CM | POA: Clinically undetermined

## 2013-08-18 DIAGNOSIS — B1920 Unspecified viral hepatitis C without hepatic coma: Secondary | ICD-10-CM

## 2013-08-18 DIAGNOSIS — D649 Anemia, unspecified: Secondary | ICD-10-CM | POA: Diagnosis present

## 2013-08-18 DIAGNOSIS — Z113 Encounter for screening for infections with a predominantly sexual mode of transmission: Secondary | ICD-10-CM

## 2013-08-18 LAB — GLUCOSE, CAPILLARY
Glucose-Capillary: 90 mg/dL (ref 70–99)
Glucose-Capillary: 97 mg/dL (ref 70–99)

## 2013-08-18 LAB — CBC WITH DIFFERENTIAL/PLATELET
Basophils Relative: 0 % (ref 0–1)
Eosinophils Absolute: 0.3 10*3/uL (ref 0.0–0.7)
Eosinophils Relative: 5 % (ref 0–5)
Hemoglobin: 6.6 g/dL — CL (ref 13.0–17.0)
Lymphs Abs: 0.8 10*3/uL (ref 0.7–4.0)
MCH: 29.7 pg (ref 26.0–34.0)
MCHC: 32.8 g/dL (ref 30.0–36.0)
MCV: 90.5 fL (ref 78.0–100.0)
Monocytes Absolute: 0.8 10*3/uL (ref 0.1–1.0)
Monocytes Relative: 14 % — ABNORMAL HIGH (ref 3–12)
Neutrophils Relative %: 66 % (ref 43–77)
Platelets: 115 10*3/uL — ABNORMAL LOW (ref 150–400)
RBC: 2.22 MIL/uL — ABNORMAL LOW (ref 4.22–5.81)

## 2013-08-18 LAB — CBC
MCH: 30.6 pg (ref 26.0–34.0)
MCHC: 34.3 g/dL (ref 30.0–36.0)
MCV: 89.2 fL (ref 78.0–100.0)
Platelets: 89 10*3/uL — ABNORMAL LOW (ref 150–400)
RBC: 2.97 MIL/uL — ABNORMAL LOW (ref 4.22–5.81)
WBC: 7.4 10*3/uL (ref 4.0–10.5)

## 2013-08-18 LAB — RETICULOCYTES
Retic Count, Absolute: 40 10*3/uL (ref 19.0–186.0)
Retic Ct Pct: 1.8 % (ref 0.4–3.1)

## 2013-08-18 LAB — PREPARE RBC (CROSSMATCH)

## 2013-08-18 MED ORDER — PANTOPRAZOLE SODIUM 40 MG IV SOLR
40.0000 mg | INTRAVENOUS | Status: DC
  Administered 2013-08-18 – 2013-08-23 (×6): 40 mg via INTRAVENOUS
  Filled 2013-08-18 (×6): qty 40

## 2013-08-18 NOTE — Progress Notes (Signed)
IV team was called to start a PIV.  Arrived to find that the patients right arm has a gauze dressing from the hand to mid-forearm and another dressing to the antecubital portion of the arm.  The left arm has a gauze dressing from the hand to the mid-forearm with +4 pitting edema on the exposed arm and a blood pressure cuff on the upper portion of the arm.  I recommended to [facility], that this patient should maintain his current central line until other peripheral access can be obtained.  I also recommend asking the doctor if this patient would be a candidate for a subclavian PICC line.  Consuello Masse

## 2013-08-18 NOTE — Progress Notes (Signed)
SLP Cancellation Note  Patient Details Name: Timothy Kline MRN: [id number] DOB: September 14, 1961   Cancelled treatment:        Per MD documentation pt.is NPO for possible scope today, GI consulted.  Plan to follow up over weekend if schedule allows   Royce Macadamia M.Ed [employer] [phone]   08/18/2013
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
## 2013-08-18 NOTE — Progress Notes (Addendum)
[facility]     Day # 12  Meropenem   Subjective:  No new complaints  Antibiotics:  Anti-infectives   Start     Dose/Rate Route Frequency Ordered Stop   08/15/13 0800  vancomycin (VANCOCIN) IVPB 1000 mg/200 mL premix  Status:  Discontinued     1,000 mg 200 mL/hr over 60 Minutes Intravenous Every 24 hours 08/15/13 0744 08/17/13 0931   08/15/13 0800  meropenem (MERREM) 500 mg in sodium chloride 0.9 % 50 mL IVPB     500 mg 100 mL/hr over 30 Minutes Intravenous Every 12 hours 08/15/13 0749     08/12/13 1637  polymyxin B 500,000 Units, bacitracin 50,000 Units in sodium chloride irrigation 0.9 % 500 mL irrigation  Status:  Discontinued       As needed 08/12/13 1638 08/12/13 1703   08/12/13 1300  vancomycin (VANCOCIN) IVPB 1000 mg/200 mL premix     1,000 mg 200 mL/hr over 60 Minutes Intravenous  Once 08/12/13 1147 08/12/13 1441   08/11/13 1830  meropenem (MERREM) 1 g in sodium chloride 0.9 % 100 mL IVPB  Status:  Discontinued     1 g 200 mL/hr over 30 Minutes Intravenous Every 24 hours 08/11/13 1145 08/15/13 0749   08/11/13 1100  vancomycin (VANCOCIN) IVPB 750 mg/150 ml premix     750 mg 150 mL/hr over 60 Minutes Intravenous  Once 08/11/13 1026 08/11/13 1321   08/10/13 1745  vancomycin (VANCOCIN) IVPB 750 mg/150 ml premix     750 mg 150 mL/hr over 60 Minutes Intravenous  Once 08/10/13 1734 08/10/13 1920   08/07/13 1830  meropenem (MERREM) 500 mg in sodium chloride 0.9 % 50 mL IVPB  Status:  Discontinued     500 mg 100 mL/hr over 30 Minutes Intravenous Every 24 hours 08/07/13 1745 08/11/13 1145   08/06/13 2000  vancomycin (VANCOCIN) IVPB 750 mg/150 ml premix     750 mg 150 mL/hr over 60 Minutes Intravenous  Once 08/06/13 1056 08/07/13 0330   08/05/13 2200  ceFEPIme (MAXIPIME) 1 g in dextrose 5 % 50 mL IVPB  Status:  Discontinued     1 g 100 mL/hr over 30 Minutes Intravenous Every 24 hours 08/04/13 1815 08/07/13 1656   08/05/13 1200  metroNIDAZOLE  (FLAGYL) IVPB 500 mg  Status:  Discontinued     500 mg 100 mL/hr over 60 Minutes Intravenous Every 8 hours 08/05/13 1049 08/07/13 1656   08/04/13 1630  ceFEPIme (MAXIPIME) 2 g in dextrose 5 % 50 mL IVPB     2 g 100 mL/hr over 30 Minutes Intravenous  Once 08/04/13 1604 08/04/13 1747      Medications: Scheduled Meds: . antiseptic oral rinse  15 mL Mouth Rinse BID  . feeding supplement (ENSURE COMPLETE)  237 mL Oral BID BM  . meropenem (MERREM) IV  500 mg Intravenous Q12H  . metoprolol tartrate  25 mg Oral BID  . pantoprazole (PROTONIX) IV  40 mg Intravenous Q24H   Continuous Infusions:   PRN Meds:.sodium chloride, acetaminophen (TYLENOL) oral liquid 160 mg/5 mL, albuterol, fentaNYL, influenza vac split quadrivalent PF, loperamide, metoprolol, oxyCODONE, pneumococcal 23 valent vaccine   Objective: Weight change:   Intake/Output Summary (Last 24 hours) at 08/18/13 1430 Last data filed at 08/18/13 1215  Gross per 24 hour  Intake 3282.5 ml  Output   2325 ml  Net  957.5 ml   Blood pressure 124/70, pulse 100, temperature 98.8 F (37.1 C), temperature source Axillary,  resp. rate 22, height 6' (1.829 m), weight 203 lb 14.8 oz (92.5 kg), SpO2 100.00%. Temp:  [98.4 F (36.9 C)-99.7 F (37.6 C)] 98.8 F (37.1 C) (11/14 1245) Pulse Rate:  [64-135] 100 (11/14 1245) Resp:  [11-31] 22 (11/14 1245) BP: (112-136)/(66-86) 124/70 mmHg (11/14 1245) SpO2:  [100 %] 100 % (11/14 1245)  Physical Exam: General: poor dentition HEENT: anicteric sclera,, EOMI, right neck with central line,     CVS tachycardic rate, irr irr  no murmur rubs or gallops Chest:  rhonchi Abdomen: softnondistended, ? Tenderness to palpation of abdomennormal bowel sounds, Rectal pouch with copious brown stool  Extremities:  SEE PICTURES OF WOUND LEFT ARM WITH GRANULATION TISSUE, CYANOTIC DRY GANGRENE CHAGNES DISTAL EXTREMITIES lEFT > RIGHT HAND AND bI lateral toes              Neuro: nonfocal,  Lab  Results:  Recent Labs  08/17/13 0500 08/18/13 0500  WBC 3.8* 5.4  HGB 7.0* 6.6*  HCT 20.9* 20.1*  PLT 120* 115*    BMET  Recent Labs  08/16/13 0410 08/17/13 0500  NA 142 143  K 3.5 3.5  CL 105 108  CO2 21 22  GLUCOSE 113* 101*  BUN 124* 109*  CREATININE 3.26* 3.30*  CALCIUM 6.7* 6.9*    Micro Results: Recent Results (from the past 240 hour(s))  CLOSTRIDIUM DIFFICILE BY PCR     Status: None   Collection Time    08/09/13  1:40 PM      Result Value Range Status   C difficile by pcr NEGATIVE  NEGATIVE Final  CULTURE, RESPIRATORY (NON-EXPECTORATED)     Status: None   Collection Time    08/09/13  4:00 PM      Result Value Range Status   Specimen Description ENDOTRACHEAL   Final   Special Requests Normal   Final   Gram Stain     Final   Value: RARE WBC PRESENT, PREDOMINANTLY PMN     NO SQUAMOUS EPITHELIAL CELLS SEEN     NO ORGANISMS SEEN     Performed at [facility]   Culture     Final   Value: FEW YEAST CONSISTENT WITH CANDIDA SPECIES     Performed at [facility]   Report Status 08/12/2013 FINAL   Final  WOUND CULTURE     Status: None   Collection Time    08/11/13 10:38 AM      Result Value Range Status   Specimen Description WOUND ARM RIGHT   Final   Special Requests NONE   Final   Gram Stain     Final   Value: NO WBC SEEN     NO SQUAMOUS EPITHELIAL CELLS SEEN     NO ORGANISMS SEEN     Performed at [facility]   Culture     Final   Value: NO GROWTH 2 DAYS     Performed at [facility]   Report Status 08/13/2013 FINAL   Final  CLOSTRIDIUM DIFFICILE BY PCR     Status: None   Collection Time    08/16/13  5:26 PM      Result Value Range Status   C difficile by pcr NEGATIVE  NEGATIVE Final    Studies/Results: No results found.    Assessment/Plan: Daviel Allegretto is a 52 y.o. male with   Hx of IVDU, Hepatitis C admitted with septic shock from FUSOBACTERIUM NECROPHORUM and with multifocal cavitary pneumonia, also with a  fistulating PURULENT  soft tissue infection +/- deep infection of the arm   #1 FISTULATING soft tissue +/- deeper abscess osteo/septic joint Right arm:  - greatly appreciate Orthopedics performing I and D wound culture with NGTD  Primary team has narrowed back to  meropenem   Will ned to watch wound carefully since we don not know if MRSA or Coag Neg staph might not be involved in this infection  #2 Fusobacterium Bacteremia and septic shock:CT neck S contrast without clot and Doppler without clot  --DC Central line and place PIV --check blood cultures post removal of Central line --continue MERREM   #2 Multifocal PNA:  Due to Fusobacterium likely with Lemierres and embolization of organisms to lung,  +/- other anerobes, perhaps GNR, GPC.  #3 Dry gangrene of digits secondary to pressors: VVS to coordiante with ortho hand and take to OR next week  #4 Hep C; check genotype   #5 TTPEnia from DIC resolved  #6 ARF: stable  Dr. Ninetta Lights available for questions this weekend.   LOS: 14 days   Acey Lav 08/18/2013, 2:30 PM
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
## 2013-08-19 DIAGNOSIS — I5021 Acute systolic (congestive) heart failure: Secondary | ICD-10-CM

## 2013-08-19 DIAGNOSIS — I420 Dilated cardiomyopathy: Secondary | ICD-10-CM

## 2013-08-19 DIAGNOSIS — I4891 Unspecified atrial fibrillation: Secondary | ICD-10-CM

## 2013-08-19 DIAGNOSIS — I428 Other cardiomyopathies: Secondary | ICD-10-CM

## 2013-08-19 LAB — TYPE AND SCREEN
ABO/RH(D): O POS
Antibody Screen: NEGATIVE
Unit division: 0

## 2013-08-19 LAB — BASIC METABOLIC PANEL
BUN: 88 mg/dL — ABNORMAL HIGH (ref 6–23)
CO2: 21 mEq/L (ref 19–32)
Chloride: 107 mEq/L (ref 96–112)
Creatinine, Ser: 2.89 mg/dL — ABNORMAL HIGH (ref 0.50–1.35)
GFR calc Af Amer: 27 mL/min — ABNORMAL LOW (ref >90)
GFR calc non Af Amer: 23 mL/min — ABNORMAL LOW (ref >90)
Glucose, Bld: 100 mg/dL — ABNORMAL HIGH (ref 70–99)
Potassium: 3.7 mEq/L (ref 3.5–5.1)
Sodium: 141 mEq/L (ref 135–145)

## 2013-08-19 LAB — GLUCOSE, CAPILLARY: Glucose-Capillary: 91 mg/dL (ref 70–99)

## 2013-08-19 LAB — PHOSPHORUS: Phosphorus: 7.3 mg/dL — ABNORMAL HIGH (ref 2.3–4.6)

## 2013-08-19 LAB — CBC
HCT: 29.6 % — ABNORMAL LOW (ref 39.0–52.0)
MCHC: 34.5 g/dL (ref 30.0–36.0)
MCV: 89.7 fL (ref 78.0–100.0)
Platelets: 77 10*3/uL — ABNORMAL LOW (ref 150–400)
RBC: 3.3 MIL/uL — ABNORMAL LOW (ref 4.22–5.81)
RDW: 17.7 % — ABNORMAL HIGH (ref 11.5–15.5)
WBC: 9.6 10*3/uL (ref 4.0–10.5)

## 2013-08-19 LAB — MAGNESIUM: Magnesium: 1.7 mg/dL (ref 1.5–2.5)

## 2013-08-19 MED ORDER — METOPROLOL TARTRATE 25 MG PO TABS
25.0000 mg | ORAL_TABLET | Freq: Three times a day (TID) | ORAL | Status: DC
  Administered 2013-08-19 – 2013-09-07 (×55): 25 mg via ORAL
  Filled 2013-08-19 (×59): qty 1

## 2013-08-19 NOTE — Progress Notes (Signed)
IN TO ASSESS PT FOR 2 NEW PIV'S PER STAFF RN REQUEST, TO REMOVE CENTRAL LINE. ASSESSED BOTH ARMS, NO SITE NOTED FOR USE, PT IS IN BANDAGES TO THE ELBOWS, PT STATES HE IS HAVING DIFFICULTY MOVING ARMS, SUGGEST LEAVING CENTRAL LINE AT THIS POINT, STAFF RN MADE AWARE, NO STICKS MADE.

## 2013-08-19 NOTE — Progress Notes (Signed)
[facility]  Daily Progress Note  Assessment/Planning: B toe dry gangrene, L hand dry gangrene, septic shock   No evidence of ascending cellulitis: no immediate surgical intervention needed  Dr. Myra Gianotti would like to time any possible foot surgeries with any hand surgeries to avoid additional anesthetic risks  Subjective  - 7 Days Post-Op  Pain ok  Objective Filed Vitals:   08/19/13 0300 08/19/13 0325 08/19/13 0400 08/19/13 0500  BP: 126/77  127/72 115/81  Pulse: 51  80 72  Temp:  97.7 F (36.5 C)    TempSrc:  Oral    Resp: 21  23 17   Height:      Weight:      SpO2: 100%  100% 100%    Intake/Output Summary (Last 24 hours) at 08/19/13 0832 Last data filed at 08/19/13 0500  Gross per 24 hour  Intake 1212.5 ml  Output    921 ml  Net  291.5 ml   R arm bandaged B feet: R dry gangrene extends onto plantar surface, L toe with dry gangrene without any plantar surface involvement L arm: R dry finger gangrene  Laboratory CBC    Component Value Date/Time   WBC 9.6 08/19/2013 0446   HGB 10.2* 08/19/2013 0446   HCT 29.6* 08/19/2013 0446   PLT 77* 08/19/2013 0446    BMET    Component Value Date/Time   NA 141 08/19/2013 0446   K 3.7 08/19/2013 0446   CL 107 08/19/2013 0446   CO2 21 08/19/2013 0446   GLUCOSE 100* 08/19/2013 0446   BUN 88* 08/19/2013 0446   CREATININE 2.89* 08/19/2013 0446   CALCIUM 7.5* 08/19/2013 0446   GFRNONAA 23* 08/19/2013 0446   GFRAA 27* 08/19/2013 0446    Leonides Sake, MD [facility] Office: [phone] Pager: [phone]  08/19/2013, 8:32 AM
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
## 2013-08-19 NOTE — Progress Notes (Signed)
Speech Language Pathology Treatment: Dysphagia  Patient Details Name: Timothy Kline MRN: [id number] DOB: Aug 20, 1961 Today's Date: 08/19/2013 Time: 1115-1140 SLP Time Calculation (min): 25 min  Assessment / Plan / Recommendation Clinical Impression  Pt. Seen for skilled dysphagia treatment with objective on safety and ability for possible diet texture upgrade with brother present.  Copious amount of blood on lips and in mustache (dried).  SLP removed dried blood from mustache and corners via Biotene products, blood on lower lip cannot be removed without re bleeding.  Min verbal cues for small sips thin soda via straw provided without indications of aspiration.  Mastication with small piece saltine was functional, however will defer texture upgrade until upper plate here and labial blood stable. Brother calling family to locate upper plate and bring to hospital.  Recommend continue Dys 2, thin with oral care.  ST will continue to follow.   HPI HPI: 52 year old male presented to [facility] 10/28 with AMS/Sepsis in setting of cocaine /oxycodone use. Pt admitted with Respiratory Failure (ARDS)/Sepsis has developed acute renal failure, thrombocytopenia, a-fib Flutter with RVR and was transferred to [facility] on 10/31 for HD and further care. CCM asked to admit. Pt intubated 11/1 and extubated 11/10.   Pertinent Vitals WDL's  SLP Plan  Continue with current plan of care    Recommendations Diet recommendations: Dysphagia 2 (fine chop);Thin liquid Liquids provided via: Straw;Cup Medication Administration: Whole meds with puree Supervision: Patient able to self feed;Full supervision/cueing for compensatory strategies Compensations: Slow rate;Small sips/bites Postural Changes and/or Swallow Maneuvers: Seated upright 90 degrees;Upright 30-60 min after meal              Oral Care Recommendations: Oral care Q4 per protocol Follow up Recommendations:  (TBD) Plan: Continue with current plan of care     GO     Breck Coons Jihaad Bruschi M.Ed [employer] [phone]  08/19/2013

## 2013-08-19 NOTE — Progress Notes (Signed)
[facility] PROGRESS NOTE  Timothy Kline BMW:[id number] DOB: 11-22-1960 DOA: 08/04/2013 PCP: No primary provider on file. Brief Narrative: Pt is a 52 year old male seen initially at [facility] 10/28 with AMS/Sepsis in setting of cocaine /oxycodone use. He was admitted with Respiratory Failure (ARDS)/Sepsis and developed acute renal failure, thrombocytopenia, a-fib Flutter with RVR and was transferred to [facility] service here at [facility] on 10/31 for HD and further care.  SIGNIFICANT EVENTS / STUDIES:  10/28 Admission to [facility]  10/29 Metabolic Acidosis, pH=6.9  10/31 Transferred to [facility] for HD.  10/31 Cardioversion for Afib/Flutter rate 170's ( Unsuccessful). Amiodarone initiated for Afib/Flutter RVR  10/31 Renal consult: CRRT initiated  10/31 Heme consult: TCP likely due to DIC. Doubt TTP/HUS  11/01 Echocardiogram: LVEF 20-25%. Diffuse HK  11/01 ID consult  11/01 Skin biopsy: thrombotic vasculopathic reaction  11/2 tolerated HD, off pressors. Platelet transfusion for thrombocytopenia  11/3 CXR with new pulmonary opacities/nodules. Concern for septic emboli.  11/03 CT chest: Multilobar bilateral areas of pulmonary parenchymal nodular consolidation with internal necrosis/ cavitation and suggestion of surrounding hazy opacity. The appearance is most typical for septic emboli  11/03 CT head: NAD  11/03 BCx from [place] 2/2 + for FUSOBACTERIUM NECROPHORUM.  11/04 CT neck (noncontrasted): No evidence of jugular vein thrombosis, abscesses, inflammation.  11/05 Jugular vein [place]: No thrombus noted  11/04 Converted to NSR from Afib/Aflutter  11/05 Dexmedetomidine initiated in anticipation of extubation soon.  11/06 C Diff negative, Tracheal aspirate few yeast  11/06 Continued fevers, restart vanc for possible MRSA PNA  11/07 Sinus Tract R antecubital arm at site of previous punch bx discovered  11/08 MRI R forearm/elbow/humerus: inadequate study due to inability to cooperate  11/09  Debridement and lavage of RUE wound (Coley). No joint space infxn  11/09 Hypotension > norepi. Acute blood loss anemia (probably post op) > 2 units RBCs. Weaned off norepi after RBCs  11/09 Vasc surg consult for digital gangrene. 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  -Patient's blood cultures from [place] grew Fusobacterium Necrophorum -he Was placed on antibiotics with cefepime and vancomycin (had been on Rocephin/Levaquin/Vanc at [place]) -ID was consulted and has been following patient for antibiotic recommendations -Per ID antibiotics deescalated to meropenem>> will continue, blood cultures from 10/31 so far negative -He also required vasopressors and as he improved clinically and blood pressure stabilized this were discontinued -he remains hemodynamically stable today off pressors Multifocal cavitary pneumonia /Acute respiratory failure/ARDS -Patient was on the vent through11/10 and he was extubated, respiratory status remained stable and followup today - on antibiotics as discussed above Thrombocytopenia with coagulopathy -secondary to Sepsis and DIC -As treated with antibiotics as discussed above, hematology was consulted and followed -Continue to monitor cbc, follow Anemia/Heme + stool -Per GI his anemia is multifactorial and endoscopic evaluation would not change his clinical outcome at this time. 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 -per vascular no evidence of ascending cellulitis; no immediate surgical intervention needed -Dr. Myra Gianotti would like to  time any possible foot surgeries with any hand surgeries to avoid additional anesthetic  risks a-fib Flutter with RVR  -S/P10/31 Cardioversion for Afib/Flutter rate 170's ( Unsuccessful).  -also was treated with Amiodarone  for Afib/Flutter RVR no dc'ed -continue metoprolol-poor rate control>>will consult cards for to assist management of Afib/flutter and CM Cardiomyopathy-Echocardiogram: LVEF 20-25%. -With increased peripheral edema noted, patient was followed an volume managed with dialysis per renal through 11/9 -Not on diuretics as this has been limited by Hypotension(now resolved) and poor renal function-improving but Cr still elevated>>see as discussed below Acute renal failure  -CRRT started 10/31 transitioned to intermittent dialysis Last dialysis 11/9. 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  Treated with meropenem and Vancomycin,Vanc DC'd 11/13, continue meropenem Rash/Thrombotic vasculopathic reaction -s/p Skin biopsy per derm on 11/1; thrombotic vasculopathic reaction  Diarrhea -C Diff negative11/06  - flexiseal in place, follow  Hypokalemia  Resolved, follow cocaine /oxycodone use  Hepatitis C  Lemierre syndrome Code Status: partial Family Communication: none at bedside  Disposition Plan: Pending clinical course   Consultants:  GI  Vascular  ID  Renal-s/o 11/12  Heme/onc  Dermatology  cardiololgy - eval and recs pending  Procedures: LINES / TUBES:  R IJ CVL 10/28 >>>   ETT 10/29 >> 11/10  10/31 Cardioversion for Afib/Flutter rate 170's ( Unsuccessful) R femoral HD Cath 11/01 >>>11/12 11/01 Echocardiogram: LVEF 20-25%. Diffuse HK 11/01 Skin biopsy: thrombotic vasculopathic reaction  11/09 Debridement and lavage of RUE wound (Coley). No joint space  infxn  11/10 Extubated, tolerated well  CULTURES:  [facility] >> + Fusobacterium Necrophorum  BCx2 10/31>>> Negative  Sputum 10/31>> Few yeast  Tracheal Asp. 11/05>> Rare WBC, no organisms  C Diff 11/5 >> Negative  RUE wound 11/7 >> Negative    Antibiotics: Rocephin: 10/28?>>>10/31  Levaquin: 10/28>>>10/31  Vancomycin 10/28>>> 11/3  Cefepime 10/31>>> 11/3  Flagyl 11/2 >> 11/3  Meropenem 11/3 >>  Vancomycin 11/6 >> 11/13   HPI/Subjective: Chart reviewed, pt denies any CP or sob   Objective: Filed Vitals:   08/19/13 0500  BP: 115/81  Pulse: 72  Temp:   Resp: 17    Intake/Output Summary (Last 24 hours) at 08/19/13 0923 Last data filed at 08/19/13 0500  Gross per 24 hour  Intake 1212.5 ml  Output    921 ml  Net  291.5 ml   Filed Weights   08/12/13 1454 08/13/13 0500 08/15/13 0454  Weight: 94.9 kg (209 lb 3.5 oz) 93.5 kg (206 lb 2.1 oz) 92.5 kg (203 lb 14.8 oz)   Exam:  General: alert & oriented x 3 In NAD Cardiovascular: regular, tachy, nl S1 s2 Respiratory: Decreased air entry bil, no cracles or wheezes Abdomen: soft +BS NT/ND, no masses palpable Extremities: +3 edema, dry gangrene of toes bilat. And fingers L>R dresssing over RUE wound clean and dry     Data Reviewed: Basic Metabolic Panel:  Recent Labs Lab 08/13/13 0400 08/14/13 0500 08/15/13 0500 08/16/13 0410 08/17/13 0500 08/19/13 0446  NA 146* 149* 143 142 143 141  K 3.7 3.7 3.2* 3.5 3.5 3.7  CL 108 112 105 105 108 107  CO2 24 22 21 21 22 21   GLUCOSE 136* 130* 106* 113* 101* 100*  BUN 141* 161* 148* 124* 109* 88*  CREATININE 3.56* 3.54* 3.41* 3.26* 3.30* 2.89*  CALCIUM 7.0* 7.0* 6.7* 6.7* 6.9* 7.5*  MG  --   --   --   --   --  1.7  PHOS 9.9* 10.8*  --   --   --  7.3*   Liver Function Tests:  Recent Labs Lab 08/13/13 0400 08/14/13 0500 08/15/13 0500  AST  --   --  37  ALT  --   --  26  ALKPHOS  --   --  71  BILITOT  --   --  1.1  PROT  --   --  5.4*  ALBUMIN 1.6*  1.3* 1.4*   No results found for this basename: LIPASE, AMYLASE,  in the last 168 hours No results found for this basename: AMMONIA,  in the last 168 hours CBC:  Recent Labs Lab 08/15/13 0500 08/16/13 0410 08/17/13 0500 08/18/13 0500 08/18/13 1600 08/19/13 0446  WBC 7.0 7.0 3.8* 5.4 7.4 9.6  NEUTROABS 15.6*  --  2.2 3.5  --   --   HGB 6.8* 7.6* 7.0* 6.6* 9.1* 10.2*  HCT 20.3* 22.4* 20.9* 20.1* 26.5* 29.6*  MCV 89.8 88.9 90.9 90.5 89.2 89.7  PLT 234 171 120* 115* 89* 77*   Cardiac Enzymes: No results found for this basename: CKTOTAL, CKMB, CKMBINDEX, TROPONINI,  in the last 168 hours BNP (last 3 results) No results found for this basename: PROBNP,  in the last 8760 hours CBG:  Recent Labs Lab 08/18/13 0015 08/18/13 0601 08/18/13 1154 08/18/13 2022 08/19/13 0001  GLUCAP 90 97 87 118* 92    Recent Results (from the past 240 hour(s))  CLOSTRIDIUM DIFFICILE BY PCR     Status: None   Collection Time    08/09/13  1:40 PM      Result Value Range Status   C difficile by pcr NEGATIVE  NEGATIVE Final  CULTURE, RESPIRATORY (NON-EXPECTORATED)     Status: None   Collection Time    08/09/13  4:00 PM      Result Value Range Status   Specimen Description ENDOTRACHEAL   Final   Special Requests Normal   Final   Gram Stain     Final   Value: RARE WBC PRESENT, PREDOMINANTLY PMN     NO SQUAMOUS EPITHELIAL CELLS SEEN     NO ORGANISMS SEEN     Performed at [facility]   Culture     Final   Value: FEW YEAST CONSISTENT WITH CANDIDA SPECIES     Performed at [facility]   Report Status 08/12/2013 FINAL   Final  WOUND CULTURE     Status: None   Collection Time    08/11/13 10:38 AM      Result Value Range Status   Specimen Description WOUND ARM RIGHT   Final   Special Requests NONE   Final   Gram Stain     Final   Value: NO WBC SEEN     NO SQUAMOUS EPITHELIAL CELLS SEEN     NO ORGANISMS SEEN     Performed at [facility]   Culture     Final   Value:  NO GROWTH 2 DAYS     Performed at [facility]   Report Status 08/13/2013 FINAL   Final  CLOSTRIDIUM DIFFICILE BY PCR     Status: None   Collection Time    08/16/13  5:26 PM      Result Value Range Status   C difficile by pcr NEGATIVE  NEGATIVE Final     Studies: No results found.  Scheduled Meds: . antiseptic oral rinse  15 mL Mouth Rinse BID  . feeding supplement (  ENSURE COMPLETE)  237 mL Oral BID BM  . meropenem (MERREM) IV  500 mg Intravenous Q12H  . metoprolol tartrate  25 mg Oral BID  . 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 If 7PM-7AM, please contact night-coverage at [email or web address], password [facility] 08/19/2013, 9:23 AM  LOS: 15 days

## 2013-08-19 NOTE — Progress Notes (Signed)
Subjective: No acute events.  Hungry.  Objective: Vital signs in last 24 hours: Temp:  [97.7 F (36.5 C)-98.9 F (37.2 C)] 97.7 F (36.5 C) (11/15 0325) Pulse Rate:  [51-130] 72 (11/15 0500) Resp:  [11-26] 17 (11/15 0500) BP: (107-134)/(66-89) 115/81 mmHg (11/15 0500) SpO2:  [100 %] 100 % (11/15 0500) Last BM Date: 08/17/13  Intake/Output from previous day: 11/14 0701 - 11/15 0700 In: 1262.5 [P.O.:220; I.V.:160; Blood:662.5; IV Piggyback:100] Out: 921 [Urine:821; Stool:100] Intake/Output this shift:    General appearance: alert and no distress GI: soft, non-tender; bowel sounds normal; no masses,  no organomegaly  Lab Results:  Recent Labs  08/18/13 0500 08/18/13 1600 08/19/13 0446  WBC 5.4 7.4 9.6  HGB 6.6* 9.1* 10.2*  HCT 20.1* 26.5* 29.6*  PLT 115* 89* 77*   BMET  Recent Labs  08/17/13 0500 08/19/13 0446  NA 143 141  K 3.5 3.7  CL 108 107  CO2 22 21  GLUCOSE 101* 100*  BUN 109* 88*  CREATININE 3.30* 2.89*  CALCIUM 6.9* 7.5*   LFT No results found for this basename: PROT, ALBUMIN, AST, ALT, ALKPHOS, BILITOT, BILIDIR, IBILI,  in the last 72 hours PT/INR No results found for this basename: LABPROT, INR,  in the last 72 hours Hepatitis Panel No results found for this basename: HEPBSAG, HCVAB, HEPAIGM, HEPBIGM,  in the last 72 hours C-Diff No results found for this basename: CDIFFTOX,  in the last 72 hours Fecal Lactopherrin No results found for this basename: FECLLACTOFRN,  in the last 72 hours  Studies/Results: No results found.  Medications:  Scheduled: . antiseptic oral rinse  15 mL Mouth Rinse BID  . feeding supplement (ENSURE COMPLETE)  237 mL Oral BID BM  . meropenem (MERREM) IV  500 mg Intravenous Q12H  . metoprolol tartrate  25 mg Oral BID  . pantoprazole (PROTONIX) IV  40 mg Intravenous Q24H   Continuous:   Assessment/Plan: 1) Anemia. 2) Heme positive stools. 3) Dry gangrene in fingers and toes. 4) Tachycardia.   Hgb has  increased with transfusion.  No overt source of bleeding, i.e., hematochezia, melena, or hematemesis.  Clinically stable at this time.  Plan: 1) Hold on any endoscopic procedures at this time.  I agree with Dr. Leone Payor that his anemia is multifactorial and endoscopic evaluation would not change his clinical outcome at this time.   2) Monitor HGB.  LOS: 15 days   Timothy Kline D 08/19/2013, 8:13 AM

## 2013-08-20 ENCOUNTER — Inpatient Hospital Stay (HOSPITAL_COMMUNITY): Payer: BC Managed Care – PPO

## 2013-08-20 LAB — GLUCOSE, CAPILLARY
Glucose-Capillary: 102 mg/dL — ABNORMAL HIGH (ref 70–99)
Glucose-Capillary: 119 mg/dL — ABNORMAL HIGH (ref 70–99)
Glucose-Capillary: 87 mg/dL (ref 70–99)
Glucose-Capillary: 89 mg/dL (ref 70–99)
Glucose-Capillary: 94 mg/dL (ref 70–99)

## 2013-08-20 LAB — BASIC METABOLIC PANEL
CO2: 21 mEq/L (ref 19–32)
Chloride: 107 mEq/L (ref 96–112)
GFR calc Af Amer: 27 mL/min — ABNORMAL LOW (ref >90)
Glucose, Bld: 94 mg/dL (ref 70–99)
Potassium: 3.9 mEq/L (ref 3.5–5.1)
Sodium: 139 mEq/L (ref 135–145)

## 2013-08-20 LAB — CBC
Hemoglobin: 8.6 g/dL — ABNORMAL LOW (ref 13.0–17.0)
MCHC: 33.1 g/dL (ref 30.0–36.0)
Platelets: 58 10*3/uL — ABNORMAL LOW (ref 150–400)
RBC: 2.84 MIL/uL — ABNORMAL LOW (ref 4.22–5.81)
WBC: 19.6 10*3/uL — ABNORMAL HIGH (ref 4.0–10.5)

## 2013-08-20 NOTE — Progress Notes (Signed)
Progress Note for [facility]  Subjective: No acute events.  Complaints of hand pain.  Objective: Vital signs in last 24 hours: Temp:  [97.8 F (36.6 C)-99 F (37.2 C)] 98.5 F (36.9 C) (11/16 0400) Pulse Rate:  [33-144] 87 (11/16 0700) Resp:  [11-22] 20 (11/16 0700) BP: (99-124)/(69-95) 124/72 mmHg (11/16 0700) SpO2:  [100 %] 100 % (11/16 0700) Last BM Date: 08/19/13  Intake/Output from previous day: 11/15 0701 - 11/16 0700 In: 1390 [P.O.:1110; I.V.:230; IV Piggyback:50] Out: -  Intake/Output this shift:    General appearance: alert and no distress GI: soft, non-tender; bowel sounds normal; no masses,  no organomegaly  Lab Results:  Recent Labs  08/18/13 1600 08/19/13 0446 08/20/13 0500  WBC 7.4 9.6 19.6*  HGB 9.1* 10.2* 8.6*  HCT 26.5* 29.6* 26.0*  PLT 89* 77* 58*   BMET  Recent Labs  08/19/13 0446 08/20/13 0500  NA 141 139  K 3.7 3.9  CL 107 107  CO2 21 21  GLUCOSE 100* 94  BUN 88* 74*  CREATININE 2.89* 2.89*  CALCIUM 7.5* 7.2*   LFT No results found for this basename: PROT, ALBUMIN, AST, ALT, ALKPHOS, BILITOT, BILIDIR, IBILI,  in the last 72 hours PT/INR No results found for this basename: LABPROT, INR,  in the last 72 hours Hepatitis Panel No results found for this basename: HEPBSAG, HCVAB, HEPAIGM, HEPBIGM,  in the last 72 hours C-Diff No results found for this basename: CDIFFTOX,  in the last 72 hours Fecal Lactopherrin No results found for this basename: FECLLACTOFRN,  in the last 72 hours  Studies/Results: No results found.  Medications:  Scheduled: . antiseptic oral rinse  15 mL Mouth Rinse BID  . feeding supplement (ENSURE COMPLETE)  237 mL Oral BID BM  . meropenem (MERREM) IV  500 mg Intravenous Q12H  . metoprolol tartrate  25 mg Oral TID  . pantoprazole (PROTONIX) IV  40 mg Intravenous Q24H   Continuous:   Assessment/Plan: 1) Anemia.  2) Heme positive stools.  3) Dry gangrene in fingers and toes.  4) Tachycardia.    HGB  has dropped after blood transfusion, but no overt signs of GI bleeding.  Stool is liquid brown/green in the stool bag.  Plan:  1) Continue with supportive care. 2) [facility] to resume care tomorrow.  LOS: 16 days   Mikhael Hendriks D 08/20/2013, 7:29 AM

## 2013-08-20 NOTE — Progress Notes (Signed)
Report given to RN on 4 [address] room 27. Patient stable. No  Complaints at this time. Vitals stable.

## 2013-08-20 NOTE — Progress Notes (Addendum)
[facility] PROGRESS NOTE  Timothy Kline ZOX:[id number] DOB: 23-Oct-1960 DOA: 08/04/2013 PCP: No primary provider on file. Brief Narrative: Pt is a 52 year old male seen initially at [facility] 10/28 with AMS/Sepsis in setting of cocaine /oxycodone use. He was admitted with Respiratory Failure (ARDS)/Sepsis and developed acute renal failure, thrombocytopenia, a-fib Flutter with RVR and was transferred to [facility] service here at [facility] on 10/31 for HD and further care.  SIGNIFICANT EVENTS / STUDIES:  10/28 Admission to [facility]  10/29 Metabolic Acidosis, pH=6.9  10/31 Transferred to [facility] for HD.  10/31 Cardioversion for Afib/Flutter rate 170's ( Unsuccessful). Amiodarone initiated for Afib/Flutter RVR  10/31 Renal consult: CRRT initiated  10/31 Heme consult: TCP likely due to DIC. Doubt TTP/HUS  11/01 Echocardiogram: LVEF 20-25%. Diffuse HK  11/01 ID consult  11/01 Skin biopsy: thrombotic vasculopathic reaction  11/2 tolerated HD, off pressors. Platelet transfusion for thrombocytopenia  11/3 CXR with new pulmonary opacities/nodules. Concern for septic emboli.  11/03 CT chest: Multilobar bilateral areas of pulmonary parenchymal nodular consolidation with internal necrosis/ cavitation and suggestion of surrounding hazy opacity. The appearance is most typical for septic emboli  11/03 CT head: NAD  11/03 BCx from [place] 2/2 + for FUSOBACTERIUM NECROPHORUM.  11/04 CT neck (noncontrasted): No evidence of jugular vein thrombosis, abscesses, inflammation.  11/05 Jugular vein [place]: No thrombus noted  11/04 Converted to NSR from Afib/Aflutter  11/05 Dexmedetomidine initiated in anticipation of extubation soon.  11/06 C Diff negative, Tracheal aspirate few yeast  11/06 Continued fevers, restart vanc for possible MRSA PNA  11/07 Sinus Tract R antecubital arm at site of previous punch bx discovered  11/08 MRI R forearm/elbow/humerus: inadequate study due to inability to cooperate  11/09  Debridement and lavage of RUE wound (Coley). No joint space infxn  11/09 Hypotension > norepi. Acute blood loss anemia (probably post op) > 2 units RBCs. Weaned off norepi after RBCs  11/09 Vasc surg consult for digital gangrene. 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  -Patient's blood cultures from [place] grew Fusobacterium Necrophorum -he Was placed on antibiotics with cefepime and vancomycin (had been on Rocephin/Levaquin/Vanc at [place]) -ID was consulted and has been following patient for antibiotic recommendations -Per ID antibiotics deescalated to meropenem>> will continue, blood cultures from 10/31 so far negative -He also required vasopressors and as he improved clinically and blood pressure stabilized this were discontinued -he remains hemodynamically stable today off pressors, but WBC up today, afebrile>> ID to follow for further recs Multifocal cavitary pneumonia /Acute respiratory failure/ARDS -Patient was on the vent through11/10 and he was extubated, respiratory status remained stable and followup today - on antibiotics with WBC up today as discussed above, follow for further ID recs Thrombocytopenia with coagulopathy -secondary to Sepsis and DIC -As treated with antibiotics as discussed above, hematology was consulted and followed -Continue to monitor cbc, follow Anemia/Heme + stool -Per GI his anemia is multifactorial and endoscopic evaluation would not change his clinical outcome at this time. -hgb down this am to 8.6 but no gross bleeding -continue to monitor hgb and transfuse as appropriate  -GI  following Dry gangrene in R.fingers and  Bil toes. -per vascular no evidence of ascending cellulitis; no immediate surgical intervention needed -Dr. Myra Gianotti would like to time any possible foot surgeries with any hand surgeries to avoid additional anesthetic risks a-fib Flutter with RVR  -S/P10/31 Cardioversion for Afib/Flutter rate 170's ( Unsuccessful).  -also was treated with Amiodarone  for Afib/Flutter RVR no dc'ed -metoprolol dose increased to TID, with better rate control>> appreciate cards assistance Cardiomyopathy-Echocardiogram: LVEF 20-25%. -With increased peripheral edema noted, patient was followed an volume managed with dialysis per renal through 11/9 -Not on diuretics as this has been limited by Hypotension(now resolved) and poor renal function-improving but Cr still elevated>>see as discussed below Acute renal failure  -CRRT started 10/31 transitioned to intermittent dialysis Last dialysis 11/9. 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  Treated with meropenem and Vancomycin,Vanc DC'd 11/13, continue meropenem Rash/Thrombotic vasculopathic reaction -s/p Skin biopsy per derm on 11/1; thrombotic vasculopathic reaction  Diarrhea -C Diff negative11/06  - flexiseal in place, follow  Hypokalemia  Resolved, follow cocaine /oxycodone use  Hepatitis C  Lemierre syndrome Code Status: partial Family Communication: none at bedside  Disposition Plan: transfer to tele   Consultants:  GI  Vascular  ID  Renal-s/o 11/12  Heme/onc  Dermatology  cardiololgy - eval and recs pending  Procedures: LINES / TUBES:  R IJ CVL 10/28 >>>   ETT 10/29 >> 11/10  10/31 Cardioversion for Afib/Flutter rate 170's ( Unsuccessful) R femoral HD Cath 11/01 >>>11/12 11/01  Echocardiogram: LVEF 20-25%. Diffuse HK 11/01 Skin biopsy: thrombotic vasculopathic reaction  11/09 Debridement and lavage of RUE wound (Coley). No joint space infxn  11/10 Extubated, tolerated well  CULTURES:  [facility] >> + Fusobacterium Necrophorum  BCx2 10/31>>> Negative  Sputum 10/31>> Few yeast  Tracheal Asp. 11/05>> Rare WBC, no organisms  C Diff 11/5 >> Negative  RUE wound 11/7 >> Negative    Antibiotics: Rocephin: 10/28?>>>10/31  Levaquin: 10/28>>>10/31  Vancomycin 10/28>>> 11/3  Cefepime 10/31>>> 11/3  Flagyl 11/2 >> 11/3  Meropenem 11/3 >>  Vancomycin 11/6 >> 11/13   HPI/Subjective:  denies any CP or sob. denies gross bleeding.  Objective: Filed Vitals:   08/20/13 0800  BP: 121/71  Pulse: 82  Temp:   Resp: 14    Intake/Output Summary (Last 24 hours) at 08/20/13 0904 Last data filed at 08/20/13 0800  Gross per 24 hour  Intake   1368 ml  Output   2500 ml  Net  -1132 ml   Filed Weights   08/12/13 1454 08/13/13 0500 08/15/13 0454  Weight: 94.9 kg (209 lb 3.5 oz) 93.5 kg (206 lb 2.1 oz) 92.5 kg (203 lb 14.8 oz)   Exam:  General: alert & oriented x 3 In NAD Cardiovascular: regular, tachy, nl S1 s2 Respiratory: Decreased air entry bil, no cracles or wheezes Abdomen: soft +BS NT/ND, no masses palpable Extremities: +3 edema, dry gangrene of toes bilat. And fingers L>R dresssing over RUE wound clean and dry     Data Reviewed: Basic Metabolic Panel:  Recent Labs Lab 08/14/13 0500 08/15/13 0500 08/16/13 0410 08/17/13 0500 08/19/13 0446 08/20/13 0500  NA 149* 143 142 143 141 139  K 3.7 3.2* 3.5 3.5 3.7 3.9  CL 112 105 105 108 107 107  CO2 22 21 21 22 21 21   GLUCOSE 130* 106*  113* 101* 100* 94  BUN 161* 148* 124* 109* 88* 74*  CREATININE 3.54* 3.41* 3.26* 3.30* 2.89* 2.89*  CALCIUM 7.0* 6.7* 6.7* 6.9* 7.5* 7.2*  MG  --   --   --   --  1.7  --   PHOS 10.8*  --   --   --  7.3*  --    Liver Function Tests:  Recent Labs Lab  08/14/13 0500 08/15/13 0500  AST  --  37  ALT  --  26  ALKPHOS  --  71  BILITOT  --  1.1  PROT  --  5.4*  ALBUMIN 1.3* 1.4*   No results found for this basename: LIPASE, AMYLASE,  in the last 168 hours No results found for this basename: AMMONIA,  in the last 168 hours CBC:  Recent Labs Lab 08/15/13 0500  08/17/13 0500 08/18/13 0500 08/18/13 1600 08/19/13 0446 08/20/13 0500  WBC 7.0  < > 3.8* 5.4 7.4 9.6 19.6*  NEUTROABS 15.6*  --  2.2 3.5  --   --   --   HGB 6.8*  < > 7.0* 6.6* 9.1* 10.2* 8.6*  HCT 20.3*  < > 20.9* 20.1* 26.5* 29.6* 26.0*  MCV 89.8  < > 90.9 90.5 89.2 89.7 91.5  PLT 234  < > 120* 115* 89* 77* 58*  < > = values in this interval not displayed. Cardiac Enzymes: No results found for this basename: CKTOTAL, CKMB, CKMBINDEX, TROPONINI,  in the last 168 hours BNP (last 3 results) No results found for this basename: PROBNP,  in the last 8760 hours CBG:  Recent Labs Lab 08/19/13 0001 08/19/13 1121 08/19/13 1821 08/19/13 2355 08/20/13 0620  GLUCAP 92 110* 91 94 89    Recent Results (from the past 240 hour(s))  WOUND CULTURE     Status: None   Collection Time    08/11/13 10:38 AM      Result Value Range Status   Specimen Description WOUND ARM RIGHT   Final   Special Requests NONE   Final   Gram Stain     Final   Value: NO WBC SEEN     NO SQUAMOUS EPITHELIAL CELLS SEEN     NO ORGANISMS SEEN     Performed at [facility]   Culture     Final   Value: NO GROWTH 2 DAYS     Performed at [facility]   Report Status 08/13/2013 FINAL   Final  CLOSTRIDIUM DIFFICILE BY PCR     Status: None   Collection Time    08/16/13  5:26 PM      Result Value Range Status   C difficile by pcr NEGATIVE  NEGATIVE Final     Studies: No results found.  Scheduled Meds: . antiseptic oral rinse  15 mL Mouth Rinse BID  . feeding supplement (ENSURE COMPLETE)  237 mL Oral BID BM  . meropenem (MERREM) IV  500 mg Intravenous Q12H  . metoprolol tartrate   25 mg Oral TID  . 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 If 7PM-7AM, please contact night-coverage at [email or web address], password [facility] 08/20/2013, 9:04 AM  LOS: 16 days

## 2013-08-20 NOTE — Progress Notes (Signed)
SUBJECTIVE:  No complaints  OBJECTIVE:   Vitals:   Filed Vitals:   08/20/13 0700 08/20/13 0800 08/20/13 0900 08/20/13 1000  BP: 124/72 121/71 111/68 113/71  Pulse: 87 82 87 85  Temp:  98.1 F (36.7 C)    TempSrc:  Oral    Resp: 20 14 16 17   Height:      Weight:      SpO2: 100% 100% 100% 100%   I&O's:   Intake/Output Summary (Last 24 hours) at 08/20/13 1203 Last data filed at 08/20/13 1000  Gross per 24 hour  Intake   1086 ml  Output   2850 ml  Net  -1764 ml   TELEMETRY: Reviewed telemetry pt in NSR:     PHYSICAL EXAM General: Well developed, well nourished, in no acute distress Head: Eyes PERRLA, No xanthomas.   Normal cephalic and atramatic  Lungs:   Clear bilaterally to auscultation and percussion. Heart:   HRRR S1 S2 Pulses are 2+ & equal. . Abdomen: Bowel sounds are positive, abdomen soft and non-tender without masses Extremities:   Dry gangrene of fingers and feet Neuro: Alert and oriented X 3. Psych:  Good affect, responds appropriately   LABS: Basic Metabolic Panel:  Recent Labs  16/10/96 0446 08/20/13 0500  NA 141 139  K 3.7 3.9  CL 107 107  CO2 21 21  GLUCOSE 100* 94  BUN 88* 74*  CREATININE 2.89* 2.89*  CALCIUM 7.5* 7.2*  MG 1.7  --   PHOS 7.3*  --    Liver Function Tests: No results found for this basename: AST, ALT, ALKPHOS, BILITOT, PROT, ALBUMIN,  in the last 72 hours No results found for this basename: LIPASE, AMYLASE,  in the last 72 hours CBC:  Recent Labs  08/18/13 0500  08/19/13 0446 08/20/13 0500  WBC 5.4  < > 9.6 19.6*  NEUTROABS 3.5  --   --   --   HGB 6.6*  < > 10.2* 8.6*  HCT 20.1*  < > 29.6* 26.0*  MCV 90.5  < > 89.7 91.5  PLT 115*  < > 77* 58*  < > = values in this interval not displayed. Cardiac Enzymes: No results found for this basename: CKTOTAL, CKMB, CKMBINDEX, TROPONINI,  in the last 72 hours BNP: No components found with this basename: POCBNP,  D-Dimer: No results found for this basename: DDIMER,  in  the last 72 hours Hemoglobin A1C: No results found for this basename: HGBA1C,  in the last 72 hours Fasting Lipid Panel: No results found for this basename: CHOL, HDL, LDLCALC, TRIG, CHOLHDL, LDLDIRECT,  in the last 72 hours Thyroid Function Tests: No results found for this basename: TSH, T4TOTAL, FREET3, T3FREE, THYROIDAB,  in the last 72 hours Anemia Panel:  Recent Labs  08/18/13 0500  RETICCTPCT 1.8   Coag Panel:   Lab Results  Component Value Date   INR 1.19 08/14/2013   INR 1.36 08/08/2013   INR 1.55* 08/06/2013    RADIOLOGY: Ct Head Wo Contrast  08/07/2013   CLINICAL DATA:  Subsequent admission, patient difficulty breathing  EXAM: CT HEAD WITHOUT CONTRAST  TECHNIQUE: Contiguous axial images were obtained from the base of the skull through the vertex without intravenous contrast.  COMPARISON:  CT HEAD W/O CM dated 08/02/2013  FINDINGS: Patient intubated. No acute intracranial hemorrhage. No focal mass lesion. No CT evidence of acute infarction. No midline shift or mass effect. No hydrocephalus. Basilar cisterns are patent. Small fluid in the left mastoid air cells.  Paranasal sinuses are clear. Frontal sinuses clear.  IMPRESSION: 1. No acute intracranial findings. No change from prior.  2. Small of fluid in the left mastoid air cells is new.   Electronically Signed   By: Genevive Bi M.D.   On: 08/07/2013 16:06   Ct Soft Tissue Neck Wo Contrast  08/08/2013   CLINICAL DATA:  Sepsis. Acute renal failure.  Rule out neck abscess.  EXAM: CT NECK WITHOUT CONTRAST  TECHNIQUE: Multidetector CT imaging of the neck was performed following the standard protocol without intravenous contrast.  COMPARISON:  None.  FINDINGS: Lack of intravenous contrast to the limitation of this examination.  Right jugular central venous catheter is noted extending into the right innominate vein with tip not visualized. The right jugular vein does not appear distended or ill-defined however the study cannot exclude  venous thrombosis without intravenous contrast. Ultrasound of the neck may be helpful to evaluate for jugular vein thrombosis. Superior mediastinum does not show any mass or fluid collection.  Negative for fluid collection or abscess in the neck. No mass lesion is identified. No edema in the neck.  The patient is intubated. NG tube is in place.  Mild cervical degenerative changes. No acute bony change in the cervical spine. Extensive dental infection is noted with multiple caries and bony changes of chronic dental infection.  IMPRESSION: Right Jugular central venous catheter. No definite evidence of jugular venous thrombosis however this study is limited to evaluate for venous thrombosis without intravenous contrast. Consider followup neck ultrasound.  Negative for mass, edema, or abscess in the neck.   Electronically Signed   By: Marlan Palau M.D.   On: 08/08/2013 15:29   Ct Chest Wo Contrast  08/07/2013   CLINICAL DATA:  Sepsis, shortness of breath, pulmonary masses on prior exam. History of hepatitis-C and acute respiratory failure.  EXAM: CT CHEST WITHOUT CONTRAST  TECHNIQUE: Multidetector CT imaging of the chest was performed following the standard protocol without IV contrast.  COMPARISON:  08/07/2013 chest radiograph. No prior chest CT for comparison.  FINDINGS: Streak artifact from the patient's arms is present. Endotracheal tube is appropriately positioned. The nasogastric tube terminates below the level of the diaphragms. Right IJ approach central line tip terminates within the upper SVC. Mild cardiomegaly noted. No significant pericardial effusion. Trace pleural effusions are noted bilaterally. Incomplete imaging of the upper abdomen demonstrates a small amount of abdominal ascites. Great vessels are normal in caliber.  Dense the predominantly bilateral lower lobe confluent pulmonary parenchymal consolidation is noted with air bronchogram formation. There are patchy areas of multi focal bilateral  airspace consolidation with internal cavitation. Imaging is degraded by respiratory motion. Allowing for this, there is a subjective suggestion of hazy parenchymal opacification surrounding these necrotic/cavitary lesions. These are predominantly peripheral in location.  No acute osseous abnormality. Healing left inferior posterior rib fractures are noted, incompletely imaged.  IMPRESSION: Multilobar bilateral areas of pulmonary parenchymal nodular consolidation with internal necrosis/ cavitation and suggestion of surrounding hazy opacity. The appearance is most typical for septic emboli, focal necrotizing pneumonia including atypical agents such as fungal infection, or less likely bland infarct. Superimposed bilateral lower lobe consolidation may indicate pneumonia and or aspiration.   Electronically Signed   By: Christiana Pellant M.D.   On: 08/07/2013 15:54   Dg Chest Port 1 View  08/15/2013   CLINICAL DATA:  Evaluate pulmonary edema  EXAM: PORTABLE CHEST - 1 VIEW  COMPARISON:  08/14/2013; 08/10/2013; 08/07/2013; chest CT -08/07/2013  FINDINGS: Grossly unchanged  cardiac silhouette and mediastinal contours. Interval extubation and removal of enteric tube. Otherwise, stable positioning of remaining support apparatus. No pneumothorax. Grossly unchanged bilateral mid and lower lung heterogeneous airspace opacities 1 of which within the periphery of the right lower lung appears to demonstrate central cavitation. No new focal airspace opacities. Suspect a trace left-sided effusion. Unchanged bones, including sequela of old right clavicular fracture.  IMPRESSION: 1. Interval extubation and removal of enteric tube. Otherwise, stable position of support apparatus. No pneumothorax. 2. Grossly unchanged bilateral mid and lower lung predominant nodular airspace opacities again worrisome for septic emboli.   Electronically Signed   By: Simonne Come M.D.   On: 08/15/2013 07:50   Dg Chest Port 1 View  08/14/2013   CLINICAL  DATA:  Respiratory failure.  EXAM: PORTABLE CHEST - 1 VIEW  COMPARISON:  Chest radiograph 08/10/2013.  FINDINGS: ET tube terminates 2.2 cm superior to the carina. NG tube tip and side-port course inferior to the diaphragm, tip not included on this examination. Right IJ central venous catheter tip projects over the superior vena cava. Stable cardiac and mediastinal contours. No significant interval change in multiple bilateral nodular pulmonary opacities. No definite pleural effusion or pneumothorax.  IMPRESSION: ET tube terminates 2.2 cm superior to the carina  Chest radiograph with unchanged bilateral nodular pulmonary opacities, potentially representing multi focal septic emboli.   Electronically Signed   By: Annia Belt M.D.   On: 08/14/2013 07:34   Dg Chest Port 1 View  08/10/2013   CLINICAL DATA:  Respiratory failure.  EXAM: PORTABLE CHEST - 1 VIEW  COMPARISON:  Chest x-ray 08/07/2013.  FINDINGS: An endotracheal tube is in place with tip 2.8 cm above the carina. There is a right-sided internal jugular central venous catheter with tip terminating in the mid superior vena cava. A nasogastric tube is seen extending into the stomach, however, the tip of the nasogastric tube extends below the lower margin of the image. Lung volumes have improved slightly, now near normal. Multifocal nodular opacities are again noted throughout the mid to lower lungs bilaterally, some of which appear cavitary. No definite pleural effusions decreasing airspace consolidation throughout the left lower lobe. No evidence of pulmonary edema. Heart size is normal. Mediastinal contours are unremarkable.  IMPRESSION: 1. Support apparatus, as above. 2. Improving aeration in the left lower lobe, compatible with resolving airspace consolidation. 3. Persistent multifocal nodular opacities (some of which are cavitary) throughout the mid to lower lungs bilaterally, likely to reflect multifocal septic emboli based on comparison with recent chest  CT.   Electronically Signed   By: Trudie Reed M.D.   On: 08/10/2013 06:44   Dg Chest Port 1 View  08/07/2013   CLINICAL DATA:  Evaluate endotracheal tube placement.  EXAM: PORTABLE CHEST - 1 VIEW  COMPARISON:  CHEST x-ray 08/06/2013.  FINDINGS: An endotracheal tube is in place with tip 4.0 cm above the carina. There is a right-sided internal jugular central venous catheter with tip terminating in the mid superior vena cava. A nasogastric tube is seen extending into the stomach, however, the tip of the nasogastric tube extends below the lower margin of the image. Lung volumes are normal. Dense opacification at the base of the left hemithorax may reflect atelectasis and/or consolidation. Patchy nodular opacities are noted throughout the mida to lower lungs bilaterally. Possible trace left pleural effusion. No evidence of pulmonary edema. Heart size is borderline enlarged. Mediastinal contours are unremarkable.  IMPRESSION: 1. Support apparatus, as above. 2. Persistent atelectasis  and/or consolidation in the left lower lobe. 3. Multifocal nodular opacities throughout the mid to lower lungs bilaterally. These are new compared to recent prior examinations dating back to 08/02/2013, and are therefore favored to be of infectious or inflammatory etiology.   Electronically Signed   By: Trudie Reed M.D.   On: 08/07/2013 05:45   Dg Chest Port 1 View  08/06/2013   CLINICAL DATA:  Endotracheal tube placement.  EXAM: PORTABLE CHEST - 1 VIEW  COMPARISON:  Chest radiograph August 05, 2013  FINDINGS: Endotracheal tube tip projects 3.1 cm above the equina. Right internal jugular central venous catheter with distal tip projecting in proximal superior vena cava. Nasogastric tube appears looped in proximal stomach, distal tip not imaged at least past the proximal duodenum.  The cardiac silhouette appears at least mildly numb to moderately enlarged, even with consideration to this low inspiratory portable examination  with crowded vasculature markings. Interstitial prominence with decreasing patchy alveolar airspace opacities. Improved, small residual pleural effusions. No pneumothorax. Multiple EKG lines overlie the patient and may obscure subtle underlying pathology. Warming blanket overlies the patient.  IMPRESSION: Endotracheal tube tip now projects 3.1 cm above the carinal, no apparent change in remaining life support lines.  Stable cardiomegaly with interstitial and decreasing alveolar airspace opacities favoring confluent edema and/ or pneumonia with decreased, minimal residual pleural effusions.   Electronically Signed   By: Awilda Metro   On: 08/06/2013 04:55   Dg Chest Port 1 View  08/05/2013   CLINICAL DATA:  Evaluate endotracheal tube.  EXAM: PORTABLE CHEST - 1 VIEW  COMPARISON:  08/04/2013  FINDINGS: Endotracheal tube is 7.7 cm above the carina. There are cardiac pads on the chest. Right jugular central venous catheter in the SVC region. Increased densities in the right lower lung are suggestive for increased edema or pleural fluid. Heart size is stable. Nasogastric tube extends into the abdomen.  IMPRESSION: There are increased densities in the right lower chest. Findings may represent worsening edema and/or pleural fluid.  Support apparatuses as described.   Electronically Signed   By: Richarda Overlie M.D.   On: 08/05/2013 07:33   Dg Chest Port 1 View  08/04/2013   CLINICAL DATA:  Check endotracheal tube  EXAM: PORTABLE CHEST - 1 VIEW  COMPARISON:  08/04/2013 1536 hrs  FINDINGS: The cardiac shadow is stable. Patchy infiltrative changes are again identified bilaterally. A right-sided central venous line, nasogastric catheter and endotracheal tube are again seen and stable. The endotracheal tube again lies approximately 6.4 cm above the carinal. No other focal abnormality is seen.  IMPRESSION: Stable appearance of tubes and lines as described.  Patchy infiltrates bilaterally stable from the previous film    Electronically Signed   By: Alcide Clever M.D.   On: 08/04/2013 17:56   Dg Chest Port 1 View  08/04/2013   CLINICAL DATA:  Respiratory difficulty  EXAM: PORTABLE CHEST - 1 VIEW  COMPARISON:  08/04/2013  FINDINGS: Endotracheal tube, NG tube, right internal jugular central venous catheter are stable. The endotracheal tube remains 6.4 cm from the carinal. Extensive patchy bilateral airspace disease is not significantly changed. No pneumothorax.  IMPRESSION: Stable bilateral airspace disease.   Electronically Signed   By: Maryclare Bean M.D.   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  -per vascular no evidence of ascending cellulitis; no immediate surgical intervention needed  -Dr. Myra Gianotti would like to time any possible foot surgeries with any hand surgeries to avoid additional anesthetic risks  A-fib Flutter with RVR - in and out of afib yesterday.  Now in NSR -S/P10/31 Cardioversion for Afib/Flutter rate 170's ( Unsuccessful).  -also was treated with Amiodarone for Afib/Flutter RVR transiently  - metoprolol increased to 25mg  TID yesterday with no further afib on monitor this am. Cardiomyopathy-Echocardiogram: LVEF 20-25%.  -With increased peripheral edema noted, patient was followed an volume managed with dialysis per renal through 11/9  -Not on diuretics as  this has been limited by Hypotension(now resolved) and poor renal function-improving but Cr still elevated>>see as discussed below  Acute renal failure  -CRRT started 10/31 transitioned to intermittent dialysis Last dialysis 11/9.  -Renal followed and signed off on 11/12  -Creatinine continued to gradually improve  Quintella Reichert, MD  08/20/2013  12:03 PM

## 2013-08-20 NOTE — Progress Notes (Signed)
Pt c/o pain on his arms, Oxy IR given as ordered with relieve, pt denies any CP at this time.

## 2013-08-20 NOTE — Progress Notes (Signed)
Pt transferred from 16M, pt a/o, no c/o pain, pt stable, will continue to monitor

## 2013-08-21 LAB — BASIC METABOLIC PANEL
BUN: 70 mg/dL — ABNORMAL HIGH (ref 6–23)
CO2: 23 mEq/L (ref 19–32)
Calcium: 7.2 mg/dL — ABNORMAL LOW (ref 8.4–10.5)
Chloride: 107 mEq/L (ref 96–112)
Creatinine, Ser: 2.8 mg/dL — ABNORMAL HIGH (ref 0.50–1.35)
GFR calc non Af Amer: 24 mL/min — ABNORMAL LOW (ref >90)

## 2013-08-21 LAB — GLUCOSE, CAPILLARY
Glucose-Capillary: 85 mg/dL (ref 70–99)
Glucose-Capillary: 110 mg/dL — ABNORMAL HIGH (ref 70–99)
Glucose-Capillary: 94 mg/dL (ref 70–99)
Glucose-Capillary: 124 mg/dL — ABNORMAL HIGH (ref 70–99)

## 2013-08-21 LAB — HEPATITIS C GENOTYPE

## 2013-08-21 LAB — CBC
Hemoglobin: 8.5 g/dL — ABNORMAL LOW (ref 13.0–17.0)
MCHC: 32.9 g/dL (ref 30.0–36.0)
Platelets: 49 10*3/uL — ABNORMAL LOW (ref 150–400)
RDW: 17.3 % — ABNORMAL HIGH (ref 11.5–15.5)
WBC: 19.7 10*3/uL — ABNORMAL HIGH (ref 4.0–10.5)

## 2013-08-21 MED ORDER — ZOLPIDEM TARTRATE 5 MG PO TABS: 5.0000 mg | ORAL_TABLET | Freq: Once | ORAL | Status: DC

## 2013-08-21 MED ORDER — DIPHENHYDRAMINE HCL 25 MG PO CAPS
25.0000 mg | ORAL_CAPSULE | Freq: Once | ORAL | Status: AC
  Administered 2013-08-21: 02:00:00 25 mg via ORAL
  Filled 2013-08-21: qty 1

## 2013-08-21 NOTE — Progress Notes (Signed)
Speech Language Pathology Treatment: Dysphagia  Patient Details Name: Nymir Ringler MRN: [id number] DOB: [birth year] Today's Date: 08/21/2013 Time: 4098-1191 SLP Time Calculation (min): 45 min  Assessment / Plan / Recommendation Clinical Impression  Pt. Is requiring a downgrade of his diet due to ongoing, active bleeding from the labial commisures (bilateral corners of mouth) when attempting to eat.  Just opening his mouth initiates oozing of bright red blood.  Dried blood has collected at both corners of mouth and across bottom lip.  CNA reports bright red blood covers spoon after each bite.  Blood also coats the tongue.  Please consider dental consult.  Also a nutrition consult would be beneficial to assist with needed supplements to boost healing.  ? Etiology of oozing/bleeding sores of the labial commissures.  Likely s/p prolonged oral intubation.   HPI HPI: 52 year old male presented to [facility] 10/28 with AMS/Sepsis in setting of cocaine /oxycodone use. Pt admitted with Respiratory Failure (ARDS)/Sepsis has developed acute renal failure, thrombocytopenia, a-fib Flutter with RVR and was transferred to [facility] on 10/31 for HD and further care. CCM asked to admit. Pt intubated 11/1 and extubated 11/10.   Pertinent Vitals Afebrile; Last CXR 11/10 nodular airspace opacities worrisome for septic emboli.  SLP Plan  Consult other service (comment) ( Dental consult; Nutrition consult)    Recommendations Diet recommendations: Dysphagia 1 (puree);Thin liquid Liquids provided via: Straw Medication Administration: Whole meds with puree Supervision: Staff to assist with self feeding (Hads are bandaged (unable to feed self).) Compensations: Slow rate;Small sips/bites Postural Changes and/or Swallow Maneuvers: Seated upright 90 degrees              Oral Care Recommendations: Oral care Q4 per protocol;Staff/trained caregiver to provide oral care Follow up Recommendations: Skilled Nursing  facility Plan: Consult other service (comment) ( Dental consult; Nutrition consult)    GO     Maryjo Rochester T 08/21/2013, 12:39 PM

## 2013-08-21 NOTE — Progress Notes (Signed)
Pt a/o, c/o pain PRN meds given as ordered, dressing changes done, pt oob with therapy to chair, tolerated well, vss, will continue to monitor

## 2013-08-21 NOTE — Evaluation (Signed)
Occupational Therapy Evaluation Patient Details Name: Timothy Kline MRN: [id number] DOB: 11-Jan-1961 Today's Date: 08/21/2013 Time: 1350-1410 OT Time Calculation (min): 20 min  OT Assessment / Plan / Recommendation History of present illness 52 year old male presented to [facility] 10/28 with AMS/Sepsis in setting of cocaine /oxycodone use. Pt admitted with Respiratory Failure (ARDS)/Sepsis has developed acute renal failure, thrombocytopenia, a-fib Flutter with RVR and was transferred to [facility] on 10/31 for HD and further care. Pt with multisystem organ failure requiring dialysis; intubated until 11/09; had punch biopsy of skin lesion Rt antecubital space that later required I&D by ortho due to incr drainage; + DIC with septic pulmonary emboli and necrotizing of tips of all his digits (also due to use of vasopressors); cultures + for Fusobacterium Necrophorum   Clinical Impression   Pt admitted with above diagnoses. Pt has been critically ill and immobile since admission resulting in generalized weakness. As he is medically able to increase activity, he will undoubtedly have balance and mobility deficits related to his weakness, and also due to his ischemic digits x 4 extremities. Pt currently with functional limitations due to the deficits listed below (see OT Problem List). Pt will benefit from skilled OT to decrease caregiver assist and safety with mobility as it relates to BADL to allow discharge to the venue listed below.       OT Assessment  Patient needs continued OT Services    Follow Up Recommendations  SNF vs. CIR vs. Home health OT (surgery pending on hands and feet)   Barriers to Discharge Decreased caregiver support Son & Niece present and both unsure if family can provide 24/7 at home.  They continue to talke to family about it.  Equipment Recommendations  3 in 1 bedside comode    Frequency  Min 1X/week    Precautions / Restrictions Precautions Precautions: Fall Precaution  Comments: unable to use hands for functional tasks Restrictions Weight Bearing Restrictions:  (no WB orders, question if OK to weight bear through BLEs.  Note placed in sticky note by PT)   Pertinent Vitals/Pain 9/10, RN aware and medication provided, repositioned    ADL  ADL Comments: Patient is total assist for all BADL tasks and +2 with mobility related to BADL    OT Diagnosis: Generalized weakness;Acute pain  OT Problem List: Decreased strength;Decreased range of motion;Decreased activity tolerance;Impaired balance (sitting and/or standing);Decreased knowledge of use of DME or AE;Obesity;Pain;Increased edema OT Treatment Interventions: Therapeutic exercise;DME and/or AE instruction;Patient/family education;Therapeutic activities   OT Goals(Current goals can be found in the care plan section) Acute Rehab OT Goals Patient Stated Goal: to get stronger and get OOB OT Goal Formulation: With patient Time For Goal Achievement: 09/04/13 Potential to Achieve Goals: Fair  Visit Information  Last OT Received On: 08/21/13 Assistance Needed: +1 (for bed level activities) History of Present Illness: 52 year old male presented to [facility] 10/28 with AMS/Sepsis in setting of cocaine /oxycodone use. Pt admitted with Respiratory Failure (ARDS)/Sepsis has developed acute renal failure, thrombocytopenia, a-fib Flutter with RVR and was transferred to [facility] on 10/31 for HD and further care. Pt with multisystem organ failure requiring dialysis; intubated until 11/09; had punch biopsy of skin lesion Rt antecubital space that later required I&D by ortho due to incr drainage; + DIC with septic pulmonary emboli and necrotizing of tips of all his digits (also due to use of vasopressors); cultures + for Fusobacterium Necrophorum       Prior Functioning     Home  Living Family/patient expects to be discharged to:: Skilled nursing facility Additional Comments: Patient and family state that after patient  has surgery on hands and feet they will have a better idea if they can care for him at home Prior Function Level of Independence: Independent Communication Communication: No difficulties Dominant Hand: Right    Cognition  Cognition Arousal/Alertness: Awake/alert Behavior During Therapy: WFL for tasks assessed/performed Overall Cognitive Status: Within Functional Limits for tasks assessed    Extremity/Trunk Assessment Upper Extremity Assessment RUE Deficits / Details: bandaged from mid-humerus to wrist; AAROM shoulder flexion to 90; horiz adduction painful; elbow w/ bandage/wound yet with AAROM to ~110 flex and 170 ext; due to bandages and pain digits not assessed (PROM deferred due to black, ischemic digits) LUE Deficits / Details: Lt hand, wrist & forearm bandaged circumferentially and deferred due to ischemia; AAROM shoulder to 90, horiz adduction and abduction WFL, elbow to 120 Lower Extremity Assessment Lower Extremity Assessment: Defer to PT evaluation     Exercise Shoulder Exercises Shoulder Flexion: AAROM;Both;10 reps;Supine Shoulder ABduction: AAROM;Both;10 reps;Supine Elbow Flexion: AAROM;Both;10 reps;Supine   End of Session OT - End of Session Activity Tolerance: Patient limited by pain Patient left: in bed;with call bell/phone within reach;with nursing/sitter in room  GO     Timothy Kline 08/21/2013, 4:24 PM

## 2013-08-21 NOTE — Progress Notes (Signed)
[facility] PROGRESS NOTE  Timothy Kline YNW:[id number] DOB: 03/05/1961 DOA: 08/04/2013 PCP: No primary provider on file. Brief Narrative: Pt is a 52 year old male seen initially at [facility] 10/28 with AMS/Sepsis in setting of cocaine /oxycodone use. He was admitted with Respiratory Failure (ARDS)/Sepsis and developed acute renal failure, thrombocytopenia, a-fib Flutter with RVR and was transferred to [facility] service here at [facility] on 10/31 for HD and further care.  SIGNIFICANT EVENTS / STUDIES:  10/28 Admission to [facility]  10/29 Metabolic Acidosis, pH=6.9  10/31 Transferred to [facility] for HD.  10/31 Cardioversion for Afib/Flutter rate 170's ( Unsuccessful). Amiodarone initiated for Afib/Flutter RVR  10/31 Renal consult: CRRT initiated  10/31 Heme consult: TCP likely due to DIC. Doubt TTP/HUS  11/01 Echocardiogram: LVEF 20-25%. Diffuse HK  11/01 ID consult  11/01 Skin biopsy: thrombotic vasculopathic reaction  11/2 tolerated HD, off pressors. Platelet transfusion for thrombocytopenia  11/3 CXR with new pulmonary opacities/nodules. Concern for septic emboli.  11/03 CT chest: Multilobar bilateral areas of pulmonary parenchymal nodular consolidation with internal necrosis/ cavitation and suggestion of surrounding hazy opacity. The appearance is most typical for septic emboli  11/03 CT head: NAD  11/03 BCx from [place] 2/2 + for FUSOBACTERIUM NECROPHORUM.  11/04 CT neck (noncontrasted): No evidence of jugular vein thrombosis, abscesses, inflammation.  11/05 Jugular vein [place]: No thrombus noted  11/04 Converted to NSR from Afib/Aflutter  11/05 Dexmedetomidine initiated in anticipation of extubation soon.  11/06 C Diff negative, Tracheal aspirate few yeast  11/06 Continued fevers, restart vanc for possible MRSA PNA  11/07 Sinus Tract R antecubital arm at site of previous punch bx discovered  11/08 MRI R forearm/elbow/humerus: inadequate study due to inability to cooperate  11/09  Debridement and lavage of RUE wound (Coley). No joint space infxn  11/09 Hypotension > norepi. Acute blood loss anemia (probably post op) > 2 units RBCs. Weaned off norepi after RBCs  11/09 Vasc surg consult for digital gangrene. 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  -Patient's blood cultures from [place] grew Fusobacterium Necrophorum -he Was placed on antibiotics with cefepime and vancomycin (had been on Rocephin/Levaquin/Vanc at [place]) -ID was consulted and has been following patient for antibiotic recommendations -Per ID antibiotics deescalated to meropenem>> will continue, blood cultures from 10/31 so far negative -He also required vasopressors and as he improved clinically and blood pressure stabilized this were discontinued -he remains hemodynamically stable today off pressors, but WBC up today, afebrile>> ID to follow for further recs Multifocal cavitary pneumonia /Acute respiratory failure/ARDS -Patient was on the vent through11/10 and he was extubated, respiratory status remained stable and followup today - on antibiotics with WBC up today as discussed above, follow for further ID recs Thrombocytopenia with coagulopathy -secondary to Sepsis and DIC -As treated with antibiotics as discussed above, hematology was consulted and followed -Platelet count continues to trend down, no gross bleeding>> avoiding Lovenox, antiplatelet agents >>continue to follow . Anemia/Heme + stool -Per GI his anemia is multifactorial and endoscopic evaluation would not change his clinical outcome at this time. -hgb stable  8.5, and no  gross bleeding -continue to monitor hgb and transfuse  as appropriate  -GI following Dry gangrene in R.fingers and Bil toes. -per vascular no evidence of ascending cellulitis; no immediate surgical intervention needed -Dr. Myra Gianotti would like to time any possible foot surgeries with any hand surgeries to avoid additional anesthetic risks -Await vascular/Dr. Myra Gianotti to followup with recommendations on timing of surgery a-fib Flutter with RVR  -S/P10/31 Cardioversion for Afib/Flutter rate 170's ( Unsuccessful).  -also was treated with Amiodarone  for Afib/Flutter RVR no dc'ed -appreciate cards assistance, rate remained controlled on current metoprolol TID Cardiomyopathy-Echocardiogram: LVEF 20-25%. -With increased peripheral edema noted, patient was followed an volume managed with dialysis per renal through 11/9 -Not on diuretics as this has been limited by Hypotension(now resolved) and poor renal function-improving but Cr still elevated>>see as discussed below Acute renal failure  -CRRT started 10/31 transitioned to intermittent dialysis Last dialysis 11/9. 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  Treated with meropenem and Vancomycin,Vanc DC'd 11/13, continue meropenem Rash/Thrombotic vasculopathic reaction -s/p Skin biopsy per derm on 11/1; thrombotic vasculopathic reaction  Diarrhea -C Diff negative11/06  - flexiseal now with decreased stool output >>will DC Hypokalemia  Resolved, follow cocaine /oxycodone use  Hepatitis C  Lemierre syndrome Code Status: partial Family Communication: none at bedside  Disposition Plan: transfer to tele   Consultants:  GI  Vascular  ID  Renal-s/o 11/12  Heme/onc  Dermatology  cardiololgy - eval and recs  pending  Procedures: LINES / TUBES:  R IJ CVL 10/28 >>>   ETT 10/29 >> 11/10  10/31 Cardioversion for Afib/Flutter rate 170's ( Unsuccessful) R femoral HD Cath 11/01 >>>11/12 11/01 Echocardiogram: LVEF 20-25%. Diffuse HK 11/01 Skin biopsy: thrombotic vasculopathic reaction  11/09 Debridement and lavage of RUE wound (Coley). No joint space infxn  11/10 Extubated, tolerated well  CULTURES:  [facility] >> + Fusobacterium Necrophorum  BCx2 10/31>>> Negative  Sputum 10/31>> Few yeast  Tracheal Asp. 11/05>> Rare WBC, no organisms  C Diff 11/5 >> Negative  RUE wound 11/7 >> Negative    Antibiotics: Rocephin: 10/28?>>>10/31  Levaquin: 10/28>>>10/31  Vancomycin 10/28>>> 11/3  Cefepime 10/31>>> 11/3  Flagyl 11/2 >> 11/3  Meropenem 11/3 >>  Vancomycin 11/6 >> 11/13   HPI/Subjective:  denies any CP or sob. denies gross bleeding. Complaining of pain in his hands  Objective: Filed Vitals:   08/21/13 0458  BP: 130/74  Pulse: 89  Temp: 98 F (36.7 C)  Resp: 18    Intake/Output Summary (Last 24 hours) at 08/21/13 0956 Last data filed at 08/21/13 0802  Gross per 24 hour  Intake   1278 ml  Output   2750 ml  Net  -1472 ml   Filed Weights   08/15/13 0454 08/20/13 1200 08/21/13 0458  Weight: 92.5 kg (203 lb 14.8 oz) 90.4 kg (199 lb 4.7 oz) 89.9 kg (198 lb 3.1 oz)   Exam:  General: alert & oriented x 3 In NAD Cardiovascular: RRR nl S1 s2 Respiratory: Decreased air entry bil, no crackles or wheezes Abdomen: soft +BS NT/ND, no masses palpable Extremities: +2 edema, dry gangrene of toes bilat. And fingers L>R dresssing over RUE wound clean and dry     Data Reviewed: Basic Metabolic Panel:  Recent Labs Lab 08/16/13 0410 08/17/13 0500 08/19/13 0446 08/20/13 0500 08/21/13 0615  NA 142 143 141 139 140  K 3.5 3.5 3.7 3.9 4.3  CL 105 108 107 107 107  CO2 21 22 21 21 23   GLUCOSE 113* 101* 100* 94 95  BUN 124* 109* 88* 74* 70*  CREATININE 3.26* 3.30* 2.89*  2.89* 2.80*  CALCIUM 6.7* 6.9* 7.5* 7.2* 7.2*  MG  --   --  1.7  --   --   PHOS  --   --  7.3*  --   --    Liver Function Tests:  Recent Labs Lab 08/15/13 0500  AST 37  ALT 26  ALKPHOS 71  BILITOT 1.1  PROT 5.4*  ALBUMIN 1.4*   No results found for this basename: LIPASE, AMYLASE,  in the last 168 hours No results found for this basename: AMMONIA,  in the last 168 hours CBC:  Recent Labs Lab 08/15/13 0500  08/17/13 0500 08/18/13 0500 08/18/13 1600 08/19/13 0446 08/20/13 0500 08/21/13 0615  WBC 7.0  < > 3.8* 5.4 7.4 9.6 19.6* 19.7*  NEUTROABS 15.6*  --  2.2 3.5  --   --   --   --   HGB 6.8*  < > 7.0* 6.6* 9.1* 10.2* 8.6* 8.5*  HCT 20.3*  < > 20.9* 20.1* 26.5* 29.6* 26.0* 25.8*  MCV 89.8  < > 90.9 90.5 89.2 89.7 91.5 93.1  PLT 234  < > 120* 115* 89* 77* 58* 49*  < > = values in this interval not displayed. Cardiac Enzymes: No results found for this basename: CKTOTAL, CKMB, CKMBINDEX, TROPONINI,  in the last 168 hours BNP (last 3 results) No results found for this basename: PROBNP,  in the last 8760 hours CBG:  Recent Labs Lab 08/20/13 1624 08/20/13 1952 08/21/13 0014 08/21/13 0357 08/21/13 0748  GLUCAP 119* 102* 141* 87 85    Recent Results (from the past 240 hour(s))  WOUND CULTURE     Status: None   Collection Time    08/11/13 10:38 AM      Result Value Range Status   Specimen Description WOUND ARM RIGHT   Final   Special Requests NONE   Final   Gram Stain     Final   Value: NO WBC SEEN     NO SQUAMOUS EPITHELIAL CELLS SEEN     NO ORGANISMS SEEN     Performed at [facility]   Culture     Final   Value: NO GROWTH 2 DAYS     Performed at [facility]   Report Status 08/13/2013 FINAL   Final  CLOSTRIDIUM DIFFICILE BY PCR     Status: None   Collection Time    08/16/13  5:26 PM      Result Value Range Status   C difficile by pcr NEGATIVE  NEGATIVE Final     Studies: Dg Abd Portable 1v  08/20/2013   CLINICAL DATA:  Generalized  abdominal pain  EXAM: PORTABLE ABDOMEN - 1 VIEW  COMPARISON:  None.  FINDINGS: The bowel gas pattern is normal. No radio-opaque calculi or other significant radiographic abnormality are seen. Mild degradation of image quality due to patient motion. Right lung base opacity partly visualized. Presence or absence of air-fluid levels or free air is suboptimally evaluated on this supine projection.  IMPRESSION: Negative.   Electronically Signed   By: Christiana Pellant M.D.   On: 08/20/2013 15:27    Scheduled Meds: . antiseptic oral rinse  15 mL Mouth Rinse BID  . feeding supplement (ENSURE COMPLETE)  237 mL Oral BID BM  .  meropenem (MERREM) IV  500 mg Intravenous Q12H  . metoprolol tartrate  25 mg Oral TID  . 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 If 7PM-7AM, please contact night-coverage at [email or web address], password [facility] 08/21/2013, 9:56 AM  LOS: 17 days
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
## 2013-08-21 NOTE — Progress Notes (Signed)
[facility]     Day # 15 Meropenem   Subjective:  Left hand hurts  Antibiotics:  Anti-infectives   Start     Dose/Rate Route Frequency Ordered Stop   08/15/13 0800  vancomycin (VANCOCIN) IVPB 1000 mg/200 mL premix  Status:  Discontinued     1,000 mg 200 mL/hr over 60 Minutes Intravenous Every 24 hours 08/15/13 0744 08/17/13 0931   08/15/13 0800  meropenem (MERREM) 500 mg in sodium chloride 0.9 % 50 mL IVPB     500 mg 100 mL/hr over 30 Minutes Intravenous Every 12 hours 08/15/13 0749     08/12/13 1637  polymyxin B 500,000 Units, bacitracin 50,000 Units in sodium chloride irrigation 0.9 % 500 mL irrigation  Status:  Discontinued       As needed 08/12/13 1638 08/12/13 1703   08/12/13 1300  vancomycin (VANCOCIN) IVPB 1000 mg/200 mL premix     1,000 mg 200 mL/hr over 60 Minutes Intravenous  Once 08/12/13 1147 08/12/13 1441   08/11/13 1830  meropenem (MERREM) 1 g in sodium chloride 0.9 % 100 mL IVPB  Status:  Discontinued     1 g 200 mL/hr over 30 Minutes Intravenous Every 24 hours 08/11/13 1145 08/15/13 0749   08/11/13 1100  vancomycin (VANCOCIN) IVPB 750 mg/150 ml premix     750 mg 150 mL/hr over 60 Minutes Intravenous  Once 08/11/13 1026 08/11/13 1321   08/10/13 1745  vancomycin (VANCOCIN) IVPB 750 mg/150 ml premix     750 mg 150 mL/hr over 60 Minutes Intravenous  Once 08/10/13 1734 08/10/13 1920   08/07/13 1830  meropenem (MERREM) 500 mg in sodium chloride 0.9 % 50 mL IVPB  Status:  Discontinued     500 mg 100 mL/hr over 30 Minutes Intravenous Every 24 hours 08/07/13 1745 08/11/13 1145   08/06/13 2000  vancomycin (VANCOCIN) IVPB 750 mg/150 ml premix     750 mg 150 mL/hr over 60 Minutes Intravenous  Once 08/06/13 1056 08/07/13 0330   08/05/13 2200  ceFEPIme (MAXIPIME) 1 g in dextrose 5 % 50 mL IVPB  Status:  Discontinued     1 g 100 mL/hr over 30 Minutes Intravenous Every 24 hours 08/04/13 1815 08/07/13 1656   08/05/13 1200  metroNIDAZOLE  (FLAGYL) IVPB 500 mg  Status:  Discontinued     500 mg 100 mL/hr over 60 Minutes Intravenous Every 8 hours 08/05/13 1049 08/07/13 1656   08/04/13 1630  ceFEPIme (MAXIPIME) 2 g in dextrose 5 % 50 mL IVPB     2 g 100 mL/hr over 30 Minutes Intravenous  Once 08/04/13 1604 08/04/13 1747      Medications: Scheduled Meds: . antiseptic oral rinse  15 mL Mouth Rinse BID  . feeding supplement (ENSURE COMPLETE)  237 mL Oral BID BM  . meropenem (MERREM) IV  500 mg Intravenous Q12H  . metoprolol tartrate  25 mg Oral TID  . pantoprazole (PROTONIX) IV  40 mg Intravenous Q24H   Continuous Infusions:   PRN Meds:.sodium chloride, acetaminophen (TYLENOL) oral liquid 160 mg/5 mL, albuterol, fentaNYL, influenza vac split quadrivalent PF, loperamide, metoprolol, oxyCODONE, pneumococcal 23 valent vaccine   Objective: Weight change:   Intake/Output Summary (Last 24 hours) at 08/21/13 1342 Last data filed at 08/21/13 0802  Gross per 24 hour  Intake   1048 ml  Output   2750 ml  Net  -1702 ml   Blood pressure 135/74, pulse 87, temperature 98 F (36.7 C), temperature  source Oral, resp. rate 18, height 5\' 6"  (1.676 m), weight 198 lb 3.1 oz (89.9 kg), SpO2 98.00%. Temp:  [98 F (36.7 C)-98.1 F (36.7 C)] 98 F (36.7 C) (11/17 0458) Pulse Rate:  [87-92] 87 (11/17 1042) Resp:  [18] 18 (11/17 0458) BP: (106-135)/(69-74) 135/74 mmHg (11/17 1042) SpO2:  [98 %-99 %] 98 % (11/17 0458) Weight:  [198 lb 3.1 oz (89.9 kg)] 198 lb 3.1 oz (89.9 kg) (11/17 0458)  Physical Exam: General: poor dentition HEENT: anicteric sclera,, EOMI, right neck with central line,   CVS tachycardic rate, irr irr  no murmur rubs or gallops Chest:  rhonchi Abdomen: softnondistended, ? Tenderness to palpation of abdomennormal bowel sounds, Rectal pouch with copious brown stool  Extremities:  SEE PICTURES OF WOUND LEFT ARM wrapped, , CYANOTIC DRY GANGRENE CHAGNES DISTAL EXTREMITIES lEFT > RIGHT HAND AND bI lateral  toes            Neuro: nonfocal,  Lab Results:  Recent Labs  08/20/13 0500 08/21/13 0615  WBC 19.6* 19.7*  HGB 8.6* 8.5*  HCT 26.0* 25.8*  PLT 58* 49*    BMET  Recent Labs  08/20/13 0500 08/21/13 0615  NA 139 140  K 3.9 4.3  CL 107 107  CO2 21 23  GLUCOSE 94 95  BUN 74* 70*  CREATININE 2.89* 2.80*  CALCIUM 7.2* 7.2*    Micro Results: Recent Results (from the past 240 hour(s))  CLOSTRIDIUM DIFFICILE BY PCR     Status: None   Collection Time    08/16/13  5:26 PM      Result Value Range Status   C difficile by pcr NEGATIVE  NEGATIVE Final    Studies/Results: Dg Abd Portable 1v  08/20/2013   CLINICAL DATA:  Generalized abdominal pain  EXAM: PORTABLE ABDOMEN - 1 VIEW  COMPARISON:  None.  FINDINGS: The bowel gas pattern is normal. No radio-opaque calculi or other significant radiographic abnormality are seen. Mild degradation of image quality due to patient motion. Right lung base opacity partly visualized. Presence or absence of air-fluid levels or free air is suboptimally evaluated on this supine projection.  IMPRESSION: Negative.   Electronically Signed   By: Christiana Pellant M.D.   On: 08/20/2013 15:27      Assessment/Plan: Timothy Kline is a 52 y.o. male with   Hx of IVDU, Hepatitis C admitted with septic shock from FUSOBACTERIUM NECROPHORUM and with multifocal cavitary pneumonia, also with a fistulating PURULENT soft tissue infection +/- deep infection of the arm   #1 Fusobacterium Bacteremia and septic shock:CT neck S contrast without clot and Doppler without clot  --central line needs to be removed as it was placed when he was bacteremic  --check blood cultures post removal of Central line  --I will change to IV invanz  I would like him to have a month of IV antibiotics IF possible including the antibiotics he has already had and would then afterwards have him on  oral antibiotics (augmentin) until his CXR cavitary changes have resolved   #2  Multifocal PNA:  Due to Fusobacterium likely with Lemierres and embolization of organisms to lung,  +/- other anerobes, perhaps GNR, GPC. --narrowing to invanz see above   #3FISTULATING soft tissue +/- deeper abscess osteo/septic joint Right arm:  - greatly appreciate Orthopedics performing I and D wound  And it appears to be doing well   #3 Dry gangrene of digits secondary to pressors: VVS to coordiante with ortho hand and take to OR  next week  #4 Hep C; check genotype   #5 TTPEnia from DIC resolved  #6 ARF: stable  I will arrange HSFU in our clinic in the next 3 weeks.   Otherwise I will sign off for now please call back with further questions.    LOS: 17 days   Acey Lav 08/21/2013, 1:42 PM

## 2013-08-21 NOTE — Progress Notes (Signed)
Physical Therapy Treatment Patient Details Name: Timothy Kline MRN: [id number] DOB: 21-Jun-1961 Today's Date: 08/21/2013 Time: 6295-2841 PT Time Calculation (min): 60 min  PT Assessment / Plan / Recommendation  History of Present Illness     PT Comments   Pt received lying in bed and agreeable to supine BLE/UE exercises and getting OOB via lift equipment.  Unsure of WB restrictions (left sticky note for MD to clarify), therefore did not attempt standing or transfers during this session.   Tolerated exercises well, some discomfort in UEs during AAROM.  Left pt in recliner with call bell in reach and UE/LEs propped with pillows.    Follow Up Recommendations  CIR     Does the patient have the potential to tolerate intense rehabilitation     Barriers to Discharge        Equipment Recommendations  Other (comment) (TBD pending amputations)    Recommendations for Other Services OT consult  Frequency Min 3X/week   Progress towards PT Goals Progress towards PT goals: Progressing toward goals  Plan Current plan remains appropriate    Precautions / Restrictions Precautions Precautions: Fall Precaution Comments: femoral HD catheter d/c'd 11/12 Restrictions Weight Bearing Restrictions: No   Pertinent Vitals/Pain Pain in BUEs during exercises, did not state number, states "making me irritable."  RN aware.     Mobility  Bed Mobility Bed Mobility: Rolling Right;Rolling Left Rolling Right: 2: Max assist Rolling Left: 2: Max assist Details for Bed Mobility Assistance: Pt able to follow commands well during session, however requires assist for flexing LE and initiating movement of torso and to protect UE.   Transfers Transfer via Lift Equipment: Maximove Details for Transfer Assistance: Assisted pt via maximove bed to chair due to no clear WB orders written in chart.  Pt very motivated to get OOB.     Exercises General Exercises - Lower Extremity Ankle Circles/Pumps: AROM;Both;20  reps;Supine Quad Sets: Strengthening;Both;10 reps;Supine Short Arc Quad: Strengthening;Both;10 reps;Supine (AAROM on LLE) Heel Slides: AAROM;Both;Supine;10 reps Hip ABduction/ADduction: AAROM;Left;Supine;10 reps Straight Leg Raises: AAROM;Both;10 reps Shoulder Exercises Shoulder Flexion: AAROM;Both;10 reps Shoulder ABduction: AAROM;Both;10 reps Elbow Flexion: AAROM;Both;10 reps   PT Diagnosis:    PT Problem List:   PT Treatment Interventions:     PT Goals (current goals can now be found in the care plan section) Acute Rehab PT Goals Patient Stated Goal: to get stronger and get OOB PT Goal Formulation: With patient Time For Goal Achievement: 08/25/13 Potential to Achieve Goals: Good  Visit Information  Last PT Received On: 08/21/13 Assistance Needed: +2    Subjective Data  Subjective: "I want to get out of this bed" Patient Stated Goal: to get stronger and get OOB   Cognition  Cognition Arousal/Alertness: Awake/alert Behavior During Therapy: WFL for tasks assessed/performed Overall Cognitive Status: Within Functional Limits for tasks assessed    Balance     End of Session PT - End of Session Equipment Utilized During Treatment: Oxygen Activity Tolerance: Patient limited by pain;Patient limited by fatigue Patient left: in chair;with call bell/phone within reach Nurse Communication: Mobility status;Need for lift equipment   GP     Vista Deck 08/21/2013, 11:38 AM

## 2013-08-21 NOTE — Plan of Care (Signed)
Problem: SLP Dysphagia Goals Goal: Patient will utilize recommended strategies Patient will utilize recommended strategies during swallow to increase swallowing safety with  Outcome: Not Progressing Ongoing, active bleeding at labial commissures (bilaterally) when attempting to eat.

## 2013-08-22 ENCOUNTER — Inpatient Hospital Stay (HOSPITAL_COMMUNITY): Payer: BC Managed Care – PPO

## 2013-08-22 ENCOUNTER — Encounter (HOSPITAL_COMMUNITY): Payer: Self-pay | Admitting: Radiology

## 2013-08-22 LAB — GLUCOSE, CAPILLARY
Glucose-Capillary: 88 mg/dL (ref 70–99)
Glucose-Capillary: 91 mg/dL (ref 70–99)

## 2013-08-22 LAB — BASIC METABOLIC PANEL
CO2: 25 mEq/L (ref 19–32)
Calcium: 7.6 mg/dL — ABNORMAL LOW (ref 8.4–10.5)
Chloride: 105 mEq/L (ref 96–112)
GFR calc Af Amer: 27 mL/min — ABNORMAL LOW (ref >90)
Glucose, Bld: 101 mg/dL — ABNORMAL HIGH (ref 70–99)
Potassium: 4.3 mEq/L (ref 3.5–5.1)
Sodium: 138 mEq/L (ref 135–145)

## 2013-08-22 LAB — CBC
Hemoglobin: 8.6 g/dL — ABNORMAL LOW (ref 13.0–17.0)
MCH: 31 pg (ref 26.0–34.0)
Platelets: 63 10*3/uL — ABNORMAL LOW (ref 150–400)
RBC: 2.77 MIL/uL — ABNORMAL LOW (ref 4.22–5.81)
WBC: 20.2 10*3/uL — ABNORMAL HIGH (ref 4.0–10.5)

## 2013-08-22 MED ORDER — SODIUM CHLORIDE 0.9 % IV SOLN
1.0000 g | INTRAVENOUS | Status: DC
  Filled 2013-08-22: qty 1

## 2013-08-22 MED ORDER — ERTAPENEM SODIUM 1 G IJ SOLR: 1.0000 g | INTRAMUSCULAR | Status: DC

## 2013-08-22 MED ORDER — SODIUM CHLORIDE 0.9 % IJ SOLN
10.0000 mL | INTRAMUSCULAR | Status: DC | PRN
  Administered 2013-08-22: 20 mL
  Administered 2013-08-22: 14:00:00 30 mL
  Administered 2013-08-22: 08:00:00 20 mL
  Administered 2013-08-23 – 2013-08-24 (×6): 10 mL
  Administered 2013-08-25: 20 mL
  Administered 2013-08-25: 06:00:00 10 mL
  Administered 2013-08-28 – 2013-08-29 (×2): 20 mL
  Administered 2013-08-31 (×2): 10 mL
  Administered 2013-09-01 – 2013-09-02 (×5): 20 mL
  Administered 2013-09-03: 30 mL
  Administered 2013-09-03: 09:00:00 20 mL
  Administered 2013-09-03 – 2013-09-06 (×7): 10 mL

## 2013-08-22 MED ORDER — SODIUM CHLORIDE 0.9 % IV SOLN
1.0000 g | Freq: Every day | INTRAVENOUS | Status: AC
  Administered 2013-08-22 – 2013-09-07 (×17): 1 g via INTRAVENOUS
  Filled 2013-08-22 (×19): qty 1

## 2013-08-22 NOTE — Progress Notes (Signed)
[facility] PROGRESS NOTE  Timothy Kline ZOX:[id number] DOB: 04-Jan-1961 DOA: 08/04/2013 PCP: No primary provider on file. Brief Narrative: Pt is a 52 year old male seen initially at [facility] 10/28 with AMS/Sepsis in setting of cocaine /oxycodone use. He was admitted with Respiratory Failure (ARDS)/Sepsis and developed acute renal failure, thrombocytopenia, a-fib Flutter with RVR and was transferred to [facility] service here at [facility] on 10/31 for HD and further care.  SIGNIFICANT EVENTS / STUDIES:  10/28 Admission to [facility]  10/29 Metabolic Acidosis, pH=6.9  10/31 Transferred to [facility] for HD.  10/31 Cardioversion for Afib/Flutter rate 170's ( Unsuccessful). Amiodarone initiated for Afib/Flutter RVR  10/31 Renal consult: CRRT initiated  10/31 Heme consult: TCP likely due to DIC. Doubt TTP/HUS  11/01 Echocardiogram: LVEF 20-25%. Diffuse HK  11/01 ID consult  11/01 Skin biopsy: thrombotic vasculopathic reaction  11/2 tolerated HD, off pressors. Platelet transfusion for thrombocytopenia  11/3 CXR with new pulmonary opacities/nodules. Concern for septic emboli.  11/03 CT chest: Multilobar bilateral areas of pulmonary parenchymal nodular consolidation with internal necrosis/ cavitation and suggestion of surrounding hazy opacity. The appearance is most typical for septic emboli  11/03 CT head: NAD  11/03 BCx from [place] 2/2 + for FUSOBACTERIUM NECROPHORUM.  11/04 CT neck (noncontrasted): No evidence of jugular vein thrombosis, abscesses, inflammation.  11/05 Jugular vein [place]: No thrombus noted  11/04 Converted to NSR from Afib/Aflutter  11/05 Dexmedetomidine initiated in anticipation of extubation soon.  11/06 C Diff negative, Tracheal aspirate few yeast  11/06 Continued fevers, restart vanc for possible MRSA PNA  11/07 Sinus Tract R antecubital arm at site of previous punch bx discovered  11/08 MRI R forearm/elbow/humerus: inadequate study due to inability to cooperate  11/09  Debridement and lavage of RUE wound (Coley). No joint space infxn  11/09 Hypotension > norepi. Acute blood loss anemia (probably post op) > 2 units RBCs. Weaned off norepi after RBCs  11/09 Vasc surg consult for digital gangrene. 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  -Patient's blood cultures from [place] grew Fusobacterium Necrophorum -he Was placed on antibiotics with cefepime and vancomycin (had been on Rocephin/Levaquin/Vanc at [place]) -ID was consulted and has been following patient for antibiotic recommendations -Per ID antibiotics deescalated to meropenem>> will continue, blood cultures from 10/31 so far negative -He also required vasopressors and as he improved clinically and blood pressure stabilized this were discontinued -he remains hemodynamically stable today off pressors, WBC still  up today, afebrile>> ID recs as per 11/17 note>> will change to invanz, and CVL is to be removed as it was put in while pt was bacteremic>> IR to place new tunnelled line in am, and current CVL to be removed then. Multifocal cavitary pneumonia /Acute respiratory failure/ARDS -Patient was on the vent through11/10 and he was extubated, respiratory status remained stable and followup today - on antibiotics with WBC up today as discussed above,please see ID note of 11/17 for abx recs>>pt will need to be on iv abx to total 1mos and then on augmentin thereafter till caitary lesions clear Thrombocytopenia with coagulopathy -secondary to Sepsis and DIC -As treated with antibiotics as discussed above, hematology  was consulted and  followed -Platelet count beginning to trend up today, no gross bleeding>> avoiding Lovenox, antiplatelet agents >>continue to follow . Anemia/Heme + stool -Per GI his anemia is multifactorial and endoscopic evaluation would not change his clinical outcome at this time. 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 -per vascular no evidence of ascending cellulitis; no immediate surgical intervention needed -Dr. Myra Gianotti would like to time any possible foot surgeries with any hand surgeries to avoid additional anesthetic risks -Talked with Dr. Myra Gianotti today 11/18 and he states he is awaiting Dr Louanna Raw surgeons input on timing of surgery>> I called Dr Debby Bud office and he is to see pt and a make further recs on remmendations and timing for management of gangrenous fingers.  a-fib Flutter with RVR  -S/P10/31 Cardioversion for Afib/Flutter rate 170's ( Unsuccessful).  -also was treated with Amiodarone  for Afib/Flutter RVR no dc'ed -appreciate cards assistance, rate remained controlled on current metoprolol TID Cardiomyopathy-Echocardiogram: LVEF 20-25%. -With increased peripheral edema noted, patient was followed an volume managed with dialysis per renal through 11/9 -Not on diuretics as this has been limited by Hypotension(now resolved) and poor renal function-improving but Cr still elevated>>see as discussed below Acute renal failure  -CRRT started 10/31 transitioned to intermittent dialysis Last dialysis 11/9. 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  Treated with meropenem and Vancomycin,Vanc DC'd 11/13, continue meropenem Rash/Thrombotic vasculopathic reaction -s/p Skin biopsy per  derm on 11/1; thrombotic vasculopathic reaction  Diarrhea -C Diff negative11/06  - flexiseal now with decreased stool output >>will DC Hypokalemia  Resolved, follow cocaine /oxycodone use  Hepatitis C  Lemierre syndrome Code Status: partial Family Communication: none at bedside  Disposition Plan: transfer to tele   Consultants:  GI  Vascular  ID  Renal-s/o 11/12  Heme/onc  Dermatology  cardiololgy - eval and recs pending  Procedures: LINES / TUBES:  R IJ CVL 10/28 >>>   ETT 10/29 >> 11/10  10/31 Cardioversion for Afib/Flutter rate 170's ( Unsuccessful) R femoral HD Cath 11/01 >>>11/12 11/01 Echocardiogram: LVEF 20-25%. Diffuse HK 11/01 Skin biopsy: thrombotic vasculopathic reaction  11/09 Debridement and lavage of RUE wound (Coley). No joint space infxn  11/10 Extubated, tolerated well  CULTURES:  [facility] >> + Fusobacterium Necrophorum  BCx2 10/31>>> Negative  Sputum 10/31>> Few yeast  Tracheal Asp. 11/05>> Rare WBC, no organisms  C Diff 11/5 >> Negative  RUE wound 11/7 >> Negative    Antibiotics: Rocephin: 10/28?>>>10/31  Levaquin: 10/28>>>10/31  Vancomycin 10/28>>> 11/3  Cefepime 10/31>>> 11/3  Flagyl 11/2 >> 11/3  Meropenem 11/3 >>  Vancomycin 11/6 >> 11/13   HPI/Subjective: No new c/o,  denies any CP or sob. denies gross bleeding.   Objective: Filed Vitals:   08/22/13 1001  BP: 123/74  Pulse: 96  Temp: 98 F (36.7 C)  Resp: 18    Intake/Output Summary (Last 24 hours) at 08/22/13 1002 Last data filed at 08/22/13 0454  Gross per 24 hour  Intake    940 ml  Output   2550 ml  Net  -1610 ml   Filed Weights   08/20/13 1200 08/21/13 0458 08/22/13 0446  Weight: 90.4 kg (199 lb 4.7 oz) 89.9 kg (  198 lb 3.1 oz) 88 kg (194 lb 0.1 oz)   Exam:  General: alert & oriented x 3 In NAD Cardiovascular: RRR nl S1 s2 Respiratory: Decreased air entry bil, no crackles or wheezes Abdomen: soft +BS NT/ND, no masses palpable Extremities:  +2 edema, dry gangrene of toes bilat. And fingers L>R dresssing over RUE wound clean and dry     Data Reviewed: Basic Metabolic Panel:  Recent Labs Lab 08/17/13 0500 08/19/13 0446 08/20/13 0500 08/21/13 0615 08/22/13 0813  NA 143 141 139 140 138  K 3.5 3.7 3.9 4.3 4.3  CL 108 107 107 107 105  CO2 22 21 21 23 25   GLUCOSE 101* 100* 94 95 101*  BUN 109* 88* 74* 70* 63*  CREATININE 3.30* 2.89* 2.89* 2.80* 2.91*  CALCIUM 6.9* 7.5* 7.2* 7.2* 7.6*  MG  --  1.7  --   --   --   PHOS  --  7.3*  --   --   --    Liver Function Tests: No results found for this basename: AST, ALT, ALKPHOS, BILITOT, PROT, ALBUMIN,  in the last 168 hours No results found for this basename: LIPASE, AMYLASE,  in the last 168 hours No results found for this basename: AMMONIA,  in the last 168 hours CBC:  Recent Labs Lab 08/17/13 0500 08/18/13 0500 08/18/13 1600 08/19/13 0446 08/20/13 0500 08/21/13 0615 08/22/13 0813  WBC 3.8* 5.4 7.4 9.6 19.6* 19.7* 20.2*  NEUTROABS 2.2 3.5  --   --   --   --   --   HGB 7.0* 6.6* 9.1* 10.2* 8.6* 8.5* 8.6*  HCT 20.9* 20.1* 26.5* 29.6* 26.0* 25.8* 25.5*  MCV 90.9 90.5 89.2 89.7 91.5 93.1 92.1  PLT 120* 115* 89* 77* 58* 49* 63*   Cardiac Enzymes: No results found for this basename: CKTOTAL, CKMB, CKMBINDEX, TROPONINI,  in the last 168 hours BNP (last 3 results) No results found for this basename: PROBNP,  in the last 8760 hours CBG:  Recent Labs Lab 08/21/13 1619 08/21/13 2027 08/22/13 0020 08/22/13 0444 08/22/13 0708  GLUCAP 94 124* 91 88 90    Recent Results (from the past 240 hour(s))  CLOSTRIDIUM DIFFICILE BY PCR     Status: None   Collection Time    08/16/13  5:26 PM      Result Value Range Status   C difficile by pcr NEGATIVE  NEGATIVE Final     Studies: Dg Chest Port 1 View  08/22/2013   CLINICAL DATA:  Check the position of central line.  EXAM: PORTABLE CHEST - 1 VIEW  COMPARISON:  08/15/2013, 08/14/2013.  FINDINGS: There is a right-sided  jugular central venous catheter with the tip projecting over the SVC IJ confluence. Marland Kitchen Heart size and mediastinum are stable.  There are bilateral nodular airspace opacities primarily involving the lower lungs with some of these opacities demonstrating cavitation. The overall appearance is most consistent with septic emboli.  IMPRESSION: Right-sided jugular central venous catheter with the tip projecting over the SVC IJV confluence.  Bilateral nodular airspace opacities again noted in the lower lungs most consistent with septic emboli.   Electronically Signed   By: Elige Ko   On: 08/22/2013 09:19   Dg Abd Portable 1v  08/20/2013   CLINICAL DATA:  Generalized abdominal pain  EXAM: PORTABLE ABDOMEN - 1 VIEW  COMPARISON:  None.  FINDINGS: The bowel gas pattern is normal. No radio-opaque calculi or other significant radiographic abnormality are seen. Mild degradation of image  quality due to patient motion. Right lung base opacity partly visualized. Presence or absence of air-fluid levels or free air is suboptimally evaluated on this supine projection.  IMPRESSION: Negative.   Electronically Signed   By: Christiana Pellant M.D.   On: 08/20/2013 15:27    Scheduled Meds: . antiseptic oral rinse  15 mL Mouth Rinse BID  . feeding supplement (ENSURE COMPLETE)  237 mL Oral BID BM  . meropenem (MERREM) IV  500 mg Intravenous Q12H  . metoprolol tartrate  25 mg Oral TID  . 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 If 7PM-7AM, please contact night-coverage at [email or web address], password [facility] 08/22/2013, 10:02 AM  LOS: 18 days

## 2013-08-22 NOTE — Progress Notes (Signed)
Called for DBIV on pt transferred to 4E-27.  Arrived to find the right TL CVC without a central line dressing but with paper tape attached to pt's facial hair tenting and not occluding the central line. There is a Biopatch attached to the tape and not on the site.  The central line sutures are no longer attached to pt skin and the catheter is coiled.  IV fluids stopped, line flushed and blood was a drawn from site without incident.  Asked the primary nurse to get a stat portable chest xray to check tip placement. Advised that this central line needs to be replaced with a more permanent venous access as this line has been in since 08/02/13 which presents patient with an increased risk for infection. Pt's has bilateral arm edema and open wounds therefore PIV is not an option at this time.  Consuello Masse

## 2013-08-22 NOTE — Progress Notes (Signed)
NUTRITION FOLLOW UP  Intervention:   Ensure Complete PO BID, each supplement provides 350 kcal and 13 grams of protein. RD to continue to follow nutrition care plan.  Nutrition Dx:   Inadequate oral intake related to dysphagia as evidenced by 75% meal completion. Resolved.  New Nutrition Dx: Increased nutrient needs r/t wound healing AEB estimated needs.  Goal: Intake to meet >90% of estimated nutrition needs. Improving.  Monitor:   PO intake, labs, weight trend.  Assessment:   PMHx significant for substance abuse and Hep C. Admitted to [facility] on 10/28 with AMS/sepsis in setting of cocaine and oxycodone use. Pt admitted to [facility] on 10/31 with septic shock from Fusobacterium bacteremia and with multifocal cavitary pneumonia, also with a fistulating purulent soft tissue infection +/- deep infection of the arm.  GI following for anemia and heme positive stool. Pt getting blood transfusions as needed.  Continues with dry gangrene in R fingers and bilateral toes - per Vascular, no immediate surgical intervention needed. Pt now with wound VAC to R arm full thickness wound s/p debridement by surgery. Seen by WOC RN on 11/18.  Diet was advanced to dysphagia 2 with thin liquids on 11/14, but downgraded to dysphagia 1 with thins on 11/17. Pt required downgrade 2/2 ongoing active bleeding from bilateral corners of mouth when attempting to eat. Pt reports that he is eating well and drinking his Ensure as scheduled. Meal intake is documented at 100%.  Height: Ht Readings from Last 1 Encounters:  08/20/13 5\' 6"  (1.676 m)   Weight Status:   Wt Readings from Last 1 Encounters:  08/22/13 194 lb 0.1 oz (88 kg)  08/11/13  213 lb 13.5 oz (97 kg)  08/05/13  235 lb 0.2 oz (106.6 kg)   Re-estimated needs:  Kcal: 2200-2400 Protein: 120-140 gm Fluid: 2.2-2.4 L  Skin:  skin breakdown on feet, heels, arms, and hands Arm incision Wound VAC to arm  Diet Order: Dysphagia 1 with thin  liquids    Intake/Output Summary (Last 24 hours) at 08/22/13 1236 Last data filed at 08/22/13 1100  Gross per 24 hour  Intake   1620 ml  Output   3200 ml  Net  -1580 ml    Last BM: 11/17   Labs:   Recent Labs Lab 08/19/13 0446 08/20/13 0500 08/21/13 0615 08/22/13 0813  NA 141 139 140 138  K 3.7 3.9 4.3 4.3  CL 107 107 107 105  CO2 21 21 23 25   BUN 88* 74* 70* 63*  CREATININE 2.89* 2.89* 2.80* 2.91*  CALCIUM 7.5* 7.2* 7.2* 7.6*  MG 1.7  --   --   --   PHOS 7.3*  --   --   --   GLUCOSE 100* 94 95 101*    CBG (last 3)   Recent Labs  08/22/13 0444 08/22/13 0708 08/22/13 1129  GLUCAP 88 90 117*    Scheduled Meds: . antiseptic oral rinse  15 mL Mouth Rinse BID  . ertapenem  1 g Intravenous Daily  . feeding supplement (ENSURE COMPLETE)  237 mL Oral BID BM  . metoprolol tartrate  25 mg Oral TID  . pantoprazole (PROTONIX) IV  40 mg Intravenous Q24H    Continuous Infusions:    Timothy Motto MS, RD, LDN Pager: [phone] After-hours pager: [phone]
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
## 2013-08-22 NOTE — Progress Notes (Signed)
Spoke again with Dr. Suanne Marker regarding central line. Dr. Donna Bernard states she placed order for IR to place line which may occur tomorrow. When asked about central line that is currently in right IJ, Dr. Suanne Marker states to leave in until other line is placed.

## 2013-08-22 NOTE — Progress Notes (Signed)
Pt. continues to call out for pain medication and not being able to go to sleep. Pt. Repositioned in bed. Call light placed within reach. PRN pain medication administered as ordered. RN to notify MD of pts. Request for something to help him rest. RN will continue to monitor pt. For change in condition. Timothy Kline, Cheryll Dessert

## 2013-08-22 NOTE — Progress Notes (Signed)
Dressing on central line not intact and dated for 08/18/2013. Christina, RN from IV team stopped fluids that were infusing and states line looks out of place. STAT CXR ordered to verify placement. Christina, RN in room to draw blood and states she is not comfortable using central line. Dr. Suanne Marker made aware of central line appearance. Dr. Suanne Marker states she is aware that central line outdated and to try and start peripheral line. Patient has bilateral dressings from hand to elbow with 4+ pitting edema. Christina, RN states she tried to start peripheral line previously and was unsuccessful. Viyuoh states that if placement is correct, use central line.

## 2013-08-22 NOTE — Consult Note (Signed)
WOC follow-up: Refer to previous progress notes for wound assessment and measurements. Assisted bedside nurse with  Vac dressing application.  Applied Mepitel contact layer and one piece black foam to cont suction to right arm wound.  Pt tolerated without C/O pain.  Plan for bedside nurse to change Q Tues/Thurs/Sat. Cammie Mcgee MSN, RN, CWOCN, [place], CNS [phone]

## 2013-08-22 NOTE — Progress Notes (Signed)
Spoke with Dr. Suanne Marker regarding Chest xray results for central line tip placement verification. MD read results and aware that tip at [facility] IJ confluence. MD states central line to be pulled later today, however to go ahead and administer Invanz dose before discontinuing line. Dr. Suanne Marker reminded that IV therapy has no been successful with peripheral IV starts on patients due to edema in bilateral arms and wounds. Dr. Suanne Marker states she will reassess alternatives later. Trula Ore, RN from IV therapy made aware.

## 2013-08-22 NOTE — Consult Note (Addendum)
WOC wound follow up Wound type: Requested by hand surgeon to assess right arm wound for possible vac.  VVS following for assessment and plan of care to necrotic hands and feet.  Measurement: Right arm with full thickness wound after debridement by hand surgeon 3X3X.8cm, undermining to 1 cm from 12:00 o'clock to 3:00 o'clock. Wound bed: Beefy red, visible tendons. Drainage (amount, consistency, odor) Small amt yellow drainage, no odor. Periwound: intact skin surrounding. Dressing procedure/placement/frequency: Wound is appropriate for vac therapy.  Will order supplies and apply later today. Bedside nurse can change Q Tuesday/Thusday/Sat. Cammie Mcgee MSN, RN, CWOCN, [place], CNS [phone]

## 2013-08-23 ENCOUNTER — Inpatient Hospital Stay (HOSPITAL_COMMUNITY): Payer: BC Managed Care – PPO

## 2013-08-23 LAB — CBC
HCT: 26.3 % — ABNORMAL LOW (ref 39.0–52.0)
Hemoglobin: 8.6 g/dL — ABNORMAL LOW (ref 13.0–17.0)
MCH: 30.4 pg (ref 26.0–34.0)
MCHC: 32.7 g/dL (ref 30.0–36.0)
Platelets: 85 10*3/uL — ABNORMAL LOW (ref 150–400)

## 2013-08-23 LAB — GLUCOSE, CAPILLARY: Glucose-Capillary: 102 mg/dL — ABNORMAL HIGH (ref 70–99)

## 2013-08-23 LAB — BASIC METABOLIC PANEL
BUN: 62 mg/dL — ABNORMAL HIGH (ref 6–23)
Calcium: 7.8 mg/dL — ABNORMAL LOW (ref 8.4–10.5)
Creatinine, Ser: 2.74 mg/dL — ABNORMAL HIGH (ref 0.50–1.35)
GFR calc Af Amer: 29 mL/min — ABNORMAL LOW (ref >90)
GFR calc non Af Amer: 25 mL/min — ABNORMAL LOW (ref >90)

## 2013-08-23 MED ORDER — MIDAZOLAM HCL 2 MG/2ML IJ SOLN
INTRAMUSCULAR | Status: AC
  Filled 2013-08-23: qty 2

## 2013-08-23 MED ORDER — FENTANYL CITRATE 0.05 MG/ML IJ SOLN
INTRAMUSCULAR | Status: AC
  Filled 2013-08-23: qty 2

## 2013-08-23 MED ORDER — PANTOPRAZOLE SODIUM 40 MG PO TBEC
40.0000 mg | DELAYED_RELEASE_TABLET | Freq: Every day | ORAL | Status: DC
  Administered 2013-08-24 – 2013-09-07 (×14): 40 mg via ORAL
  Filled 2013-08-23 (×12): qty 1

## 2013-08-23 NOTE — Progress Notes (Signed)
Dr. Elvera Lennox made aware that patient back on unit with new central line access. Order received to discontinue old DBIV. IV therapy made aware that old right IJ DBIV can be removed. IV therapy to come and remove. Order also received to place patient back on previous diet of Dys 1 with 1800 fluid restriction.

## 2013-08-23 NOTE — ED Notes (Signed)
Tolerated well without sedation.

## 2013-08-23 NOTE — Progress Notes (Signed)
Physical Therapy Treatment Patient Details Name: Timothy Kline MRN: [id number] DOB: [birth year] Today's Date: 08/23/2013 Time: 4010-2725 PT Time Calculation (min): 27 min  PT Assessment / Plan / Recommendation  History of Present Illness 52 year old male presented to [facility] 10/28 with AMS/Sepsis in setting of cocaine /oxycodone use. Pt admitted with Respiratory Failure (ARDS)/Sepsis has developed acute renal failure, thrombocytopenia, a-fib Flutter with RVR and was transferred to [facility] on 10/31 for HD and further care. Pt with multisystem organ failure requiring dialysis; intubated until 11/09; had punch biopsy of skin lesion Rt antecubital space that later required I&D by ortho due to incr drainage; + DIC with septic pulmonary emboli and necrotizing of tips of all his digits (also due to use of vasopressors); cultures + for Fusobacterium Necrophorum   PT Comments   Pt admitted with above. Pt currently with functional limitations due to weakness, poor balance and poor endurance for activity.  MD:  Please advise as to weight bearing restrictions as pt states that "the doctor told me I can't put weight on my legs or I will ruin them."  PT performing ROM/Strength and sitting balance but will progress if able.   Pt will benefit from skilled PT to increase their independence and safety with mobility to allow discharge to the venue listed below.   Follow Up Recommendations  CIR                 Equipment Recommendations  Other (comment) (TBA)    Recommendations for Other Services OT consult  Frequency Min 3X/week   Progress towards PT Goals Progress towards PT goals: Not progressing toward goals - comment (limited by unsure of weight bearing restrictions.)  Plan Current plan remains appropriate    Precautions / Restrictions Precautions Precautions: Fall Precaution Comments: unable to use hands for functional tasks Restrictions Weight Bearing Restrictions: No   Pertinent Vitals/Pain VSS,  Bil UE and LE pain    Mobility  Bed Mobility Bed Mobility: Rolling Left;Left Sidelying to Sit;Sitting - Scoot to [place] of Bed Rolling Right: Not tested (comment) Rolling Left: 2: Max assist Left Sidelying to Sit: 1: +2 Total assist Left Sidelying to Sit: Patient Percentage: 30% Sitting - Scoot to Edge of Bed: 2: Max assist Sit to Sidelying Left: 1: +2 Total assist Sit to Sidelying Left: Patient Percentage: 40% Scooting to HOB: 1: +2 Total assist Scooting to [facility]: Patient Percentage: 0% Details for Bed Mobility Assistance: Pt able to follow commands well during session, however requires assist for flexing LE and initiating movement of torso and to protect UE.  Took incr time to get to EOB due to pain. Transfers Transfers: Not assessed Ambulation/Gait Ambulation/Gait Assistance: Not tested (comment) Stairs: No Wheelchair Mobility Wheelchair Mobility: No    Exercises General Exercises - Lower Extremity Ankle Circles/Pumps: AROM;Both;20 reps;Supine Quad Sets: Strengthening;Both;10 reps;Supine Heel Slides: AAROM;Both;Supine;10 reps Hip ABduction/ADduction: AAROM;Left;Supine;10 reps Straight Leg Raises: AAROM;Both;10 reps Shoulder Exercises Shoulder Flexion: AAROM;Both;10 reps;Supine Shoulder ABduction: AAROM;Both;10 reps;Supine Elbow Flexion: AAROM;Both;10 reps;Supine   PT Goals (current goals can now be found in the care plan section)    Visit Information  Last PT Received On: 08/23/13 Assistance Needed: +2 History of Present Illness: 52 year old male presented to [facility] 10/28 with AMS/Sepsis in setting of cocaine /oxycodone use. Pt admitted with Respiratory Failure (ARDS)/Sepsis has developed acute renal failure, thrombocytopenia, a-fib Flutter with RVR and was transferred to [facility] on 10/31 for HD and further care. Pt with multisystem organ failure requiring dialysis; intubated until  11/09; had punch biopsy of skin lesion Rt antecubital space that later required I&D by  ortho due to incr drainage; + DIC with septic pulmonary emboli and necrotizing of tips of all his digits (also due to use of vasopressors); cultures + for Fusobacterium Necrophorum    Subjective Data  Subjective: "I got myself into this mess and I will get myself out of it."   Cognition  Cognition Arousal/Alertness: Awake/alert Behavior During Therapy: WFL for tasks assessed/performed Overall Cognitive Status: Within Functional Limits for tasks assessed Area of Impairment: Problem solving Orientation Level: Time Problem Solving: Slow processing;Decreased initiation    Balance  Static Sitting Balance Static Sitting - Balance Support: Feet supported;Bilateral upper extremity supported Static Sitting - Level of Assistance: 5: Stand by assistance Static Sitting - Comment/# of Minutes: Sat a total of 6 min at EOB with supervision only.   End of Session PT - End of Session Equipment Utilized During Treatment: Oxygen Activity Tolerance: Patient limited by pain;Patient limited by fatigue Patient left: in bed;with call bell/phone within reach Nurse Communication: Mobility status;Need for lift equipment        INGOLD,Teyonna Plaisted 08/23/2013, 3:53 PM [facility] Acute Rehabilitation [phone] (pager)

## 2013-08-23 NOTE — Progress Notes (Signed)
Patient arrived back on unit from IR. Vitals stable. Will continue to monitor vitals.

## 2013-08-23 NOTE — Progress Notes (Signed)
I cosign Emily Hipp's (student nurse) assessment, I&O, care plan, education, and med administration.    

## 2013-08-23 NOTE — Progress Notes (Signed)
[facility] PROGRESS NOTE  Timothy Kline UJW:[id number] DOB: [birth year] DOA: 08/04/2013 PCP: No primary provider on file.  Brief Narrative: Pt is a 52 year old male seen initially at [facility] 10/28 with AMS/Sepsis in setting of cocaine /oxycodone use. He was admitted with Respiratory Failure (ARDS)/Sepsis and developed acute renal failure, thrombocytopenia, a-fib Flutter with RVR and was transferred to [facility] service here at [facility] on 10/31 for HD and further care.  SIGNIFICANT EVENTS / STUDIES:  10/28 Admission to [facility]  10/29 Metabolic Acidosis, pH=6.9  10/31 Transferred to [facility] for HD.  10/31 Cardioversion for Afib/Flutter rate 170's ( Unsuccessful). Amiodarone initiated for Afib/Flutter RVR  10/31 Renal consult: CRRT initiated  10/31 Heme consult: TCP likely due to DIC. Doubt TTP/HUS  11/01 Echocardiogram: LVEF 20-25%. Diffuse HK  11/01 ID consult  11/01 Skin biopsy: thrombotic vasculopathic reaction  11/2 tolerated HD, off pressors. Platelet transfusion for thrombocytopenia  11/3 CXR with new pulmonary opacities/nodules. Concern for septic emboli.  11/03 CT chest: Multilobar bilateral areas of pulmonary parenchymal nodular consolidation with internal necrosis/ cavitation and suggestion of surrounding hazy opacity. The appearance is most typical for septic emboli  11/03 CT head: NAD  11/03 BCx from [place] 2/2 + for FUSOBACTERIUM NECROPHORUM.  11/04 CT neck (noncontrasted): No evidence of jugular vein thrombosis, abscesses, inflammation.  11/05 Jugular vein [place]: No thrombus noted  11/04 Converted to NSR from Afib/Aflutter  11/05 Dexmedetomidine initiated in anticipation of extubation soon.  11/06 C Diff negative, Tracheal aspirate few yeast  11/06 Continued fevers, restart vanc for possible MRSA PNA  11/07 Sinus Tract R antecubital arm at site of previous punch bx discovered  11/08 MRI R forearm/elbow/humerus: inadequate study due to inability to cooperate  11/09  Debridement and lavage of RUE wound (Coley). No joint space infxn  11/09 Hypotension > norepi. Acute blood loss anemia (probably post op) > 2 units RBCs. Weaned off norepi after RBCs  11/09 Vasc surg consult for digital gangrene. 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  - Patient's blood cultures from [place] grew Fusobacterium Necrophorum - he was placed on antibiotics with cefepime and vancomycin (had been on Rocephin/Levaquin/Vanc at [place]) - ID was consulted and has been following patient for antibiotic recommendations - He also required vasopressors and as he improved clinically and blood pressure stabilized this were discontinued - WBC still  up today, afebrile>> ID recs as per 11/17 note>> will change to invanz, and CVL is to be removed as it was put in while pt was bacteremic>> IR placed new tunnelled line 11/19, old CVL removed - needs 4 weeks of IV Invanz then Augmentin until CXR cavitary changes resolve Multifocal cavitary pneumonia /Acute respiratory failure/ARDS - Patient was on the vent through11/10 and he was extubated, respiratory status remained stable. Abx as above. Thrombocytopenia with coagulopathy - secondary to Sepsis and DIC - As treated with antibiotics as discussed above, hematology was consulted and followed - improving.  Anemia/Heme + stool - Per GI his anemia is multifactorial and endoscopic evaluation would not change his clinical outcome at this time. 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 - per vascular no evidence  of ascending cellulitis; no immediate surgical intervention needed - Dr. Myra Gianotti would like to time any possible foot surgeries with any hand surgeries to avoid additional anesthetic risks - Talked with Dr. Myra Gianotti 11/18 and he states he is awaiting Dr Louanna Raw surgeons input on timing of surgery>> Dr. Donna Bernard called Dr Debby Bud office 11/18 and he is to see pt and a make further recs on remmendations and timing for management of gangrenous fingers. Awaiting consult today. a-fib Flutter with RVR  - S/P10/31 Cardioversion for Afib/Flutter rate 170's ( Unsuccessful).  - also was treated with Amiodarone  for Afib/Flutter RVR no dc'ed - appreciate cards assistance, rate remained controlled on current metoprolol TID Cardiomyopathy-Echocardiogram: LVEF 20-25%. - With increased peripheral edema noted, patient was followed an volume managed with dialysis per renal through 11/9 - Not on diuretics as this has been limited by Hypotension(now resolved) and poor renal function-improving but Cr still elevated>>see as discussed below Acute renal failure  -CRRT started 10/31 transitioned to intermittent dialysis Last dialysis 11/9. 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  - Treated with meropenem and Vancomycin,Vanc DC'd 11/13, then meropenem, narrowed to Invanz 11/17 Rash/Thrombotic vasculopathic reaction -s/p Skin biopsy per derm on 11/1; thrombotic vasculopathic reaction  Diarrhea -C Diff negative11/06  - flexiseal now with decreased stool output >>will DC Hypokalemia  Resolved, follow cocaine /oxycodone use Hepatitis C Lemierre syndrome  Code Status: partial Family Communication: none at bedside  Disposition Plan: transfer to  tele  Consultants:  GI  Vascular  ID  Renal  Heme/onc  Dermatology  cardiololgy  Procedures: LINES / TUBES:  R IJ CVL 10/28 >>>   ETT 10/29 >> 11/10  10/31 Cardioversion for Afib/Flutter rate 170's ( Unsuccessful) R femoral HD Cath 11/01 >>>11/12 11/01 Echocardiogram: LVEF 20-25%. Diffuse HK 11/01 Skin biopsy: thrombotic vasculopathic reaction  11/09 Debridement and lavage of RUE wound (Coley). No joint space infxn  11/10 Extubated, tolerated well  CULTURES:  [facility] >> + Fusobacterium Necrophorum  BCx2 10/31>>> Negative  Sputum 10/31>> Few yeast  Tracheal Asp. 11/05>> Rare WBC, no organisms  C Diff 11/5 >> Negative  RUE wound 11/7 >> Negative   Antibiotics: Rocephin: 10/28?>>>10/31  Levaquin: 10/28>>>10/31  Vancomycin 10/28>>> 11/3  Cefepime 10/31>>> 11/3  Flagyl 11/2 >> 11/3  Meropenem 11/3 >> 11/17 Vancomycin 11/6 >> 11/13 Invanz 11/17 >>    HPI/Subjective: - no new complaints.   Objective: Filed Vitals:   08/23/13 1150  BP: 114/71  Pulse: 88  Temp:   Resp:     Intake/Output Summary (Last 24 hours) at 08/23/13 1451 Last data filed at 08/23/13 1444  Gross per 24 hour  Intake   1404 ml  Output   3775 ml  Net  -2371 ml   Filed Weights   08/21/13 0458 08/22/13 0446 08/23/13 0716  Weight: 89.9 kg (198 lb 3.1 oz) 88 kg (194 lb 0.1 oz) 85.9 kg (189 lb 6 oz)   Exam:  General: alert & oriented x 3 In NAD Cardiovascular: RRR nl S1 s2 Respiratory: Decreased air entry bil, no crackles or wheezes Abdomen: soft +BS NT/ND, no masses palpable Extremities: +2 edema, dry gangrene of toes bilat. And fingers L>R dresssing over RUE wound clean and dry  Data Reviewed: Basic  Metabolic Panel:  Recent Labs Lab 08/19/13 0446 08/20/13 0500 08/21/13 0615 08/22/13 0813 08/23/13 0450  NA 141 139 140 138 138  K 3.7 3.9 4.3 4.3 4.8  CL 107 107 107 105 105  CO2 21 21 23 25 24   GLUCOSE 100* 94 95 101* 92  BUN 88* 74* 70* 63* 62*   CREATININE 2.89* 2.89* 2.80* 2.91* 2.74*  CALCIUM 7.5* 7.2* 7.2* 7.6* 7.8*  MG 1.7  --   --   --   --   PHOS 7.3*  --   --   --   --    CBC:  Recent Labs Lab 08/17/13 0500 08/18/13 0500  08/19/13 0446 08/20/13 0500 08/21/13 0615 08/22/13 0813 08/23/13 0450  WBC 3.8* 5.4  < > 9.6 19.6* 19.7* 20.2* 18.1*  NEUTROABS 2.2 3.5  --   --   --   --   --   --   HGB 7.0* 6.6*  < > 10.2* 8.6* 8.5* 8.6* 8.6*  HCT 20.9* 20.1*  < > 29.6* 26.0* 25.8* 25.5* 26.3*  MCV 90.9 90.5  < > 89.7 91.5 93.1 92.1 92.9  PLT 120* 115*  < > 77* 58* 49* 63* 85*  < > = values in this interval not displayed.  CBG:  Recent Labs Lab 08/22/13 1657 08/22/13 2221 08/22/13 2350 08/23/13 0628 08/23/13 1123  GLUCAP 96 81 89 83 102*    Recent Results (from the past 240 hour(s))  CLOSTRIDIUM DIFFICILE BY PCR     Status: None   Collection Time    08/16/13  5:26 PM      Result Value Range Status   C difficile by pcr NEGATIVE  NEGATIVE Final     Studies: Ir Fluoro Guide Cv Line Left  08/23/2013   INDICATION: 52 year old with sepsis and renal insufficiency.  EXAM: FLUOROSCOPIC AND ULTRASOUND GUIDED PLACEMENT OF A TUNNELED CENTRAL VENOUS CATHETER  Physician: Rachelle Hora. Henn, MD  MEDICATIONS: None  ANESTHESIA/SEDATION: None  FLUOROSCOPY TIME:  2 min and 12 seconds  PROCEDURE: Informed consent was obtained for placement of a tunneled central vena catheter. The patient was placed supine on the interventional table. Ultrasound confirmed a patent left internal jugularvein. Patient already had a right jugular catheter in place. Ultrasound images were obtained for documentation. The left side of the neck was prepped and draped in a sterile fashion. The left side of the neck was anesthetized with 1% lidocaine. Maximal barrier sterile technique was utilized including caps, mask, sterile gowns, sterile gloves, sterile drape, hand hygiene and skin antiseptic. A small incision was made with #11 blade scalpel. A 21 gauge needle  directed into the left internal jugular vein with ultrasound guidance. A micropuncture dilator set was placed. A dual lumen Powerline catheter was selected. The skin below the left clavicle was anesthetized and a small incision was made with an #11 blade scalpel. A subcutaneous tunnel was formed to the vein dermatotomy site. The catheter was brought through the tunnel. The cuff was placed underneath the skin. The vein dermatotomy site was dilated to accommodate a peel-away sheath. The catheter was placed through the peel-away sheath and directed into the central venous structures. The tip of the catheter was placed in the lower SVC with fluoroscopy. Fluoroscopic images were obtained for documentation. Both lumens were found to aspirate and flush well. The vein dermatotomy site was closed using Dermabond. The catheter was secured to the skin using Prolene suture.  COMPLICATIONS: None  FINDINGS: Catheter tip in the lower SVC.  IMPRESSION: Successful placement of a tunneled central venous catheter using ultrasound and fluoroscopic guidance.   Electronically Signed   By: Richarda Overlie M.D.   On: 08/23/2013 12:19   Ir US Guide Vasc Access Left  08/23/2013   INDICATION: 52 year old with sepsis and renal insufficiency.  EXAM: FLUOROSCOPIC AND ULTRASOUND GUIDED PLACEMENT OF A TUNNELED CENTRAL VENOUS CATHETER  Physician: Rachelle Hora. Henn, MD  MEDICATIONS: None  ANESTHESIA/SEDATION: None  FLUOROSCOPY TIME:  2 min and 12 seconds  PROCEDURE: Informed consent was obtained for placement of a tunneled central vena catheter. The patient was placed supine on the interventional table. Ultrasound confirmed a patent left internal jugularvein. Patient already had a right jugular catheter in place. Ultrasound images were obtained for documentation. The left side of the neck was prepped and draped in a sterile fashion. The left side of the neck was anesthetized with 1% lidocaine. Maximal barrier sterile technique was utilized including caps,  mask, sterile gowns, sterile gloves, sterile drape, hand hygiene and skin antiseptic. A small incision was made with #11 blade scalpel. A 21 gauge needle directed into the left internal jugular vein with ultrasound guidance. A micropuncture dilator set was placed. A dual lumen Powerline catheter was selected. The skin below the left clavicle was anesthetized and a small incision was made with an #11 blade scalpel. A subcutaneous tunnel was formed to the vein dermatotomy site. The catheter was brought through the tunnel. The cuff was placed underneath the skin. The vein dermatotomy site was dilated to accommodate a peel-away sheath. The catheter was placed through the peel-away sheath and directed into the central venous structures. The tip of the catheter was placed in the lower SVC with fluoroscopy. Fluoroscopic images were obtained for documentation. Both lumens were found to aspirate and flush well. The vein dermatotomy site was closed using Dermabond. The catheter was secured to the skin using Prolene suture.  COMPLICATIONS: None  FINDINGS: Catheter tip in the lower SVC.  IMPRESSION: Successful placement of a tunneled central venous catheter using ultrasound and fluoroscopic guidance.   Electronically Signed   By: Richarda Overlie M.D.   On: 08/23/2013 12:19   Dg Chest Port 1 View  08/22/2013   CLINICAL DATA:  Check the position of central line.  EXAM: PORTABLE CHEST - 1 VIEW  COMPARISON:  08/15/2013, 08/14/2013.  FINDINGS: There is a right-sided jugular central venous catheter with the tip projecting over the SVC IJ confluence. Marland Kitchen Heart size and mediastinum are stable.  There are bilateral nodular airspace opacities primarily involving the lower lungs with some of these opacities demonstrating cavitation. The overall appearance is most consistent with septic emboli.  IMPRESSION: Right-sided jugular central venous catheter with the tip projecting over the SVC IJV confluence.  Bilateral nodular airspace opacities  again noted in the lower lungs most consistent with septic emboli.   Electronically Signed   By: Elige Ko   On: 08/22/2013 09:19    Scheduled Meds: . antiseptic oral rinse  15 mL Mouth Rinse BID  . ertapenem  1 g Intravenous Daily  . feeding supplement (ENSURE COMPLETE)  237 mL Oral BID BM  . metoprolol tartrate  25 mg Oral TID  . 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 If 7PM-7AM, please contact night-coverage at [email or web address], password [facility] 08/23/2013, 2:51 PM  LOS: 19 days

## 2013-08-23 NOTE — ED Notes (Signed)
Pt does not believe he will need sedation for procedure.  Will stand by with meds if needed.  Has been npo.

## 2013-08-23 NOTE — Procedures (Signed)
Placement of left jugular tunneled central venous catheter.  Tip at SVC/RA junction.  Catheter is ready to use.  No immediate complication.

## 2013-08-23 NOTE — Progress Notes (Signed)
PT Cancellation Note  Patient Details Name: Timothy Kline MRN: [id number] DOB: Apr 09, 1961   Cancelled Treatment:    Reason Eval/Treat Not Completed: Patient at procedure or test/unavailable   INGOLD,Jadore Veals 08/23/2013, 11:13 AM  [facility] [phone] (pager)

## 2013-08-24 LAB — BASIC METABOLIC PANEL
BUN: 59 mg/dL — ABNORMAL HIGH (ref 6–23)
CO2: 25 mEq/L (ref 19–32)
Chloride: 102 mEq/L (ref 96–112)
Creatinine, Ser: 2.72 mg/dL — ABNORMAL HIGH (ref 0.50–1.35)
GFR calc non Af Amer: 25 mL/min — ABNORMAL LOW (ref >90)

## 2013-08-24 LAB — CBC
HCT: 25.9 % — ABNORMAL LOW (ref 39.0–52.0)
Hemoglobin: 8.5 g/dL — ABNORMAL LOW (ref 13.0–17.0)
MCHC: 32.8 g/dL (ref 30.0–36.0)
MCV: 92.8 fL (ref 78.0–100.0)
RBC: 2.79 MIL/uL — ABNORMAL LOW (ref 4.22–5.81)
WBC: 15.6 10*3/uL — ABNORMAL HIGH (ref 4.0–10.5)

## 2013-08-24 LAB — GLUCOSE, CAPILLARY
Glucose-Capillary: 85 mg/dL (ref 70–99)
Glucose-Capillary: 90 mg/dL (ref 70–99)

## 2013-08-24 NOTE — Progress Notes (Signed)
Subjective  -   The patient has been transferred to the floor.  He is resting comfortably.  He denies any significant pain in his toes.  He denies chest pains.  He denies trouble breathing.   Physical Exam:  No acute distress Cardiovascular: Regular rhythm.  Palpable dorsalis pedis pulses bilaterally. Pulmonary: Nonlabored breathing Abdomen: Soft nontender Extremities/musculoskeletal:dry gangrene to the distal part of the right foot extending down onto the plantar surface.  Dry gangrene to all 5 digits on the left.  No drainage.  Reactive hyperemia but no erythema suggesting infection       Assessment/Plan:    The patient has dry gangrene to both distal feet.  I suspect at this time he'll require a right transmetatarsal amputation and amputation of all 5 digits on the left foot.  There is no evidence of infection at this time, and the patient is not experiencing any significant pain.  Therefore potentially this could be done electively.  There is a was outside chance that the patient may autoamputated several digits which would limit a surgical removal.  I discussed the options of moving forward and proceed with amputation early vs. Delayed amputation and attempting autoamputation.  I will try to coordinate this with the recommendations on treatment of the left hand.  BRABHAM IV, V. WELLS 08/24/2013 2:59 PM --  Filed Vitals:   08/24/13 0956  BP: 130/70  Pulse: 84  Temp:   Resp: 18    Intake/Output Summary (Last 24 hours) at 08/24/13 1459 Last data filed at 08/24/13 1232  Gross per 24 hour  Intake    840 ml  Output   3650 ml  Net  -2810 ml     Laboratory CBC    Component Value Date/Time   WBC 15.6* 08/24/2013 0535   HGB 8.5* 08/24/2013 0535   HCT 25.9* 08/24/2013 0535   PLT 94* 08/24/2013 0535    BMET    Component Value Date/Time   NA 137 08/24/2013 0535   K 4.6 08/24/2013 0535   CL 102 08/24/2013 0535   CO2 25 08/24/2013 0535   GLUCOSE 93 08/24/2013  0535   BUN 59* 08/24/2013 0535   CREATININE 2.72* 08/24/2013 0535   CALCIUM 7.8* 08/24/2013 0535   GFRNONAA 25* 08/24/2013 0535   GFRAA 29* 08/24/2013 0535    COAG Lab Results  Component Value Date   INR 1.19 08/14/2013   INR 1.36 08/08/2013   INR 1.55* 08/06/2013   No results found for this basename: PTT    Antibiotics Anti-infectives   Start     Dose/Rate Route Frequency Ordered Stop   08/23/13 1000  ertapenem (INVANZ) 1 g in sodium chloride 0.9 % 50 mL IVPB  Status:  Discontinued     1 g 100 mL/hr over 30 Minutes Intravenous Every 24 hours 08/22/13 1017 08/22/13 1034   08/22/13 1200  ertapenem (INVANZ) 1 g in sodium chloride 0.9 % 50 mL IVPB  Status:  Discontinued     1 g 100 mL/hr over 30 Minutes Intravenous Every 24 hours 08/22/13 1034 08/22/13 1047   08/22/13 1100  ertapenem (INVANZ) 1 g in sodium chloride 0.9 % 50 mL IVPB     1 g 100 mL/hr over 30 Minutes Intravenous Daily 08/22/13 1047     08/15/13 0800  vancomycin (VANCOCIN) IVPB 1000 mg/200 mL premix  Status:  Discontinued     1,000 mg 200 mL/hr over 60 Minutes Intravenous Every 24 hours 08/15/13 0744 08/17/13 0931   08/15/13  0800  meropenem (MERREM) 500 mg in sodium chloride 0.9 % 50 mL IVPB  Status:  Discontinued     500 mg 100 mL/hr over 30 Minutes Intravenous Every 12 hours 08/15/13 0749 08/22/13 1017   08/12/13 1637  polymyxin B 500,000 Units, bacitracin 50,000 Units in sodium chloride irrigation 0.9 % 500 mL irrigation  Status:  Discontinued       As needed 08/12/13 1638 08/12/13 1703   08/12/13 1300  vancomycin (VANCOCIN) IVPB 1000 mg/200 mL premix     1,000 mg 200 mL/hr over 60 Minutes Intravenous  Once 08/12/13 1147 08/12/13 1441   08/11/13 1830  meropenem (MERREM) 1 g in sodium chloride 0.9 % 100 mL IVPB  Status:  Discontinued     1 g 200 mL/hr over 30 Minutes Intravenous Every 24 hours 08/11/13 1145 08/15/13 0749   08/11/13 1100  vancomycin (VANCOCIN) IVPB 750 mg/150 ml premix     750 mg 150 mL/hr over  60 Minutes Intravenous  Once 08/11/13 1026 08/11/13 1321   08/10/13 1745  vancomycin (VANCOCIN) IVPB 750 mg/150 ml premix     750 mg 150 mL/hr over 60 Minutes Intravenous  Once 08/10/13 1734 08/10/13 1920   08/07/13 1830  meropenem (MERREM) 500 mg in sodium chloride 0.9 % 50 mL IVPB  Status:  Discontinued     500 mg 100 mL/hr over 30 Minutes Intravenous Every 24 hours 08/07/13 1745 08/11/13 1145   08/06/13 2000  vancomycin (VANCOCIN) IVPB 750 mg/150 ml premix     750 mg 150 mL/hr over 60 Minutes Intravenous  Once 08/06/13 1056 08/07/13 0330   08/05/13 2200  ceFEPIme (MAXIPIME) 1 g in dextrose 5 % 50 mL IVPB  Status:  Discontinued     1 g 100 mL/hr over 30 Minutes Intravenous Every 24 hours 08/04/13 1815 08/07/13 1656   08/05/13 1200  metroNIDAZOLE (FLAGYL) IVPB 500 mg  Status:  Discontinued     500 mg 100 mL/hr over 60 Minutes Intravenous Every 8 hours 08/05/13 1049 08/07/13 1656   08/04/13 1630  ceFEPIme (MAXIPIME) 2 g in dextrose 5 % 50 mL IVPB     2 g 100 mL/hr over 30 Minutes Intravenous  Once 08/04/13 1604 08/04/13 1747       V. Charlena Cross, M.D. [facility] Office: [phone] Pager:  [phone]

## 2013-08-24 NOTE — Consult Note (Signed)
WOC wound follow up Assisted bedside nurse with vac dressing change.  Applied one piece black sponge to 100 mm cont suction.  Pt tolerated without c/o pain.  Wound has significant improvement since application and depth is now .1cm.  Plan for vac to be D/C on Sat and moist gauze packing may be applied at that time. Please re-consult if further assistance is needed.  Thank-you,  Cammie Mcgee MSN, RN, CWOCN, [place], CNS [phone]

## 2013-08-24 NOTE — Progress Notes (Signed)
[facility] PROGRESS NOTE  Timothy Kline DGU:[id number] DOB: 13-Aug-1961 DOA: 08/04/2013 PCP: No primary provider on file.  Brief Narrative: Pt is a 52 year old male seen initially at [facility] 10/28 with AMS/Sepsis in setting of cocaine /oxycodone use. He was admitted with Respiratory Failure (ARDS)/Sepsis and developed acute renal failure, thrombocytopenia, a-fib Flutter with RVR and was transferred to [facility] service here at [facility] on 10/31 for HD and further care.  SIGNIFICANT EVENTS / STUDIES:  10/28 Admission to [facility]  10/29 Metabolic Acidosis, pH=6.9  10/31 Transferred to [facility] for HD.  10/31 Cardioversion for Afib/Flutter rate 170's ( Unsuccessful). Amiodarone initiated for Afib/Flutter RVR  10/31 Renal consult: CRRT initiated  10/31 Heme consult: TCP likely due to DIC. Doubt TTP/HUS  11/01 Echocardiogram: LVEF 20-25%. Diffuse HK  11/01 ID consult  11/01 Skin biopsy: thrombotic vasculopathic reaction  11/2 tolerated HD, off pressors. Platelet transfusion for thrombocytopenia  11/3 CXR with new pulmonary opacities/nodules. Concern for septic emboli.  11/03 CT chest: Multilobar bilateral areas of pulmonary parenchymal nodular consolidation with internal necrosis/ cavitation and suggestion of surrounding hazy opacity. The appearance is most typical for septic emboli  11/03 CT head: NAD  11/03 BCx from [place] 2/2 + for FUSOBACTERIUM NECROPHORUM.  11/04 CT neck (noncontrasted): No evidence of jugular vein thrombosis, abscesses, inflammation.  11/05 Jugular vein [place]: No thrombus noted  11/04 Converted to NSR from Afib/Aflutter  11/05 Dexmedetomidine initiated in anticipation of extubation soon.  11/06 C Diff negative, Tracheal aspirate few yeast  11/06 Continued fevers, restart vanc for possible MRSA PNA  11/07 Sinus Tract R antecubital arm at site of previous punch bx discovered  11/08 MRI R forearm/elbow/humerus: inadequate study due to inability to cooperate  11/09  Debridement and lavage of RUE wound (Coley). No joint space infxn  11/09 Hypotension > norepi. Acute blood loss anemia (probably post op) > 2 units RBCs. Weaned off norepi after RBCs  11/09 Vasc surg consult for digital gangrene. 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 - per vascular no evidence of ascending cellulitis; no immediate surgical intervention needed - Dr. Myra Gianotti would like to time any possible foot surgeries with any hand surgeries to avoid additional anesthetic risks - Talked with Dr. Myra Gianotti 11/18 and he states he is awaiting Dr Louanna Raw surgeons input on timing of surgery>> Dr. Donna Bernard called Dr Debby Bud office 11/18 and he is to see pt and a make further recs on remmendations and timing for management of gangrenous fingers. I called office again today.  - his clinical condition is slowly improving. Platelets improving.  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  - Patient's blood cultures from [place] grew Fusobacterium Necrophorum - he was placed on antibiotics with cefepime and vancomycin (had been on Rocephin/Levaquin/Vanc at [place]) - ID was consulted and has been following patient for antibiotic recommendations - He also required vasopressors and as he improved clinically and blood pressure stabilized this were discontinued - WBC still  up today, afebrile>> ID recs as per 11/17 note>> will change to invanz, and CVL is to be removed as it was put in while pt was bacteremic>> IR placed new tunnelled line  11/19, old CVL removed - needs 4 weeks of IV Invanz then Augmentin until CXR cavitary changes resolve Multifocal cavitary  pneumonia /Acute respiratory failure/ARDS - Patient was on the vent through11/10 and he was extubated, respiratory status remained stable. Abx as above. Thrombocytopenia with coagulopathy - secondary to Sepsis and DIC - As treated with antibiotics as discussed above, hematology was consulted and followed - improving.  Anemia/Heme + stool - Per GI his anemia is multifactorial and endoscopic evaluation would not change his clinical outcome at this time. - hgb stable, and no gross bleeding - continue to monitor hgb and transfuse as appropriate  a-fib Flutter with RVR  - S/P10/31 Cardioversion for Afib/Flutter rate 170's ( Unsuccessful).  - also was treated with Amiodarone  for Afib/Flutter RVR no dc'ed - appreciate cards assistance, rate remained controlled on current metoprolol TID Cardiomyopathy-Echocardiogram: LVEF 20-25%. - With increased peripheral edema noted, patient was followed an volume managed with dialysis per renal through 11/9 - Not on diuretics as this has been limited by Hypotension(now resolved) and poor renal function-improving but Cr still elevated>>see as discussed below Acute renal failure  -CRRT started 10/31 transitioned to intermittent dialysis Last dialysis 11/9. 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  - Treated with meropenem and Vancomycin,Vanc DC'd 11/13, then meropenem, narrowed to Invanz 11/17 Rash/Thrombotic vasculopathic reaction -s/p Skin biopsy per derm on 11/1; thrombotic vasculopathic reaction  Diarrhea -C Diff negative11/06  - flexiseal now with decreased stool output >>will DC Hypokalemia  Resolved, follow cocaine /oxycodone use Hepatitis C Lemierre syndrome  Code Status: partial Family Communication: none at bedside  Disposition Plan: transfer to  tele  Consultants:  GI  Vascular  ID  Renal  Heme/onc  Dermatology  cardiololgy  Procedures: LINES / TUBES:  R IJ CVL 10/28 >>>   ETT 10/29 >> 11/10  10/31 Cardioversion for Afib/Flutter rate 170's ( Unsuccessful) R femoral HD Cath 11/01 >>>11/12 11/01 Echocardiogram: LVEF 20-25%. Diffuse HK 11/01 Skin biopsy: thrombotic vasculopathic reaction  11/09 Debridement and lavage of RUE wound (Coley). No joint space infxn  11/10 Extubated, tolerated well  CULTURES:  [facility] >> + Fusobacterium Necrophorum  BCx2 10/31>>> Negative  Sputum 10/31>> Few yeast  Tracheal Asp. 11/05>> Rare WBC, no organisms  C Diff 11/5 >> Negative  RUE wound 11/7 >> Negative   Antibiotics: Rocephin: 10/28?>>>10/31  Levaquin: 10/28>>>10/31  Vancomycin 10/28>>> 11/3  Cefepime 10/31>>> 11/3  Flagyl 11/2 >> 11/3  Meropenem 11/3 >> 11/17 Vancomycin 11/6 >> 11/13 Invanz 11/17 >>   HPI/Subjective: - no new complaints.   Objective: Filed Vitals:   08/24/13 0956  BP: 130/70  Pulse: 84  Temp:   Resp: 18    Intake/Output Summary (Last 24 hours) at 08/24/13 1107 Last data filed at 08/24/13 9604  Gross per 24 hour  Intake   1277 ml  Output   3200 ml  Net  -1923 ml   Filed Weights   08/22/13 0446 08/23/13 0716 08/24/13 0508  Weight: 88 kg (194 lb 0.1 oz) 85.9 kg (189 lb 6 oz) 85.6 kg (188 lb 11.4 oz)   Exam:  General: alert & oriented x 3 In NAD Cardiovascular: RRR nl S1 s2 Respiratory: Decreased air entry bil, no crackles or wheezes Abdomen: soft +BS NT/ND, no masses palpable Extremities: +2 edema, dry gangrene of toes bilat. And fingers  L>R dresssing over RUE wound clean and dry  Data Reviewed: Basic Metabolic Panel:  Recent Labs Lab 08/19/13 0446 08/20/13 0500 08/21/13 0615 08/22/13 0813 08/23/13 0450 08/24/13 0535  NA 141 139 140 138 138 137  K 3.7 3.9 4.3 4.3 4.8 4.6  CL 107 107 107 105 105 102  CO2 21 21 23 25 24 25   GLUCOSE 100* 94 95 101* 92 93   BUN 88* 74* 70* 63* 62* 59*  CREATININE 2.89* 2.89* 2.80* 2.91* 2.74* 2.72*  CALCIUM 7.5* 7.2* 7.2* 7.6* 7.8* 7.8*  MG 1.7  --   --   --   --   --   PHOS 7.3*  --   --   --   --   --    CBC:  Recent Labs Lab 08/18/13 0500  08/20/13 0500 08/21/13 0615 08/22/13 0813 08/23/13 0450 08/24/13 0535  WBC 5.4  < > 19.6* 19.7* 20.2* 18.1* 15.6*  NEUTROABS 3.5  --   --   --   --   --   --   HGB 6.6*  < > 8.6* 8.5* 8.6* 8.6* 8.5*  HCT 20.1*  < > 26.0* 25.8* 25.5* 26.3* 25.9*  MCV 90.5  < > 91.5 93.1 92.1 92.9 92.8  PLT 115*  < > 58* 49* 63* 85* 94*  < > = values in this interval not displayed.  CBG:  Recent Labs Lab 08/23/13 0628 08/23/13 1123 08/23/13 1650 08/23/13 2356 08/24/13 0612  GLUCAP 83 102* 117* 90 95    Recent Results (from the past 240 hour(s))  CLOSTRIDIUM DIFFICILE BY PCR     Status: None   Collection Time    08/16/13  5:26 PM      Result Value Range Status   C difficile by pcr NEGATIVE  NEGATIVE Final   Studies: Ir Fluoro Guide Cv Line Left  08/23/2013   INDICATION: 52 year old with sepsis and renal insufficiency.  EXAM: FLUOROSCOPIC AND ULTRASOUND GUIDED PLACEMENT OF A TUNNELED CENTRAL VENOUS CATHETER  Physician: Rachelle Hora. Henn, MD  MEDICATIONS: None  ANESTHESIA/SEDATION: None  FLUOROSCOPY TIME:  2 min and 12 seconds  PROCEDURE: Informed consent was obtained for placement of a tunneled central vena catheter. The patient was placed supine on the interventional table. Ultrasound confirmed a patent left internal jugularvein. Patient already had a right jugular catheter in place. Ultrasound images were obtained for documentation. The left side of the neck was prepped and draped in a sterile fashion. The left side of the neck was anesthetized with 1% lidocaine. Maximal barrier sterile technique was utilized including caps, mask, sterile gowns, sterile gloves, sterile drape, hand hygiene and skin antiseptic. A small incision was made with #11 blade scalpel. A 21 gauge  needle directed into the left internal jugular vein with ultrasound guidance. A micropuncture dilator set was placed. A dual lumen Powerline catheter was selected. The skin below the left clavicle was anesthetized and a small incision was made with an #11 blade scalpel. A subcutaneous tunnel was formed to the vein dermatotomy site. The catheter was brought through the tunnel. The cuff was placed underneath the skin. The vein dermatotomy site was dilated to accommodate a peel-away sheath. The catheter was placed through the peel-away sheath and directed into the central venous structures. The tip of the catheter was placed in the lower SVC with fluoroscopy. Fluoroscopic images were obtained for documentation. Both lumens were found to aspirate and flush well. The vein dermatotomy site was closed using Dermabond. The catheter was  secured to the skin using Prolene suture.  COMPLICATIONS: None  FINDINGS: Catheter tip in the lower SVC.  IMPRESSION: Successful placement of a tunneled central venous catheter using ultrasound and fluoroscopic guidance.   Electronically Signed   By: Richarda Overlie M.D.   On: 08/23/2013 12:19   Ir US Guide Vasc Access Left  08/23/2013   INDICATION: 52 year old with sepsis and renal insufficiency.  EXAM: FLUOROSCOPIC AND ULTRASOUND GUIDED PLACEMENT OF A TUNNELED CENTRAL VENOUS CATHETER  Physician: Rachelle Hora. Henn, MD  MEDICATIONS: None  ANESTHESIA/SEDATION: None  FLUOROSCOPY TIME:  2 min and 12 seconds  PROCEDURE: Informed consent was obtained for placement of a tunneled central vena catheter. The patient was placed supine on the interventional table. Ultrasound confirmed a patent left internal jugularvein. Patient already had a right jugular catheter in place. Ultrasound images were obtained for documentation. The left side of the neck was prepped and draped in a sterile fashion. The left side of the neck was anesthetized with 1% lidocaine. Maximal barrier sterile technique was utilized  including caps, mask, sterile gowns, sterile gloves, sterile drape, hand hygiene and skin antiseptic. A small incision was made with #11 blade scalpel. A 21 gauge needle directed into the left internal jugular vein with ultrasound guidance. A micropuncture dilator set was placed. A dual lumen Powerline catheter was selected. The skin below the left clavicle was anesthetized and a small incision was made with an #11 blade scalpel. A subcutaneous tunnel was formed to the vein dermatotomy site. The catheter was brought through the tunnel. The cuff was placed underneath the skin. The vein dermatotomy site was dilated to accommodate a peel-away sheath. The catheter was placed through the peel-away sheath and directed into the central venous structures. The tip of the catheter was placed in the lower SVC with fluoroscopy. Fluoroscopic images were obtained for documentation. Both lumens were found to aspirate and flush well. The vein dermatotomy site was closed using Dermabond. The catheter was secured to the skin using Prolene suture.  COMPLICATIONS: None  FINDINGS: Catheter tip in the lower SVC.  IMPRESSION: Successful placement of a tunneled central venous catheter using ultrasound and fluoroscopic guidance.   Electronically Signed   By: Richarda Overlie M.D.   On: 08/23/2013 12:19   Scheduled Meds: . antiseptic oral rinse  15 mL Mouth Rinse BID  . ertapenem  1 g Intravenous Daily  . feeding supplement (ENSURE COMPLETE)  237 mL Oral BID BM  . metoprolol tartrate  25 mg Oral TID  . 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 If 7PM-7AM, please contact night-coverage at [email or web address], password [facility] 08/24/2013, 11:07 AM  LOS: 20 days

## 2013-08-24 NOTE — Progress Notes (Signed)
Speech Language Pathology Treatment: Dysphagia  Patient Details Name: Malaki Koury MRN: [id number] DOB: Jul 13, 1961 Today's Date: 08/24/2013 Time: 1455-1510 SLP Time Calculation (min): 15 min  Assessment / Plan / Recommendation Clinical Impression  Patient presents with slightly improved condition of oral cavity. Oral care not provided by SLP in an attempt to reduce risk of oral bleeding prior to initiation of trials. Patient reported chewing graham crackers and that it had been difficult.  Upon discussion he reported that he wants food that you chew.  As a result, SLP facilitated session with trials of Dys.1, 2 textures and thin liquids.  Patient demonstrated a slightly prolonged oral phase, but was otherwise WFL swallow.  As a result it is recommended that this patient consume Dys.2 textures and thin liquids with continued total assist.     HPI HPI: 52 year old male presented to [facility] 10/28 with AMS/Sepsis in setting of cocaine /oxycodone use. Pt admitted with Respiratory Failure (ARDS)/Sepsis has developed acute renal failure, thrombocytopenia, a-fib Flutter with RVR and was transferred to [facility] on 10/31 for HD and further care. CCM asked to admit. Pt intubated 11/1 and extubated 11/10.   Pertinent Vitals none  SLP Plan  Continue with current plan of care    Recommendations Diet recommendations: Dysphagia 2 (fine chop);Thin liquid Liquids provided via: Straw Medication Administration: Whole meds with puree Supervision: Staff to assist with self feeding Compensations: Slow rate;Small sips/bites Postural Changes and/or Swallow Maneuvers: Seated upright 90 degrees              Oral Care Recommendations: Oral care Q4 per protocol Follow up Recommendations: Skilled Nursing facility Plan: Continue with current plan of care    GO    Charlane Ferretti., CCC-SLP [phone]  Estell Puccini 08/24/2013, 4:34 PM

## 2013-08-25 LAB — GLUCOSE, CAPILLARY
Glucose-Capillary: 94 mg/dL (ref 70–99)
Glucose-Capillary: 109 mg/dL — ABNORMAL HIGH (ref 70–99)
Glucose-Capillary: 100 mg/dL — ABNORMAL HIGH (ref 70–99)

## 2013-08-25 LAB — BASIC METABOLIC PANEL
BUN: 56 mg/dL — ABNORMAL HIGH (ref 6–23)
CO2: 25 mEq/L (ref 19–32)
Chloride: 101 mEq/L (ref 96–112)
GFR calc Af Amer: 28 mL/min — ABNORMAL LOW (ref >90)
GFR calc non Af Amer: 24 mL/min — ABNORMAL LOW (ref >90)
Potassium: 4.5 mEq/L (ref 3.5–5.1)

## 2013-08-25 LAB — CBC
HCT: 24.8 % — ABNORMAL LOW (ref 39.0–52.0)
Hemoglobin: 8.3 g/dL — ABNORMAL LOW (ref 13.0–17.0)
RBC: 2.67 MIL/uL — ABNORMAL LOW (ref 4.22–5.81)
RDW: 16.3 % — ABNORMAL HIGH (ref 11.5–15.5)
WBC: 14.2 10*3/uL — ABNORMAL HIGH (ref 4.0–10.5)

## 2013-08-25 NOTE — Progress Notes (Signed)
[facility] PROGRESS NOTE  Timothy Kline YNW:[id number] DOB: 26-Oct-1960 DOA: 08/04/2013 PCP: No primary provider on file.  Brief Narrative: Pt is a 52 year old male seen initially at [facility] 10/28 with AMS/Sepsis in setting of cocaine /oxycodone use. He was admitted with Respiratory Failure (ARDS)/Sepsis and developed acute renal failure, thrombocytopenia, a-fib Flutter with RVR and was transferred to [facility] service here at [facility] on 10/31 for HD and further care.  SIGNIFICANT EVENTS / STUDIES:  10/28 Admission to [facility]  10/29 Metabolic Acidosis, pH=6.9  10/31 Transferred to [facility] for [facility].  10/31 Cardioversion for Afib/Flutter rate 170's ( Unsuccessful). Amiodarone initiated for Afib/Flutter RVR  10/31 Renal consult: CRRT initiated  10/31 Heme consult: TCP likely due to DIC. Doubt TTP/HUS  11/01 Echocardiogram: LVEF 20-25%. Diffuse HK  11/01 ID consult  11/01 Skin biopsy: thrombotic vasculopathic reaction  11/2 tolerated HD, off pressors. Platelet transfusion for thrombocytopenia  11/3 CXR with new pulmonary opacities/nodules. Concern for septic emboli.  11/03 CT chest: Multilobar bilateral areas of pulmonary parenchymal nodular consolidation with internal necrosis/ cavitation and suggestion of surrounding hazy opacity. The appearance is most typical for septic emboli  11/03 CT head: NAD  11/03 BCx from [place] 2/2 + for FUSOBACTERIUM NECROPHORUM.  11/04 CT neck (noncontrasted): No evidence of jugular vein thrombosis, abscesses, inflammation.  11/05 Jugular vein [place]: No thrombus noted  11/04 Converted to NSR from Afib/Aflutter  11/05 Dexmedetomidine initiated in anticipation of extubation soon.  11/06 C Diff negative, Tracheal aspirate few yeast  11/06 Continued fevers, restart vanc for possible MRSA PNA  11/07 Sinus Tract R antecubital arm at site of previous punch bx discovered  11/08 MRI R forearm/elbow/humerus: inadequate study due to inability to cooperate  11/09  Debridement and lavage of RUE wound (Coley). No joint space infxn  11/09 Hypotension > norepi. Acute blood loss anemia (probably post op) > 2 units RBCs. Weaned off norepi after RBCs  11/09 Vasc surg consult for digital gangrene. 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 - per vascular no evidence of ascending cellulitis; no immediate surgical intervention needed - Dr. Myra Gianotti would like to time any possible foot surgeries with any hand surgeries to avoid additional anesthetic risks. Dr. Myra Gianotti saw again patient 11/20, to get in touch with hand and coordinate surgery. If surgery early next week, patient will stay in the hospital - Dr. Stevphen Meuse talked with Dr. Myra Gianotti 11/18 and he states he is awaiting Dr Louanna Raw surgeons input on timing of surgery>> Dr. Donna Bernard called Dr Debby Bud office 11/18 and he is to see pt and a make further recs on remmendations and timing for management of gangrenous fingers. I called hand surgery office 11/20 again. 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  - Patient's blood cultures from [place] grew Fusobacterium Necrophorum - he was placed on antibiotics with cefepime and vancomycin (had been on Rocephin/Levaquin/Vanc at [place]) - ID was consulted and has been following patient for antibiotic recommendations - He also required vasopressors and as he improved clinically and blood pressure stabilized this were discontinued - WBC still  up today, afebrile>> ID recs as per 11/17 note>> will change to invanz, and CVL  is to be removed as it was put in while pt was bacteremic>> IR placed new tunnelled line 11/19, old CVL removed -  needs 4 weeks of IV Invanz then Augmentin until CXR cavitary changes resolve Multifocal cavitary pneumonia /Acute respiratory failure/ARDS - Patient was on the vent through11/10 and he was extubated, respiratory status remained stable. Abx as above. Thrombocytopenia with coagulopathy - secondary to Sepsis and DIC - As treated with antibiotics as discussed above, hematology was consulted and followed - improving, this morning platelets above 100 Anemia/Heme + stool - Per GI his anemia is multifactorial and endoscopic evaluation would not change his clinical outcome at this time. - hgb stable, and no gross bleeding - continue to monitor hgb and transfuse as appropriate  a-fib Flutter with RVR  - S/P10/31 Cardioversion for Afib/Flutter rate 170's ( Unsuccessful).  - also was treated with Amiodarone  for Afib/Flutter RVR no dc'ed - appreciate cards assistance, rate remained controlled on current metoprolol TID Cardiomyopathy-Echocardiogram: LVEF 20-25%. - With increased peripheral edema noted, patient was followed an volume managed with dialysis per renal through 11/9 - Not on diuretics as this has been limited by Hypotension(now resolved) and poor renal function-improving but Cr still elevated>>see as discussed below Acute renal failure  -CRRT started 10/31 transitioned to intermittent dialysis Last dialysis 11/9. 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 Diffuse HK 11/01 Skin biopsy: thrombotic vasculopathic reaction  11/09 Debridement and lavage of RUE wound (Coley). No joint space infxn  11/10 Extubated, tolerated well  CULTURES:  [facility] >> + Fusobacterium Necrophorum  BCx2 10/31>>> Negative  Sputum 10/31>> Few yeast  Tracheal Asp. 11/05>> Rare WBC, no organisms  C Diff 11/5 >> Negative  RUE wound 11/7 >> Negative   Antibiotics: Rocephin: 10/28?>>>10/31  Levaquin: 10/28>>>10/31  Vancomycin 10/28>>> 11/3  Cefepime 10/31>>> 11/3  Flagyl 11/2 >> 11/3  Meropenem 11/3 >> 11/17 Vancomycin 11/6 >> 11/13 Invanz 11/17 >>    HPI/Subjective: - no new complaints.   Objective: Filed Vitals:   08/25/13 1345  BP: 106/68  Pulse: 81  Temp: 97.9 F (36.6 C)  Resp: 18    Intake/Output Summary (Last 24 hours) at 08/25/13 1412 Last data filed at 08/25/13 1342  Gross per 24 hour  Intake    702 ml  Output   3975 ml  Net  -3273 ml   Filed Weights   08/23/13 0716 08/24/13 0508 08/25/13 0602  Weight: 85.9 kg (189 lb 6 oz) 85.6 kg (188 lb 11.4 oz) 84.324 kg (185 lb 14.4 oz)   Exam:  General: alert & oriented x 3 In NAD Cardiovascular: RRR nl  S1 s2 Respiratory: Decreased air entry bil, no crackles or wheezes Abdomen: soft +BS NT/ND, no masses palpable Extremities: +2 edema, dry gangrene of toes bilat. And fingers L>R dresssing over RUE wound clean and dry  Data Reviewed: Basic Metabolic Panel:  Recent Labs Lab 08/19/13 0446  08/21/13 0615 08/22/13 0813 08/23/13 0450 08/24/13 0535  08/25/13 0500  NA 141  < > 140 138 138 137 134*  K 3.7  < > 4.3 4.3 4.8 4.6 4.5  CL 107  < > 107 105 105 102 101  CO2 21  < > 23 25 24 25 25   GLUCOSE 100*  < > 95 101* 92 93 89  BUN 88*  < > 70* 63* 62* 59* 56*  CREATININE 2.89*  < > 2.80* 2.91* 2.74* 2.72* 2.80*  CALCIUM 7.5*  < > 7.2* 7.6* 7.8* 7.8* 8.0*  MG 1.7  --   --   --   --   --   --   PHOS 7.3*  --   --   --   --   --   --   < > = values in this interval not displayed. CBC:  Recent Labs Lab 08/21/13 0615 08/22/13 0813 08/23/13 0450 08/24/13 0535 08/25/13 0500  WBC 19.7* 20.2* 18.1* 15.6* 14.2*  HGB 8.5* 8.6* 8.6* 8.5* 8.3*  HCT 25.8* 25.5* 26.3* 25.9* 24.8*  MCV 93.1 92.1 92.9 92.8 92.9  PLT 49* 63* 85* 94* 109*    CBG:  Recent Labs Lab 08/24/13 1109 08/24/13 1622 08/24/13 2313 08/25/13 0555 08/25/13 1100  GLUCAP 90 85 117* 94 109*    Recent Results (from the past 240 hour(s))  CLOSTRIDIUM DIFFICILE BY PCR     Status: None   Collection Time    08/16/13  5:26 PM      Result Value Range Status   C difficile by pcr NEGATIVE  NEGATIVE Final     Studies: No results found.  Scheduled Meds: . antiseptic oral rinse  15 mL Mouth Rinse BID  . ertapenem  1 g Intravenous Daily  . feeding supplement (ENSURE COMPLETE)  237 mL Oral BID BM  . metoprolol tartrate  25 mg Oral TID  . 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 If 7PM-7AM, please contact night-coverage at [email or web address], password [facility] 08/25/2013, 2:12 PM  LOS:  21 days

## 2013-08-25 NOTE — Progress Notes (Signed)
Speech Language Pathology Treatment: Dysphagia  Patient Details Name: Timothy Kline MRN: [id number] DOB: [birth year] Today's Date: 08/25/2013 Time: 0865-7846 SLP Time Calculation (min): 20 min  Assessment / Plan / Recommendation Clinical Impression  Pt appears to be tolerating current diet of dys 2/thin liquids, although poor intake noted at lunch today.  No further oral bleeding reported or observed.  Reviewed safe swallow precautions with pt (also psted at [facility]).  All goals met.  DC ST.   HPI HPI: 52 year old male presented to [facility] 10/28 with AMS/Sepsis in setting of cocaine /oxycodone use. Pt admitted with Respiratory Failure (ARDS)/Sepsis has developed acute renal failure, thrombocytopenia, a-fib Flutter with RVR and was transferred to [facility] on 10/31 for HD and further care. CCM asked to admit. Pt intubated 11/1 and extubated 11/10.   Pertinent Vitals VSS  SLP Plan  All goals met    Recommendations Diet recommendations: Thin liquid;Dysphagia 2 (fine chop) Liquids provided via: Straw Medication Administration: Whole meds with puree Supervision: Staff to assist with self feeding Compensations: Slow rate;Small sips/bites Postural Changes and/or Swallow Maneuvers: Seated upright 90 degrees              Oral Care Recommendations: Oral care Q4 per protocol Follow up Recommendations: Skilled Nursing facility Plan: All goals met    GO    Raeghan Demeter B. Murvin Natal [facility], CCC-SLP [phone]  [facility] 08/25/2013, 2:33 PM

## 2013-08-25 NOTE — Progress Notes (Signed)
I cosign Emily Hipp's (student nurse) assessment, I&O, care plan, education, and med administration.    

## 2013-08-25 NOTE — Progress Notes (Addendum)
Physical Therapy Treatment Patient Details Name: Braylyn Kalter MRN: [id number] DOB: May 15, 1961 Today's Date: 08/25/2013 Time: 4098-1191 PT Time Calculation (min): 31 min  PT Assessment / Plan / Recommendation  History of Present Illness 52 year old male presented to [facility] 10/28 with AMS/Sepsis in setting of cocaine /oxycodone use. Pt admitted with Respiratory Failure (ARDS)/Sepsis has developed acute renal failure, thrombocytopenia, a-fib Flutter with RVR and was transferred to [facility] on 10/31 for HD and further care. Pt with multisystem organ failure requiring dialysis; intubated until 11/09; had punch biopsy of skin lesion Rt antecubital space that later required I&D by ortho due to incr drainage; + DIC with septic pulmonary emboli and necrotizing of tips of all his digits (also due to use of vasopressors); cultures + for Fusobacterium Necrophorum   PT Comments   Pt progressing slowly. Pt able to stand x 2 trials but for only 5-15 sec at a time due to pain in bil feet and hands. Do not feel pt would be able to tolerate CIR at this time but if proceeding with bil UE and LE surgeries acutely would be appropriate for DC to CIR after surgeries given considerable functional decline from baseline. If surgeries are delayed until another admission than SNF would be more appropriate for current DC. Will continue to assess as surgeons proceed. Pt states family says they can care for him but none present to confirm and would require 2 person assist or hoyer for OOB at home. Will continue to follow and encouraged pt to perform HEP daily. Recommend maximove for nursing transfers OOB.   Follow Up Recommendations  SNF;CIR (pending surgery)     Does the patient have the potential to tolerate intense rehabilitation     Barriers to Discharge        Equipment Recommendations  Wheelchair (measurements PT);Wheelchair cushion (measurements PT)    Recommendations for Other Services    Frequency     Progress  towards PT Goals Progress towards PT goals: Progressing toward goals  Plan Discharge plan needs to be updated    Precautions / Restrictions Precautions Precautions: Fall Precaution Comments: unable to use hands for functional tasks Restrictions Other Position/Activity Restrictions: no bil LE restrictions per Dr.Brabham   Pertinent Vitals/Pain Pain 9/10 bil hands repositioned    Mobility  Bed Mobility Bed Mobility: Rolling Left;Left Sidelying to Sit Rolling Left: 3: Mod assist;With rail Left Sidelying to Sit: 3: Mod assist;HOB flat;With rails Sitting - Scoot to Edge of Bed: 4: Min assist Details for Bed Mobility Assistance: cueing for sequence with good use of right heel to assist with rolling and hooking arm to elevate trunk from surface,  Transfers Transfers: Sit to Stand;Stand to Sit;Stand Pivot Transfers Sit to Stand: 1: +2 Total assist;From bed;From elevated surface;From chair/3-in-1 Sit to Stand: Patient Percentage: 50% Stand to Sit: 1: +2 Total assist;To chair/3-in-1 Stand to Sit: Patient Percentage: 50% Stand Pivot Transfers: 1: +2 Total assist Stand Pivot Transfers: Patient Percentage: 50% Details for Transfer Assistance: Pt stood from elevated bed and chair with bil knees blocked and using pt forearms on PT and SPT forearms for supports into standing with assist for anterior translation with increased difficulty from low surface. Pt unable to fully control descent and needed assist to guide and advance pelvis to chair Ambulation/Gait Ambulation/Gait Assistance: Not tested (comment)    Exercises General Exercises - Lower Extremity Long Arc Quad: AROM;Seated;Both;10 reps Hip ABduction/ADduction: AROM;Seated;Both;10 reps Hip Flexion/Marching: AROM;Seated;Both;10 reps   PT Diagnosis:    PT Problem List:  PT Treatment Interventions:     PT Goals (current goals can now be found in the care plan section)    Visit Information  Last PT Received On: 08/25/13 Assistance  Needed: +2 History of Present Illness: 52 year old male presented to [facility] 10/28 with AMS/Sepsis in setting of cocaine /oxycodone use. Pt admitted with Respiratory Failure (ARDS)/Sepsis has developed acute renal failure, thrombocytopenia, a-fib Flutter with RVR and was transferred to [facility] on 10/31 for HD and further care. Pt with multisystem organ failure requiring dialysis; intubated until 11/09; had punch biopsy of skin lesion Rt antecubital space that later required I&D by ortho due to incr drainage; + DIC with septic pulmonary emboli and necrotizing of tips of all his digits (also due to use of vasopressors); cultures + for Fusobacterium Necrophorum    Subjective Data      Cognition  Cognition Arousal/Alertness: Awake/alert Behavior During Therapy: WFL for tasks assessed/performed Overall Cognitive Status: Impaired/Different from baseline Problem Solving: Slow processing    Balance  Static Sitting Balance Static Sitting - Balance Support: Feet supported;No upper extremity supported Static Sitting - Level of Assistance: 5: Stand by assistance Static Sitting - Comment/# of Minutes: 2  End of Session PT - End of Session Equipment Utilized During Treatment: Gait belt Activity Tolerance: Patient limited by pain Patient left: in chair;with call bell/phone within reach Nurse Communication: Mobility status;Need for lift equipment   GP     Delorse Lek 08/25/2013, 12:49 PM Delaney Meigs, PT [phone]

## 2013-08-26 LAB — GLUCOSE, CAPILLARY
Glucose-Capillary: 106 mg/dL — ABNORMAL HIGH (ref 70–99)
Glucose-Capillary: 124 mg/dL — ABNORMAL HIGH (ref 70–99)

## 2013-08-26 MED ORDER — CHLORHEXIDINE GLUCONATE 0.12 % MT SOLN
15.0000 mL | Freq: Two times a day (BID) | OROMUCOSAL | Status: DC
  Administered 2013-08-27 – 2013-09-07 (×22): 15 mL via OROMUCOSAL
  Filled 2013-08-26 (×24): qty 15

## 2013-08-26 NOTE — Progress Notes (Signed)
D/c'd wound vac. Wet to dry dressing placed over wound per orders. Pt tolerated well. Wound bed red, no order or drainage noted.

## 2013-08-26 NOTE — Progress Notes (Signed)
Pt requested to keep boots off for alittle longer. placed pillows under heals. Will reassess.

## 2013-08-26 NOTE — Progress Notes (Signed)
Pt requested to have provolon boos to be removed. Verbalized to pt the use of boots will reapply at 10am.

## 2013-08-26 NOTE — Progress Notes (Signed)
[facility] PROGRESS NOTE  Kee Drudge WUJ:[id number] DOB: 06-24-1961 DOA: 08/04/2013 PCP: No primary provider on file.  Brief Narrative: Pt is a 52 year old male seen initially at [facility] 10/28 with AMS/Sepsis in setting of cocaine /oxycodone use. He was admitted with Respiratory Failure (ARDS)/Sepsis and developed acute renal failure, thrombocytopenia, a-fib Flutter with RVR and was transferred to [facility] service here at [facility] on 10/31 for HD and further care.  SIGNIFICANT EVENTS / STUDIES:  10/28 Admission to [facility]  10/29 Metabolic Acidosis, pH=6.9  10/31 Transferred to [facility] for HD.  10/31 Cardioversion for Afib/Flutter rate 170's ( Unsuccessful). Amiodarone initiated for Afib/Flutter RVR  10/31 Renal consult: CRRT initiated  10/31 Heme consult: TCP likely due to DIC. Doubt TTP/HUS  11/01 Echocardiogram: LVEF 20-25%. Diffuse HK  11/01 ID consult  11/01 Skin biopsy: thrombotic vasculopathic reaction  11/2 tolerated HD, off pressors. Platelet transfusion for thrombocytopenia  11/3 CXR with new pulmonary opacities/nodules. Concern for septic emboli.  11/03 CT chest: Multilobar bilateral areas of pulmonary parenchymal nodular consolidation with internal necrosis/ cavitation and suggestion of surrounding hazy opacity. The appearance is most typical for septic emboli  11/03 CT head: NAD  11/03 BCx from [place] 2/2 + for FUSOBACTERIUM NECROPHORUM.  11/04 CT neck (noncontrasted): No evidence of jugular vein thrombosis, abscesses, inflammation.  11/05 Jugular vein [place]: No thrombus noted  11/04 Converted to NSR from Afib/Aflutter  11/05 Dexmedetomidine initiated in anticipation of extubation soon.  11/06 C Diff negative, Tracheal aspirate few yeast  11/06 Continued fevers, restart vanc for possible MRSA PNA  11/07 Sinus Tract R antecubital arm at site of previous punch bx discovered  11/08 MRI R forearm/elbow/humerus: inadequate study due to inability to cooperate  11/09  Debridement and lavage of RUE wound (Coley). No joint space infxn  11/09 Hypotension > norepi. Acute blood loss anemia (probably post op) > 2 units RBCs. Weaned off norepi after RBCs  11/09 Vasc surg consult for digital gangrene. 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 - Apparently hand surgery evaluated patient yesterday. No word on timing of surgery. No notes in Epic.  - per vascular no evidence of ascending cellulitis; no immediate surgical intervention needed - Dr. Myra Gianotti would like to time any possible foot surgeries with any hand surgeries to avoid additional anesthetic risks. Dr. Myra Gianotti saw again patient 11/20, to get in touch with hand and coordinate surgery. If surgery early next week, patient will stay in the hospital - Dr. Stevphen Meuse talked with Dr. Myra Gianotti 11/18 and he states he is awaiting Dr Louanna Raw surgeons input on timing of surgery>> Dr. Donna Bernard called Dr Debby Bud office 11/18 and he is to see pt and a make further recs on remmendations and timing for management of gangrenous fingers. I called hand surgery office 11/20 again. 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  - Patient's blood cultures from [place] grew Fusobacterium Necrophorum - he was placed on antibiotics with cefepime and vancomycin (had been on Rocephin/Levaquin/Vanc at [place]) - ID was consulted and has been following patient for antibiotic recommendations - He also required vasopressors and as he improved clinically and blood pressure stabilized this were discontinued -  WBC still  up today, afebrile>> ID recs as per 11/17 note>> will change to invanz, and CVL is to be  removed as it was put in while pt was bacteremic>> IR placed new tunnelled line 11/19, old CVL removed - needs 4 weeks of IV Invanz then Augmentin until CXR cavitary changes resolve Multifocal cavitary pneumonia /Acute respiratory failure/ARDS - Patient was on the vent through11/10 and he was extubated, respiratory status remained stable. Abx as above. Thrombocytopenia with coagulopathy - secondary to Sepsis and DIC - As treated with antibiotics as discussed above, hematology was consulted and followed - improving Anemia/Heme + stool - Per GI his anemia is multifactorial and endoscopic evaluation would not change his clinical outcome at this time. - hgb stable, and no gross bleeding - continue to monitor hgb and transfuse as appropriate  a-fib Flutter with RVR  - S/P10/31 Cardioversion for Afib/Flutter rate 170's ( Unsuccessful).  - also was treated with Amiodarone  for Afib/Flutter RVR no dc'ed - rate remained controlled on current metoprolol TID Cardiomyopathy-Echocardiogram: LVEF 20-25%. - With increased peripheral edema noted, patient was followed an volume managed with dialysis per renal through 11/9 - Not on diuretics as this has been limited by Hypotension(now resolved) and poor renal function-improving but Cr still elevated>>see as discussed below Acute renal failure  -CRRT started 10/31 transitioned to intermittent dialysis Last dialysis 11/9. 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  - Treated with meropenem and Vancomycin,Vanc DC'd 11/13, then meropenem, narrowed to Invanz 11/17 Rash/Thrombotic vasculopathic reaction -s/p Skin biopsy per derm on 11/1; thrombotic vasculopathic reaction  Diarrhea -C Diff negative11/06  - flexiseal now with decreased stool output >>will DC Hypokalemia  Resolved, follow cocaine /oxycodone  use Hepatitis C Lemierre syndrome  Code Status: partial Family Communication: none at bedside  Disposition Plan: pending hand/vascular surgical timing  Consultants:  GI  Vascular  ID  Renal  Heme/onc  Dermatology  cardiololgy  Procedures: LINES / TUBES:  R IJ CVL 10/28 >>>   ETT 10/29 >> 11/10  10/31 Cardioversion for Afib/Flutter rate 170's ( Unsuccessful) R femoral HD Cath 11/01 >>>11/12 11/01 Echocardiogram: LVEF 20-25%. Diffuse HK 11/01 Skin biopsy: thrombotic vasculopathic reaction  11/09 Debridement and lavage of RUE wound (Coley). No joint space infxn  11/10 Extubated, tolerated well  CULTURES:  [facility] >> + Fusobacterium Necrophorum  BCx2 10/31>>> Negative  Sputum 10/31>> Few yeast  Tracheal Asp. 11/05>> Rare WBC, no organisms  C Diff 11/5 >> Negative  RUE wound 11/7 >> Negative   Antibiotics: Rocephin: 10/28?>>>10/31  Levaquin: 10/28>>>10/31  Vancomycin 10/28>>> 11/3  Cefepime 10/31>>> 11/3  Flagyl 11/2 >> 11/3  Meropenem 11/3 >> 11/17 Vancomycin 11/6 >> 11/13 Invanz 11/17 >>    HPI/Subjective: - no new complaints.   Objective: Filed Vitals:   08/26/13 0446  BP: 130/73  Pulse: 79  Temp: 98.1 F (36.7 C)  Resp: 18    Intake/Output Summary (Last 24 hours) at 08/26/13 1001 Last data filed at 08/26/13 0700  Gross per 24 hour  Intake    940 ml  Output   3000 ml  Net  -2060 ml   Filed Weights   08/24/13 0508 08/25/13 0602 08/26/13 0446  Weight: 85.6 kg (188 lb 11.4 oz) 84.324 kg (185 lb 14.4 oz) 84.6 kg (186 lb 8.2 oz)   Exam:  General: alert & oriented x 3  In NAD Cardiovascular: RRR nl S1 s2 Respiratory: Decreased air entry bil, no crackles or wheezes Abdomen: soft +BS NT/ND, no masses palpable Extremities: +2 edema, dry gangrene of toes bilat. And fingers L>R dresssing over RUE wound clean and dry  Data Reviewed: Basic Metabolic Panel:  Recent Labs Lab 08/21/13 0615 08/22/13 0813 08/23/13 0450  08/24/13 0535 08/25/13 0500  NA 140 138 138 137 134*  K 4.3 4.3 4.8 4.6 4.5  CL 107 105 105 102 101  CO2 23 25 24 25 25   GLUCOSE 95 101* 92 93 89  BUN 70* 63* 62* 59* 56*  CREATININE 2.80* 2.91* 2.74* 2.72* 2.80*  CALCIUM 7.2* 7.6* 7.8* 7.8* 8.0*   CBC:  Recent Labs Lab 08/21/13 0615 08/22/13 0813 08/23/13 0450 08/24/13 0535 08/25/13 0500  WBC 19.7* 20.2* 18.1* 15.6* 14.2*  HGB 8.5* 8.6* 8.6* 8.5* 8.3*  HCT 25.8* 25.5* 26.3* 25.9* 24.8*  MCV 93.1 92.1 92.9 92.8 92.9  PLT 49* 63* 85* 94* 109*    CBG:  Recent Labs Lab 08/24/13 1622 08/24/13 2313 08/25/13 0555 08/25/13 1100 08/25/13 1643  GLUCAP 85 117* 94 109* 100*    Recent Results (from the past 240 hour(s))  CLOSTRIDIUM DIFFICILE BY PCR     Status: None   Collection Time    08/16/13  5:26 PM      Result Value Range Status   C difficile by pcr NEGATIVE  NEGATIVE Final     Studies: No results found.  Scheduled Meds: . antiseptic oral rinse  15 mL Mouth Rinse BID  . ertapenem  1 g Intravenous Daily  . feeding supplement (ENSURE COMPLETE)  237 mL Oral BID BM  . metoprolol tartrate  25 mg Oral TID  . 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 If 7PM-7AM, please contact night-coverage at [email or web address], password [facility] 08/26/2013, 10:01 AM  LOS: 22 days

## 2013-08-27 LAB — GLUCOSE, CAPILLARY

## 2013-08-27 MED ORDER — ZOLPIDEM TARTRATE 5 MG PO TABS
5.0000 mg | ORAL_TABLET | Freq: Once | ORAL | Status: AC
  Administered 2013-08-27: 01:00:00 5 mg via ORAL
  Filled 2013-08-27: qty 1

## 2013-08-27 MED ORDER — TRAZODONE HCL 50 MG PO TABS
50.0000 mg | ORAL_TABLET | Freq: Every evening | ORAL | Status: DC | PRN
  Administered 2013-08-27 – 2013-09-06 (×6): 50 mg via ORAL
  Filled 2013-08-27 (×6): qty 1

## 2013-08-27 NOTE — Progress Notes (Signed)
[facility] PROGRESS NOTE  Marlan Steward ZOX:[id number] DOB: Nov 25, 1960 DOA: 08/04/2013 PCP: No primary provider on file.  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 - Apparently hand surgery evaluated patient 11/21. No word on timing of surgery. No notes in Epic. Calls to office without answer over the weekend. Will try again on Monday. Continue supportive treatment until then. If no surgery this week, SNF discharge.  - per vascular no evidence of ascending cellulitis; no immediate surgical intervention needed. Afebrile but on Invanz. - Dr. Myra Gianotti would like to time any possible foot surgeries with any hand surgeries to avoid additional anesthetic risks.  - Dr. Stevphen Meuse talked with Dr. Myra Gianotti 11/18 and he states he is awaiting Dr Louanna Raw surgeons input on timing of surgery>> Dr. Donna Bernard called Dr Debby Bud office 11/18 and he is to see pt and a make further recs on remmendations and timing for management of gangrenous fingers. I called hand surgery office 11/20 and 11/22 again. 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  - Patient's blood cultures from [place] grew Fusobacterium Necrophorum - he was placed on antibiotics with cefepime and vancomycin (had been on Rocephin/Levaquin/Vanc at [place]) - ID was consulted and has been following patient for antibiotic recommendations - He also required vasopressors and as he improved clinically and blood pressure stabilized this were discontinued - WBC still  up today, afebrile>> ID recs as per 11/17 note>> will change to invanz, and CVL is to be removed as it was put in while pt was bacteremic>> IR placed new tunnelled line 11/19, old CVL removed - needs 4 weeks of IV Invanz then Augmentin until CXR cavitary changes resolve Multifocal cavitary pneumonia /Acute respiratory failure/ARDS - Patient was on the vent through11/10 and he was extubated, respiratory status remained stable. Abx as above. Thrombocytopenia  with coagulopathy - secondary to Sepsis and DIC - As treated with antibiotics as discussed above, hematology was consulted and followed - improving Anemia/Heme + stool - Per GI his anemia is multifactorial and endoscopic evaluation would not change his clinical outcome at this time. - hgb stable, and no gross bleeding - continue to monitor hgb and transfuse as appropriate  a-fib Flutter with RVR  - S/P10/31 Cardioversion for Afib/Flutter rate 170's ( Unsuccessful).  - also was treated with Amiodarone  for Afib/Flutter RVR no dc'ed - rate remained controlled on current metoprolol TID Cardiomyopathy-Echocardiogram: LVEF 20-25%. - With increased peripheral edema noted, patient was followed an volume managed with dialysis per renal through 11/9 - Not on diuretics as this has been limited by Hypotension(now resolved) and poor renal function-improving but Cr still elevated>>see as discussed below Acute renal failure  -CRRT started 10/31 transitioned to intermittent dialysis Last dialysis 11/9. 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 He was admitted with Respiratory Failure (ARDS)/Sepsis and developed acute renal failure, thrombocytopenia, a-fib Flutter with RVR and was transferred to [facility] service here at [facility] on 10/31  for HD and further care.   SIGNIFICANT EVENTS / STUDIES:  10/28 Admission to [facility]  10/29 Metabolic Acidosis, pH=6.9  10/31 Transferred to [facility] for HD.  10/31 Cardioversion for Afib/Flutter rate 170's ( Unsuccessful). Amiodarone initiated for Afib/Flutter RVR  10/31 Renal consult: CRRT initiated  10/31 Heme consult: TCP likely due to DIC. Doubt TTP/HUS  11/01 Echocardiogram: LVEF 20-25%. Diffuse HK  11/01 ID consult  11/01 Skin biopsy: thrombotic vasculopathic reaction  11/2 tolerated HD, off pressors. Platelet transfusion for thrombocytopenia  11/3 CXR with new pulmonary opacities/nodules. Concern for septic emboli.  11/03 CT chest: Multilobar bilateral areas of pulmonary parenchymal nodular consolidation with internal necrosis/ cavitation and suggestion of surrounding hazy opacity. The appearance is most typical for septic emboli  11/03 CT head: NAD  11/03 BCx from [place] 2/2 + for FUSOBACTERIUM NECROPHORUM.  11/04 CT neck (noncontrasted): No evidence of jugular vein thrombosis, abscesses, inflammation.  11/05 Jugular vein [place]: No thrombus noted  11/04 Converted to NSR from Afib/Aflutter  11/05 Dexmedetomidine initiated in anticipation of extubation soon.  11/06 C Diff negative, Tracheal aspirate few yeast  11/06 Continued fevers, restart vanc for possible MRSA PNA  11/07 Sinus Tract R antecubital arm at site of previous punch bx discovered  11/08 MRI R forearm/elbow/humerus: inadequate study due to inability to cooperate  11/09 Debridement and lavage of RUE wound (Coley). No joint space infxn  11/09 Hypotension > norepi. Acute blood loss anemia (probably post op) > 2 units RBCs. Weaned off norepi after RBCs  11/09 Vasc surg consult for digital gangrene. No acute intervention, allow demarcation, possible amputation in future  11/10 Extubated, tolerated well  11/10 Pt in PAF despite amiodarone  11/11 1 u pRBC transfused  11/12 NSR, restart Metoprolol  11/13 FOBT +, Hgb to 6.6  11/14 Transfused 2 u  pRBCs  11/14 GI consult for possible UGI vs LGI bleed      Code Status: partial Family Communication: none at bedside  Disposition Plan: pending hand/vascular surgical timing  Consultants:  GI  Vascular  ID  Renal  Heme/onc  Dermatology  cardiololgy  Procedures: LINES / TUBES:  R IJ CVL 10/28 >>>   ETT 10/29 >> 11/10  10/31 Cardioversion for Afib/Flutter rate 170's ( Unsuccessful) R femoral HD Cath 11/01 >>>11/12 11/01 Echocardiogram: LVEF 20-25%. Diffuse HK 11/01 Skin biopsy: thrombotic vasculopathic reaction  11/09 Debridement and lavage of RUE wound (Coley). No joint space infxn  11/10 Extubated, tolerated well  CULTURES:  [facility] >> + Fusobacterium Necrophorum  BCx2 10/31>>> Negative  Sputum 10/31>> Few yeast  Tracheal Asp. 11/05>> Rare WBC, no organisms  C Diff 11/5 >> Negative  RUE wound 11/7 >> Negative   Antibiotics: Rocephin: 10/28?>>>10/31  Levaquin: 10/28>>>10/31  Vancomycin 10/28>>> 11/3  Cefepime 10/31>>> 11/3  Flagyl 11/2 >> 11/3  Meropenem 11/3 >> 11/17 Vancomycin 11/6 >> 11/13 Invanz 11/17 >>    HPI/Subjective: - no new complaints.   Objective: Filed Vitals:   08/27/13 0531  BP: 100/60  Pulse: 82  Temp: 98.6 F (37 C)  Resp: 18    Intake/Output Summary (Last 24 hours) at 08/27/13 0906 Last data filed at 08/27/13 0654  Gross per 24 hour  Intake   1620 ml  Output   4925 ml  Net  -3305 ml   Filed Weights   08/25/13 0602 08/26/13 0446 08/27/13 0531  Weight: 84.324 kg (185 lb 14.4 oz) 84.6 kg (186 lb 8.2 oz) 80.4 kg (177 lb 4 oz)   Exam:  General: alert & oriented x 3 In NAD Cardiovascular: RRR nl S1 s2 Respiratory: Decreased air entry bil, no crackles or wheezes Abdomen: soft +BS NT/ND, no masses palpable Extremities: +2 edema, dry gangrene of toes bilat. And fingers L>R dresssing over RUE wound clean and dry  Data Reviewed: Basic Metabolic Panel:  Recent Labs Lab 08/21/13 0615 08/22/13 0813  08/23/13 0450 08/24/13 0535 08/25/13 0500  NA 140 138 138 137 134*  K 4.3 4.3 4.8 4.6 4.5  CL 107 105 105 102 101  CO2 23 25 24 25 25   GLUCOSE 95 101* 92 93 89  BUN 70* 63* 62* 59* 56*  CREATININE 2.80* 2.91* 2.74* 2.72* 2.80*  CALCIUM 7.2* 7.6* 7.8* 7.8* 8.0*   CBC:  Recent Labs Lab 08/21/13 0615 08/22/13 0813 08/23/13 0450 08/24/13 0535 08/25/13 0500  WBC 19.7* 20.2* 18.1* 15.6* 14.2*  HGB 8.5* 8.6* 8.6* 8.5* 8.3*  HCT 25.8* 25.5* 26.3* 25.9* 24.8*  MCV 93.1 92.1 92.9 92.8 92.9  PLT 49* 63* 85* 94* 109*    CBG:  Recent Labs Lab 08/25/13 1643 08/26/13 1205 08/26/13 1818 08/27/13 0023 08/27/13 0559  GLUCAP 100* 106* 124* 96 89    No results found for this or any previous visit (from the past 240 hour(s)).   Studies: No results found.  Scheduled Meds: . chlorhexidine  15 mL Mouth/Throat BID  . ertapenem  1 g Intravenous Daily  . feeding supplement (ENSURE COMPLETE)  237 mL Oral BID BM  . metoprolol tartrate  25 mg Oral TID  . 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 If 7PM-7AM, please contact night-coverage at [email or web address], password [facility] 08/27/2013, 9:06 AM  LOS: 23 days

## 2013-08-27 NOTE — Progress Notes (Signed)
Pt requesting something to help sleep tonight, md notified new orders placed.

## 2013-08-27 NOTE — Progress Notes (Addendum)
Pt placed in the chair 2 person max assist. Call bell within reach, family at the bedside.

## 2013-08-27 NOTE — Progress Notes (Signed)
Pt returned to bed, 2 person max assist. Call bell with in reach. No complaints.

## 2013-08-27 NOTE — Progress Notes (Signed)
Patient having c/o continuous and ongoing pain to bilateral hands. PRN Oxy administered as ordered.  Upon re-assessment, patient stated that the pain is gone from the right hand but remains on the left.  He stated that the pain to his left hand never goes away and did not want any additional medications for pain at this time.  Repositioned arm and informed patient to call if the pain increases or changes.  Will continue to monitor. Troy Sine

## 2013-08-28 LAB — BASIC METABOLIC PANEL
BUN: 57 mg/dL — ABNORMAL HIGH (ref 6–23)
CO2: 29 mEq/L (ref 19–32)
Calcium: 9.2 mg/dL (ref 8.4–10.5)
Creatinine, Ser: 3.25 mg/dL — ABNORMAL HIGH (ref 0.50–1.35)
GFR calc Af Amer: 24 mL/min — ABNORMAL LOW (ref >90)
GFR calc non Af Amer: 20 mL/min — ABNORMAL LOW (ref >90)
Glucose, Bld: 93 mg/dL (ref 70–99)
Sodium: 135 mEq/L (ref 135–145)

## 2013-08-28 LAB — CBC
HCT: 24.8 % — ABNORMAL LOW (ref 39.0–52.0)
Hemoglobin: 8.1 g/dL — ABNORMAL LOW (ref 13.0–17.0)
MCH: 30.7 pg (ref 26.0–34.0)
MCHC: 32.7 g/dL (ref 30.0–36.0)
MCV: 93.9 fL (ref 78.0–100.0)
Platelets: 227 10*3/uL (ref 150–400)
RBC: 2.64 MIL/uL — ABNORMAL LOW (ref 4.22–5.81)
RDW: 15.8 % — ABNORMAL HIGH (ref 11.5–15.5)

## 2013-08-28 MED ORDER — SODIUM CHLORIDE 0.9 % IV BOLUS (SEPSIS)
500.0000 mL | Freq: Once | INTRAVENOUS | Status: AC
  Administered 2013-08-28: 11:00:00 500 mL via INTRAVENOUS

## 2013-08-28 NOTE — Progress Notes (Signed)
Subjective  -   No new complaints Pain when trying to ambulate No Cehst pain No SOB   Physical Exam:  Palpable DP bilaterally Dry gangreene to B LE, R>L No s/sx infection Pulm CTA       Assessment/Plan:    Will plan for surgery this Wednesday:  Right Trans metatarsal amputation, and left digital amputation x 5 D/w patient options of observation vs amputation.  He would like to precede with surgery and get things done.  Will try to coordinate OR with hand surgery, but this may not be possible  PLT count improved   Lavette Yankovich IV, V. WELLS 08/28/2013 7:41 AM --  Filed Vitals:   08/28/13 0621  BP: 134/75  Pulse: 79  Temp: 98.2 F (36.8 C)  Resp: 17    Intake/Output Summary (Last 24 hours) at 08/28/13 0741 Last data filed at 08/28/13 0318  Gross per 24 hour  Intake   1680 ml  Output   4750 ml  Net  -3070 ml     Laboratory CBC    Component Value Date/Time   WBC 19.5* 08/28/2013 0455   HGB 8.1* 08/28/2013 0455   HCT 24.8* 08/28/2013 0455   PLT 227 08/28/2013 0455    BMET    Component Value Date/Time   NA 135 08/28/2013 0455   K 4.0 08/28/2013 0455   CL 96 08/28/2013 0455   CO2 29 08/28/2013 0455   GLUCOSE 93 08/28/2013 0455   BUN 57* 08/28/2013 0455   CREATININE 3.25* 08/28/2013 0455   CALCIUM 9.2 08/28/2013 0455   GFRNONAA 20* 08/28/2013 0455   GFRAA 24* 08/28/2013 0455    COAG Lab Results  Component Value Date   INR 1.19 08/14/2013   INR 1.36 08/08/2013   INR 1.55* 08/06/2013   No results found for this basename: PTT    Antibiotics Anti-infectives   Start     Dose/Rate Route Frequency Ordered Stop   08/23/13 1000  ertapenem (INVANZ) 1 g in sodium chloride 0.9 % 50 mL IVPB  Status:  Discontinued     1 g 100 mL/hr over 30 Minutes Intravenous Every 24 hours 08/22/13 1017 08/22/13 1034   08/22/13 1200  ertapenem (INVANZ) 1 g in sodium chloride 0.9 % 50 mL IVPB  Status:  Discontinued     1 g 100 mL/hr over 30 Minutes Intravenous  Every 24 hours 08/22/13 1034 08/22/13 1047   08/22/13 1100  ertapenem (INVANZ) 1 g in sodium chloride 0.9 % 50 mL IVPB     1 g 100 mL/hr over 30 Minutes Intravenous Daily 08/22/13 1047     08/15/13 0800  vancomycin (VANCOCIN) IVPB 1000 mg/200 mL premix  Status:  Discontinued     1,000 mg 200 mL/hr over 60 Minutes Intravenous Every 24 hours 08/15/13 0744 08/17/13 0931   08/15/13 0800  meropenem (MERREM) 500 mg in sodium chloride 0.9 % 50 mL IVPB  Status:  Discontinued     500 mg 100 mL/hr over 30 Minutes Intravenous Every 12 hours 08/15/13 0749 08/22/13 1017   08/12/13 1637  polymyxin B 500,000 Units, bacitracin 50,000 Units in sodium chloride irrigation 0.9 % 500 mL irrigation  Status:  Discontinued       As needed 08/12/13 1638 08/12/13 1703   08/12/13 1300  vancomycin (VANCOCIN) IVPB 1000 mg/200 mL premix     1,000 mg 200 mL/hr over 60 Minutes Intravenous  Once 08/12/13 1147 08/12/13 1441   08/11/13 1830  meropenem (MERREM) 1 g in sodium  chloride 0.9 % 100 mL IVPB  Status:  Discontinued     1 g 200 mL/hr over 30 Minutes Intravenous Every 24 hours 08/11/13 1145 08/15/13 0749   08/11/13 1100  vancomycin (VANCOCIN) IVPB 750 mg/150 ml premix     750 mg 150 mL/hr over 60 Minutes Intravenous  Once 08/11/13 1026 08/11/13 1321   08/10/13 1745  vancomycin (VANCOCIN) IVPB 750 mg/150 ml premix     750 mg 150 mL/hr over 60 Minutes Intravenous  Once 08/10/13 1734 08/10/13 1920   08/07/13 1830  meropenem (MERREM) 500 mg in sodium chloride 0.9 % 50 mL IVPB  Status:  Discontinued     500 mg 100 mL/hr over 30 Minutes Intravenous Every 24 hours 08/07/13 1745 08/11/13 1145   08/06/13 2000  vancomycin (VANCOCIN) IVPB 750 mg/150 ml premix     750 mg 150 mL/hr over 60 Minutes Intravenous  Once 08/06/13 1056 08/07/13 0330   08/05/13 2200  ceFEPIme (MAXIPIME) 1 g in dextrose 5 % 50 mL IVPB  Status:  Discontinued     1 g 100 mL/hr over 30 Minutes Intravenous Every 24 hours 08/04/13 1815 08/07/13 1656    08/05/13 1200  metroNIDAZOLE (FLAGYL) IVPB 500 mg  Status:  Discontinued     500 mg 100 mL/hr over 60 Minutes Intravenous Every 8 hours 08/05/13 1049 08/07/13 1656   08/04/13 1630  ceFEPIme (MAXIPIME) 2 g in dextrose 5 % 50 mL IVPB     2 g 100 mL/hr over 30 Minutes Intravenous  Once 08/04/13 1604 08/04/13 1747       V. Charlena Cross, M.D. [facility] Office: [phone] Pager:  [phone]

## 2013-08-28 NOTE — Progress Notes (Signed)
[facility] PROGRESS NOTE  Timothy Kline ZOX:[id number] DOB: Feb 18, 1961 DOA: 08/04/2013 PCP: No primary provider on file.  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 - plan for OR this Wed per vascular, appreciate input. Not sure if will be able to coordinate with hand also. No signs of infection. Acute renal failure  - CRRT started 10/31 transitioned to intermittent dialysis Last dialysis 11/9. - Renal followed and signed off on 11/12 - kidney function worse this morning, small fluid challenge today. If continues to deteriorate will re-consult. Good UOP. 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  - Patient's blood cultures from [place] grew Fusobacterium Necrophorum - he was placed on antibiotics with cefepime and vancomycin (had been on Rocephin/Levaquin/Vanc at [place]) - ID was consulted and has been following patient for antibiotic recommendations - He also required vasopressors and as he improved clinically and blood pressure stabilized this were discontinued - WBC still up today, afebrile>> ID recs as per 11/17 note>> will change to invanz, and CVL is to be removed as it was put in while pt was bacteremic>> IR placed new tunnelled line 11/19, old CVL removed - needs 4 weeks of IV Invanz then Augmentin until CXR cavitary changes resolve Multifocal cavitary pneumonia /Acute respiratory failure/ARDS - Patient was on the vent through11/10 and he was extubated, respiratory status remained stable. Abx as above. Thrombocytopenia with coagulopathy - secondary to Sepsis and DIC - As treated with antibiotics as discussed above, hematology was consulted and followed - improving, platelets now 227 Anemia/Heme + stool - Per GI his anemia is multifactorial and endoscopic evaluation would not change his clinical outcome at this time. - hgb stable, and no gross bleeding - continue to monitor hgb and transfuse as appropriate  a-fib Flutter with RVR  -  S/P10/31 Cardioversion for Afib/Flutter rate 170's ( Unsuccessful).  - also was treated with Amiodarone  for Afib/Flutter RVR no dc'ed - rate remained controlled on current metoprolol TID Cardiomyopathy-Echocardiogram: LVEF 20-25%. 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 He was admitted with Respiratory Failure (ARDS)/Sepsis and developed acute renal failure, thrombocytopenia, a-fib Flutter with RVR and was transferred to [facility] service here at [facility] on 10/31 for HD and further care.  SIGNIFICANT EVENTS / STUDIES:  10/28 Admission to [facility]  10/29 Metabolic Acidosis, pH=6.9  10/31 Transferred to [facility] for HD.  10/31 Cardioversion for Afib/Flutter rate 170's ( Unsuccessful). Amiodarone initiated for Afib/Flutter RVR  10/31 Renal consult: CRRT initiated  10/31 Heme consult: TCP likely due to DIC. Doubt TTP/HUS  11/01 Echocardiogram: LVEF 20-25%. Diffuse HK  11/01 ID consult  11/01 Skin biopsy: thrombotic vasculopathic reaction  11/2 tolerated HD, off pressors. Platelet transfusion for thrombocytopenia  11/3 CXR with new pulmonary opacities/nodules.  Concern for  septic emboli.  11/03 CT chest: Multilobar bilateral areas of pulmonary parenchymal nodular consolidation with internal necrosis/ cavitation and suggestion of surrounding hazy opacity. The appearance is most typical for septic emboli  11/03 CT head: NAD  11/03 BCx from [place] 2/2 + for FUSOBACTERIUM NECROPHORUM.  11/04 CT neck (noncontrasted): No evidence of jugular vein thrombosis, abscesses, inflammation.  11/05 Jugular vein [place]: No thrombus noted  11/04 Converted to NSR from Afib/Aflutter  11/05 Dexmedetomidine initiated in anticipation of extubation soon.  11/06 C Diff negative, Tracheal aspirate few yeast  11/06 Continued fevers, restart vanc for possible MRSA PNA  11/07 Sinus Tract R antecubital arm at site of previous punch bx discovered  11/08 MRI R forearm/elbow/humerus: inadequate study due to inability to cooperate  11/09 Debridement and lavage of RUE wound (Coley). No joint space infxn  11/09 Hypotension > norepi. Acute blood loss anemia (probably post op) > 2 units RBCs. Weaned off norepi after RBCs  11/09 Vasc surg consult for digital gangrene. No acute intervention, allow demarcation, possible amputation in future  11/10 Extubated, tolerated well  11/10 Pt in PAF despite amiodarone  11/11 1 u pRBC transfused  11/12 NSR, restart Metoprolol  11/13 FOBT +, Hgb to 6.6  11/14 Transfused 2 u pRBCs  11/14 GI consult for possible UGI vs LGI bleed   Code Status: partial Family Communication: none at bedside  Disposition Plan: pending hand/vascular surgical timing  Consultants:  GI - s/o  Vascular  ID - s/o  Renal - s/o  Heme/onc - s/o  Dermatology - s/o  cardiololgy - s/o  Procedures: LINES / TUBES:  R IJ CVL 10/28 >>>   ETT 10/29 >> 11/10  10/31 Cardioversion for Afib/Flutter rate 170's ( Unsuccessful) R femoral HD Cath 11/01 >>>11/12 11/01 Echocardiogram: LVEF 20-25%. Diffuse HK 11/01 Skin biopsy: thrombotic vasculopathic reaction  11/09  Debridement and lavage of RUE wound (Coley). No joint space infxn  11/10 Extubated, tolerated well  CULTURES:  [facility] >> + Fusobacterium Necrophorum  BCx2 10/31>>> Negative  Sputum 10/31>> Few yeast  Tracheal Asp. 11/05>> Rare WBC, no organisms  C Diff 11/5 >> Negative  RUE wound 11/7 >> Negative   Antibiotics: Rocephin: 10/28?>>>10/31  Levaquin: 10/28>>>10/31  Vancomycin 10/28>>> 11/3  Cefepime 10/31>>> 11/3  Flagyl 11/2 >> 11/3  Meropenem 11/3 >> 11/17 Vancomycin 11/6 >> 11/13 Invanz 11/17 >>   HPI/Subjective: - no new complaints.   Objective: Filed Vitals:   08/28/13 0621  BP: 134/75  Pulse: 79  Temp: 98.2 F (36.8 C)  Resp: 17    Intake/Output Summary (Last 24 hours) at 08/28/13 0920 Last data filed at 08/28/13 8295  Gross per 24 hour  Intake   1920 ml  Output   5250 ml  Net  -3330 ml   Filed Weights   08/26/13 0446 08/27/13 0531 08/28/13 0621  Weight: 84.6 kg (186 lb 8.2 oz) 80.4 kg (177 lb 4 oz) 78.654 kg (173 lb 6.4 oz)   Exam:  General: alert & oriented x 3 In NAD Cardiovascular: RRR nl S1 s2 Respiratory: Decreased air entry bil, no crackles or wheezes Abdomen: soft +BS NT/ND, no masses palpable Extremities: +2 edema, dry gangrene of toes bilat. And fingers L>R dresssing over RUE wound clean and dry  Data Reviewed: Basic Metabolic Panel:  Recent Labs Lab 08/22/13 0813 08/23/13 0450 08/24/13 0535 08/25/13 0500 08/28/13 0455  NA 138 138 137 134* 135  K 4.3 4.8 4.6 4.5 4.0  CL 105 105 102 101 96  CO2 25 24 25 25 29   GLUCOSE 101* 92 93 89 93  BUN 63* 62* 59* 56* 57*  CREATININE 2.91* 2.74* 2.72* 2.80* 3.25*  CALCIUM 7.6* 7.8* 7.8* 8.0* 9.2   CBC:  Recent Labs Lab 08/22/13 0813 08/23/13 0450 08/24/13 0535 08/25/13 0500 08/28/13 0455  WBC 20.2* 18.1* 15.6* 14.2* 19.5*  HGB 8.6* 8.6* 8.5* 8.3* 8.1*  HCT 25.5* 26.3* 25.9* 24.8* 24.8*  MCV 92.1 92.9 92.8 92.9 93.9  PLT 63* 85* 94* 109* 227    CBG:  Recent  Labs Lab 08/26/13 1205 08/26/13 1818 08/27/13 0023 08/27/13 0559 08/27/13 1143  GLUCAP 106* 124* 96 89 113*    No results found for this or any previous visit (from the past 240 hour(s)).   Studies: No results found.  Scheduled Meds: . chlorhexidine  15 mL Mouth/Throat BID  . ertapenem  1 g Intravenous Daily  . feeding supplement (ENSURE COMPLETE)  237 mL Oral BID BM  . metoprolol tartrate  25 mg Oral TID  . 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 If 7PM-7AM, please contact night-coverage at [email or web address], password [facility] 08/28/2013, 9:20 AM  LOS: 24 days

## 2013-08-28 NOTE — Progress Notes (Signed)
PRN sleep aid administered tonight, as per patient's request.  Patient resting comfortably and denies any discomfort. Will continue to monitor. Timothy Kline

## 2013-08-28 NOTE — Progress Notes (Signed)
NUTRITION FOLLOW UP  Intervention:   Ensure Complete PO BID, each supplement provides 350 kcal and 13 grams of protein. Encouraged continued good intake of meals and supplements in preparation for surgery.  RD to continue to follow nutrition care plan.  Nutrition Dx: Increased nutrient needs r/t wound healing AEB estimated needs.  Goal: Intake to meet >90% of estimated nutrition needs. Improving.  Monitor:   PO intake, labs, weight trend.  Assessment:   PMHx significant for substance abuse and Hep C. Admitted to [facility] on 10/28 with AMS/sepsis in setting of cocaine and oxycodone use. Pt admitted to [facility] on 10/31 with septic shock from Fusobacterium bacteremia and with multifocal cavitary pneumonia, also with a fistulating purulent soft tissue infection +/- deep infection of the arm.  GI following for anemia and heme positive stool. Pt getting blood transfusions as needed.  Continues with dry gangrene in R fingers and bilateral toes - pt for OR this WED per vascular.  Diet-  dysphagia 2 with thin liquids. Pt reports that he is eating well and drinking his Ensure as scheduled. Meal intake is documented at 100%.  Height: Ht Readings from Last 1 Encounters:  08/20/13 5\' 6"  (1.676 m)   Weight Status:   Wt Readings from Last 1 Encounters:  08/28/13 173 lb 6.4 oz (78.654 kg)  08/11/13  213 lb 13.5 oz (97 kg)  08/05/13  235 lb 0.2 oz (106.6 kg)   Re-estimated needs:  Kcal: 2200-2400 Protein: 120-140 gm Fluid: 2.2-2.4 L  Skin:  skin breakdown on feet, heels, arms, and hands Arm incision Wound VAC to arm  Diet Order: Dysphagia 1 with thin liquids    Intake/Output Summary (Last 24 hours) at 08/28/13 1241 Last data filed at 08/28/13 1109  Gross per 24 hour  Intake   1500 ml  Output   5100 ml  Net  -3600 ml    Last BM: 11/17   Labs:   Recent Labs Lab 08/24/13 0535 08/25/13 0500 08/28/13 0455  NA 137 134* 135  K 4.6 4.5 4.0  CL 102 101 96  CO2 25 25  29   BUN 59* 56* 57*  CREATININE 2.72* 2.80* 3.25*  CALCIUM 7.8* 8.0* 9.2  GLUCOSE 93 89 93    CBG (last 3)   Recent Labs  08/27/13 0023 08/27/13 0559 08/27/13 1143  GLUCAP 96 89 113*    Scheduled Meds: . chlorhexidine  15 mL Mouth/Throat BID  . ertapenem  1 g Intravenous Daily  . feeding supplement (ENSURE COMPLETE)  237 mL Oral BID BM  . metoprolol tartrate  25 mg Oral TID  . pantoprazole  40 mg Oral Q1200    Continuous Infusions:    Oran Rein, RD, LDN Clinical Inpatient Dietitian Pager:  [phone] Weekend and after hours pager:  [phone]

## 2013-08-28 NOTE — Consult Note (Addendum)
WOC follow-up: Vac has been D/Ced to right arm.  Wound full thickness 2.5X2.5X.2cm, 100% beefy red. VVS team following for assessment and plan of care to bilat arms, hands and feet.  Progress notes indicate he will be scheduled for surgery soon. Please refer to this team for further assessment and plan of care and re-consult if further assistance is needed.  Thank-you,  Cammie Mcgee MSN, RN, CWOCN, [place], CNS [phone]

## 2013-08-29 LAB — CBC
Hemoglobin: 8.4 g/dL — ABNORMAL LOW (ref 13.0–17.0)
MCH: 30.4 pg (ref 26.0–34.0)
MCHC: 32.3 g/dL (ref 30.0–36.0)
MCV: 94.2 fL (ref 78.0–100.0)
Platelets: 277 10*3/uL (ref 150–400)
RBC: 2.76 MIL/uL — ABNORMAL LOW (ref 4.22–5.81)

## 2013-08-29 LAB — BASIC METABOLIC PANEL
CO2: 30 mEq/L (ref 19–32)
Calcium: 9 mg/dL (ref 8.4–10.5)
Chloride: 94 mEq/L — ABNORMAL LOW (ref 96–112)
Creatinine, Ser: 3.23 mg/dL — ABNORMAL HIGH (ref 0.50–1.35)
GFR calc non Af Amer: 21 mL/min — ABNORMAL LOW (ref >90)

## 2013-08-29 MED ORDER — ACETAMINOPHEN 160 MG/5ML PO SOLN
650.0000 mg | ORAL | Status: DC | PRN
  Administered 2013-08-29: 650 mg via ORAL
  Filled 2013-08-29 (×2): qty 20.3

## 2013-08-29 NOTE — Progress Notes (Signed)
Physical Therapy Treatment Patient Details Name: Timothy Kline MRN: [id number] DOB: 10-23-1960 Today's Date: 08/29/2013 Time: 6213-0865 PT Time Calculation (min): 31 min  PT Assessment / Plan / Recommendation  History of Present Illness 52 year old male presented to [facility] 10/28 with AMS/Sepsis in setting of cocaine /oxycodone use. Pt admitted with Respiratory Failure (ARDS)/Sepsis has developed acute renal failure, thrombocytopenia, a-fib Flutter with RVR and was transferred to [facility] on 10/31 for HD and further care. 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 Pt scheduled for foot/toe amputation tomorrow and discussed the discharge options and need to continue working on transfers and mobility pending post-op instructions. Will continue to follow and await weight bearing and activity restrictions post-op.   Follow Up Recommendations  SNF;CIR (pending orders post-op)     Does the patient have the potential to tolerate intense rehabilitation     Barriers to Discharge        Equipment Recommendations       Recommendations for Other Services    Frequency     Progress towards PT Goals Progress towards PT goals: Progressing toward goals  Plan Current plan remains appropriate    Precautions / Restrictions Precautions Precautions: Fall Precaution Comments: unable to use hands for functional tasks Restrictions Other Position/Activity Restrictions: no bil LE restrictions per Dr.Brabham   Pertinent Vitals/Pain 7/10 hands and feet, repositioned     Mobility  Bed Mobility Bed Mobility: Rolling Right;Rolling Left;Supine to Sit;Sit to Supine;Sitting - Scoot to Edge of Bed Rolling Right: 4: Min assist Rolling Left: 4: Min assist Supine to Sit: 4: Min assist;HOB flat;With rails Sitting - Scoot to Edge of Bed: 4: Min assist Sit to Supine: 4: Min assist;HOB flat Scooting to HOB: 1: +2 Total assist Scooting to [facility]: Patient Percentage: 10% Details for Bed Mobility Assistance: cueing for sequence with ability to hook right arm on rail for rolling with assist of lower body. Supine to sit assist to fully elevate trunk as pt able to transfer to semi sidelying propping on left elbow but unable to fully elevate trunk without assist. pt returne to elbow propping with assist to elevate legs to return to supine Transfers Sit to Stand: 1: +2 Total assist;From bed;From elevated surface Sit to Stand: Patient Percentage: 60% Stand to Sit: 1: +2 Total assist;To bed Stand to Sit: Patient Percentage: 60% Details for Transfer Assistance: Pt stood from elevated bed with supported bil platform walker with cues for anterior translation of hips and trunk extension. Pt able to stand grossly 30sec and denied pivot to chair today with assist to control descent to surface. pt with increased ablity in standing to step forward and back with PFRW but unable to tolerate standing long enough to walk.  Ambulation/Gait Ambulation/Gait Assistance: Not tested (comment)    Exercises General Exercises - Lower Extremity Long Arc Quad: AROM;Seated;Both;10 reps Hip Flexion/Marching: AROM;Seated;Both;10 reps   PT Diagnosis:    PT Problem List:   PT Treatment Interventions:     PT Goals (current goals can now be found in the care plan section)  Visit Information  Last PT Received On: 08/29/13 Assistance Needed: +1 History of Present Illness: 52 year old male presented to [facility] 10/28 with AMS/Sepsis in setting of cocaine /oxycodone use. Pt admitted with  Respiratory Failure (ARDS)/Sepsis has developed acute renal failure, thrombocytopenia, a-fib Flutter with RVR and was transferred to [facility] on 10/31 for HD and further care. 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    Subjective Data      Cognition  Cognition Arousal/Alertness: Awake/alert Behavior During Therapy: WFL for tasks assessed/performed Overall Cognitive Status: Within Functional Limits for tasks assessed    Balance  Static Sitting Balance Static Sitting - Balance Support: Feet supported;No upper extremity supported Static Sitting - Level of Assistance: 5: Stand by assistance Static Sitting - Comment/# of Minutes: 5  End of Session PT - End of Session Equipment Utilized During Treatment: Gait belt Activity Tolerance: Patient limited by pain Patient left: in bed;with call bell/phone within reach;with nursing/sitter in room Nurse Communication: Mobility status   GP     Delorse Lek 08/29/2013, 12:30 PM Delaney Meigs, PT [phone]

## 2013-08-29 NOTE — Progress Notes (Addendum)
VVS - Pre-op  Pt to have Right Trans metatarsal amputation, and left digital amputation x 5 - 11/26  INR WNL Cr 3.23 - stable H/H 8.4/26; PLT 277  Orders written  [name],[name] 8:05 AM 08/29/2013  Pt with no complaints  CV:  RRR ABD soft Ext:  No interval changes.  All 10 toes are necrotic  D/w patietn proceeding with amputation tomorrow.Marland Kitchen  Operation and recovery discussed.  All questions answered.  Timothy Kline

## 2013-08-29 NOTE — Progress Notes (Signed)
Occupational Therapy Treatment Patient Details Name: Timothy Kline MRN: [id number] DOB: Jan 11, 1961 Today's Date: 08/29/2013 Time: 4098-1191 OT Time Calculation (min): 16 min  OT Assessment / Plan / Recommendation  History of present illness 52 year old male presented to [facility] 10/28 with AMS/Sepsis in setting of cocaine /oxycodone use. Pt admitted with Respiratory Failure (ARDS)/Sepsis has developed acute renal failure, thrombocytopenia, a-fib Flutter with RVR and was transferred to [facility] on 10/31 for HD and further care. 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 Agreeable to participation in skilled ot for b ue exercises and rom.  Note surgery scheduled for tomorrow.  Follow Up Recommendations  SNF;CIR;Home health OT           Equipment Recommendations  3 in 1 bedside comode        Frequency Min 1X/week   Progress towards OT Goals Progress towards OT goals: Progressing toward goals        Precautions / Restrictions Precautions Precautions: Fall Precaution Comments: unable to use hands for functional tasks Restrictions Other Position/Activity Restrictions: no bil LE restrictions per Dr.Brabham   Pertinent Vitals/Pain No c/o per pt.    OT Goals(current goals can now be found in the care plan section)    Visit Information  Last OT Received On: 08/29/13 Assistance Needed: +1 History of Present Illness: 52 year old male presented to [facility] 10/28 with AMS/Sepsis in setting of cocaine /oxycodone use. Pt admitted with Respiratory Failure (ARDS)/Sepsis has developed acute renal failure, thrombocytopenia,  a-fib Flutter with RVR and was transferred to [facility] on 10/31 for HD and further care. 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    Subjective Data    "i am taking it easy today since i have surgery tomorrow"          Cognition  Cognition Arousal/Alertness: Awake/alert Behavior During Therapy: WFL for tasks assessed/performed Overall Cognitive Status: Within Functional Limits for tasks assessed Area of Impairment: Problem solving Problem Solving: Slow processing       Exercises  General Exercises - Lower Extremity Long Arc Quad: AROM;Seated;Both;10 reps Hip Flexion/Marching: AROM;Seated;Both;10 reps Shoulder Exercises Shoulder Flexion: AAROM;Both;10 reps;Supine Shoulder ABduction: AAROM;Both;10 reps;Supine Shoulder External Rotation: AROM;Left;5 reps;Supine Elbow Flexion: AAROM;Both;10 reps;Supine      End of Session OT - End of Session Activity Tolerance: Patient tolerated treatment well Patient left: in bed;with call bell/phone within reach       Robet Leu, COTA/L 08/29/2013, 2:53 PM

## 2013-08-29 NOTE — Progress Notes (Signed)
[facility] PROGRESS NOTE  Timothy Kline ZOX:[id number] DOB: 1961-05-26 DOA: 08/04/2013 PCP: No primary provider on file.  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 - plan for OR this Wed per vascular ~ 9am, appreciate input.  - discussed with Dr. Izora Ribas this morning regarding hand, he will stop by this afternoon and is aware of patient's planned OR tomorrow.  Acute renal failure  - CRRT started 10/31 transitioned to intermittent dialysis Last dialysis 11/9. - Renal followed and signed off on 11/12 - kidney function worse 11/24, stable 11/25. If continues to deteriorate will re-consult. Good UOP. 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  - Patient's blood cultures from [place] grew Fusobacterium Necrophorum - he was placed on antibiotics with cefepime and vancomycin (had been on Rocephin/Levaquin/Vanc at [place]) - ID was consulted and has been following patient for antibiotic recommendations - He also required vasopressors and as he improved clinically and blood pressure stabilized this were discontinued - WBC still up today, afebrile>> ID recs as per 11/17 note>> will change to invanz, and CVL is to be removed as it was put in while pt was bacteremic>> IR placed new tunnelled line 11/19, old CVL removed - needs 4 weeks of IV Invanz then Augmentin until CXR cavitary changes resolve Multifocal cavitary pneumonia /Acute respiratory failure/ARDS - Patient was on the vent through11/10 and he was extubated, respiratory status remained stable. Abx as above. Thrombocytopenia with coagulopathy - secondary to Sepsis and DIC - As treated with antibiotics as discussed above, hematology was consulted and followed - improving, platelets now normal. Anemia/Heme + stool - Per GI his anemia is multifactorial and endoscopic evaluation would not change his clinical outcome at this time. - hgb stable, and no gross bleeding - continue to monitor hgb and transfuse  as appropriate  a-fib Flutter with RVR  - S/P10/31 Cardioversion for Afib/Flutter rate 170's ( Unsuccessful).  - also was treated with Amiodarone  for Afib/Flutter RVR no dc'ed - rate remained controlled on current metoprolol TID Cardiomyopathy-Echocardiogram: LVEF 20-25%. 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 He was admitted with Respiratory Failure (ARDS)/Sepsis and developed acute renal failure, thrombocytopenia, a-fib Flutter with RVR and was transferred to [facility] service here at [facility] on 10/31 for HD and further care.  SIGNIFICANT EVENTS / STUDIES:  10/28 Admission to [facility]  10/29 Metabolic Acidosis, pH=6.9  10/31 Transferred to [facility] for HD.  10/31 Cardioversion for Afib/Flutter rate 170's ( Unsuccessful). Amiodarone initiated for Afib/Flutter RVR  10/31 Renal consult: CRRT initiated  10/31 Heme consult: TCP likely due to DIC. Doubt TTP/HUS  11/01 Echocardiogram: LVEF 20-25%. Diffuse HK  11/01 ID consult  11/01 Skin biopsy: thrombotic vasculopathic reaction  11/2 tolerated HD, off pressors. Platelet transfusion for thrombocytopenia  11/3  CXR with new pulmonary opacities/nodules. Concern for septic emboli.  11/03 CT chest: Multilobar bilateral areas of pulmonary parenchymal nodular consolidation with internal necrosis/ cavitation and suggestion of surrounding hazy opacity. The appearance is most typical for septic emboli  11/03 CT head: NAD  11/03 BCx from [place] 2/2 + for FUSOBACTERIUM NECROPHORUM.  11/04 CT neck (noncontrasted): No evidence of jugular vein thrombosis, abscesses, inflammation.  11/05 Jugular vein [place]: No thrombus noted  11/04 Converted to NSR from Afib/Aflutter  11/05 Dexmedetomidine initiated in anticipation of extubation soon.  11/06 C Diff negative, Tracheal aspirate few yeast  11/06 Continued fevers, restart vanc for possible MRSA PNA  11/07 Sinus Tract R antecubital arm at site of previous punch bx discovered  11/08 MRI R forearm/elbow/humerus: inadequate study due to inability to cooperate  11/09 Debridement and lavage of RUE wound (Coley). No joint space infxn  11/09 Hypotension > norepi. Acute blood loss anemia (probably post op) > 2 units RBCs. Weaned off norepi after RBCs  11/09 Vasc surg consult for digital gangrene. No acute intervention, allow demarcation, possible amputation in future  11/10 Extubated, tolerated well  11/10 Pt in PAF despite amiodarone  11/11 1 u pRBC transfused  11/12 NSR, restart Metoprolol  11/13 FOBT +, Hgb to 6.6  11/14 Transfused 2 u pRBCs  11/14 GI consult for possible UGI vs LGI bleed   Code Status: partial Family Communication: none at bedside  Disposition Plan: pending hand/vascular surgical timing  Consultants:  GI - s/o  Vascular  ID - s/o  Renal - s/o  Heme/onc - s/o  Dermatology - s/o  cardiololgy - s/o  Procedures: LINES / TUBES:  R IJ CVL 10/28 >>>   ETT 10/29 >> 11/10  10/31 Cardioversion for Afib/Flutter rate 170's ( Unsuccessful) R femoral HD Cath 11/01 >>>11/12 11/01 Echocardiogram: LVEF 20-25%. Diffuse HK 11/01 Skin biopsy:  thrombotic vasculopathic reaction  11/09 Debridement and lavage of RUE wound (Coley). No joint space infxn  11/10 Extubated, tolerated well  CULTURES:  [facility] >> + Fusobacterium Necrophorum  BCx2 10/31>>> Negative  Sputum 10/31>> Few yeast  Tracheal Asp. 11/05>> Rare WBC, no organisms  C Diff 11/5 >> Negative  RUE wound 11/7 >> Negative   Antibiotics: Rocephin: 10/28?>>>10/31  Levaquin: 10/28>>>10/31  Vancomycin 10/28>>> 11/3  Cefepime 10/31>>> 11/3  Flagyl 11/2 >> 11/3  Meropenem 11/3 >> 11/17 Vancomycin 11/6 >> 11/13 Invanz 11/17 >>   HPI/Subjective: - no new complaints, anxious about surgery tomorrow.    Objective: Filed Vitals:   08/29/13 0619  BP: 124/76  Pulse: 76  Temp: 97.3 F (36.3 C)  Resp: 18    Intake/Output Summary (Last 24 hours) at 08/29/13 0850 Last data filed at 08/29/13 4540  Gross per 24 hour  Intake   1740 ml  Output   5600 ml  Net  -3860 ml   Filed Weights   08/27/13 0531 08/28/13 0621 08/29/13 0619  Weight: 80.4 kg (177 lb 4 oz) 78.654 kg (173 lb 6.4 oz) 77.2 kg (170 lb 3.1 oz)   Exam:  General: alert & oriented x 3 In NAD Cardiovascular: RRR nl S1 s2 Respiratory: Decreased air entry bil, no crackles or wheezes Abdomen: soft +BS NT/ND, no masses palpable Extremities: +2 edema, dry gangrene of toes bilat. And fingers L>R dresssing over RUE wound clean and dry  Data Reviewed: Basic Metabolic Panel:  Recent Labs Lab 08/23/13 0450 08/24/13 0535 08/25/13 0500 08/28/13 0455 08/29/13 0452  NA 138 137 134* 135 134*  K 4.8 4.6 4.5 4.0 3.8  CL 105 102 101 96 94*  CO2 24 25 25 29 30   GLUCOSE 92 93 89 93 90  BUN 62* 59* 56* 57* 54*  CREATININE 2.74* 2.72* 2.80* 3.25* 3.23*  CALCIUM 7.8* 7.8* 8.0* 9.2 9.0   CBC:  Recent Labs Lab 08/23/13 0450 08/24/13 0535 08/25/13 0500 08/28/13 0455 08/29/13 0452  WBC 18.1* 15.6* 14.2* 19.5* 17.6*  HGB 8.6* 8.5* 8.3* 8.1* 8.4*  HCT 26.3* 25.9* 24.8* 24.8* 26.0*  MCV 92.9  92.8 92.9 93.9 94.2  PLT 85* 94* 109* 227 277    CBG:  Recent Labs Lab 08/26/13 1205 08/26/13 1818 08/27/13 0023 08/27/13 0559 08/27/13 1143  GLUCAP 106* 124* 96 89 113*    No results found for this or any previous visit (from the past 240 hour(s)).   Studies: No results found.  Scheduled Meds: . chlorhexidine  15 mL Mouth/Throat BID  . ertapenem  1 g Intravenous Daily  . feeding supplement (ENSURE COMPLETE)  237 mL Oral BID BM  . metoprolol tartrate  25 mg Oral TID  . 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 If 7PM-7AM, please contact night-coverage at [email or web address], password [facility] 08/29/2013, 8:50 AM  LOS: 25 days

## 2013-08-30 ENCOUNTER — Encounter (HOSPITAL_COMMUNITY): Payer: BC Managed Care – PPO | Admitting: Anesthesiology

## 2013-08-30 ENCOUNTER — Inpatient Hospital Stay (HOSPITAL_COMMUNITY): Payer: BC Managed Care – PPO | Admitting: Anesthesiology

## 2013-08-30 ENCOUNTER — Encounter (HOSPITAL_COMMUNITY)
Admission: AD | Disposition: A | Discharge status: Skilled Nursing Facility | Payer: Self-pay | Source: Other Acute Inpatient Hospital | Attending: Internal Medicine

## 2013-08-30 HISTORY — PX: TRANSMETATARSAL AMPUTATION: SHX6197

## 2013-08-30 HISTORY — PX: AMPUTATION: SHX166

## 2013-08-30 LAB — BASIC METABOLIC PANEL
Calcium: 9.1 mg/dL (ref 8.4–10.5)
Chloride: 96 mEq/L (ref 96–112)
Creatinine, Ser: 3.23 mg/dL — ABNORMAL HIGH (ref 0.50–1.35)
GFR calc Af Amer: 24 mL/min — ABNORMAL LOW (ref >90)

## 2013-08-30 LAB — CBC
Platelets: 329 10*3/uL (ref 150–400)
RDW: 15.8 % — ABNORMAL HIGH (ref 11.5–15.5)
WBC: 19.7 10*3/uL — ABNORMAL HIGH (ref 4.0–10.5)

## 2013-08-30 SURGERY — AMPUTATION DIGIT
Anesthesia: General | Site: Hand | Laterality: Right | Wound class: Dirty or Infected

## 2013-08-30 SURGERY — AMPUTATION, FOOT, TRANSMETATARSAL
Anesthesia: General | Site: Foot | Laterality: Right | Wound class: Dirty or Infected

## 2013-08-30 MED ORDER — FENTANYL CITRATE 0.05 MG/ML IJ SOLN
INTRAMUSCULAR | Status: DC | PRN
  Administered 2013-08-30: 50 ug via INTRAVENOUS
  Administered 2013-08-30 (×4): 25 ug via INTRAVENOUS
  Administered 2013-08-30: 100 ug via INTRAVENOUS

## 2013-08-30 MED ORDER — PROPOFOL 10 MG/ML IV BOLUS
INTRAVENOUS | Status: DC | PRN
  Administered 2013-08-30: 150 mg via INTRAVENOUS

## 2013-08-30 MED ORDER — HYDROMORPHONE HCL PF 1 MG/ML IJ SOLN
INTRAMUSCULAR | Status: AC
  Administered 2013-08-30: 0.5 mg
  Filled 2013-08-30: qty 1

## 2013-08-30 MED ORDER — SUCCINYLCHOLINE CHLORIDE 20 MG/ML IJ SOLN
INTRAMUSCULAR | Status: DC | PRN
  Administered 2013-08-30: 100 mg via INTRAVENOUS

## 2013-08-30 MED ORDER — 0.9 % SODIUM CHLORIDE (POUR BTL) OPTIME
TOPICAL | Status: DC | PRN
  Administered 2013-08-30: 1000 mL

## 2013-08-30 MED ORDER — GLYCOPYRROLATE 0.2 MG/ML IJ SOLN
INTRAMUSCULAR | Status: DC | PRN
  Administered 2013-08-30: 0.6 mg via INTRAVENOUS

## 2013-08-30 MED ORDER — SODIUM CHLORIDE 0.9 % IV SOLN: INTRAVENOUS | Status: DC

## 2013-08-30 MED ORDER — HYDROMORPHONE HCL PF 1 MG/ML IJ SOLN
0.2500 mg | INTRAMUSCULAR | Status: DC | PRN
  Administered 2013-08-30 – 2013-09-05 (×3): 0.5 mg via INTRAVENOUS
  Filled 2013-08-30 (×3): qty 1

## 2013-08-30 MED ORDER — HYDROMORPHONE HCL PF 1 MG/ML IJ SOLN
0.5000 mg | INTRAMUSCULAR | Status: AC | PRN
  Administered 2013-08-30 (×2): 0.5 mg via INTRAVENOUS

## 2013-08-30 MED ORDER — LIDOCAINE HCL (PF) 1 % IJ SOLN
INTRAMUSCULAR | Status: AC
  Filled 2013-08-30: qty 30

## 2013-08-30 MED ORDER — SODIUM CHLORIDE 0.9 % IV SOLN
INTRAVENOUS | Status: DC
  Administered 2013-08-30 (×2): via INTRAVENOUS

## 2013-08-30 MED ORDER — HYDROMORPHONE HCL PF 1 MG/ML IJ SOLN
INTRAMUSCULAR | Status: AC
  Filled 2013-08-30: qty 1

## 2013-08-30 MED ORDER — BUPIVACAINE HCL (PF) 0.5 % IJ SOLN
INTRAMUSCULAR | Status: DC | PRN
  Administered 2013-08-30: 30 mL

## 2013-08-30 MED ORDER — OXYCODONE HCL 5 MG PO TABS
ORAL_TABLET | ORAL | Status: AC
  Filled 2013-08-30: qty 1

## 2013-08-30 MED ORDER — PHENYLEPHRINE HCL 10 MG/ML IJ SOLN
INTRAMUSCULAR | Status: DC | PRN
  Administered 2013-08-30: 80 ug via INTRAVENOUS

## 2013-08-30 MED ORDER — FENTANYL CITRATE 0.05 MG/ML IJ SOLN
INTRAMUSCULAR | Status: DC | PRN
  Administered 2013-08-30 (×2): 100 ug via INTRAVENOUS
  Administered 2013-08-30: 50 ug via INTRAVENOUS

## 2013-08-30 MED ORDER — EPHEDRINE SULFATE 50 MG/ML IJ SOLN
INTRAMUSCULAR | Status: DC | PRN
  Administered 2013-08-30 (×3): 10 mg via INTRAVENOUS

## 2013-08-30 MED ORDER — NEOSTIGMINE METHYLSULFATE 1 MG/ML IJ SOLN
INTRAMUSCULAR | Status: DC | PRN
  Administered 2013-08-30: 3 mg via INTRAVENOUS

## 2013-08-30 MED ORDER — LIDOCAINE HCL (CARDIAC) 20 MG/ML IV SOLN
INTRAVENOUS | Status: DC | PRN
  Administered 2013-08-30: 50 mg via INTRAVENOUS

## 2013-08-30 MED ORDER — HYDROMORPHONE HCL PF 1 MG/ML IJ SOLN
0.2500 mg | INTRAMUSCULAR | Status: DC | PRN
  Administered 2013-08-30 (×4): 0.5 mg via INTRAVENOUS

## 2013-08-30 MED ORDER — OXYCODONE HCL 5 MG PO TABS: 5.0000 mg | ORAL_TABLET | Freq: Once | ORAL | Status: DC

## 2013-08-30 MED ORDER — ROCURONIUM BROMIDE 100 MG/10ML IV SOLN
INTRAVENOUS | Status: DC | PRN
  Administered 2013-08-30: 40 mg via INTRAVENOUS

## 2013-08-30 SURGICAL SUPPLY — 33 items
BANDAGE CONFORM 3  STR LF (GAUZE/BANDAGES/DRESSINGS) IMPLANT
BANDAGE ELASTIC 4 VELCRO ST LF (GAUZE/BANDAGES/DRESSINGS) ×3 IMPLANT
BANDAGE GAUZE ELAST BULKY 4 IN (GAUZE/BANDAGES/DRESSINGS) ×3 IMPLANT
BLADE AVERAGE 25X9 (BLADE) IMPLANT
BLADE SAW RECIP 87.9 MT (BLADE) IMPLANT
CANISTER SUCTION 2500CC (MISCELLANEOUS) ×3 IMPLANT
COVER SURGICAL LIGHT HANDLE (MISCELLANEOUS) ×3 IMPLANT
DRAPE EXTREMITY T 121X128X90 (DRAPE) ×3 IMPLANT
DRSG PAD ABDOMINAL 8X10 ST (GAUZE/BANDAGES/DRESSINGS) ×1 IMPLANT
ELECT REM PT RETURN 9FT ADLT (ELECTROSURGICAL) ×3
ELECTRODE REM PT RTRN 9FT ADLT (ELECTROSURGICAL) ×2 IMPLANT
GAUZE XEROFORM 5X9 LF (GAUZE/BANDAGES/DRESSINGS) ×1 IMPLANT
GLOVE BIOGEL PI IND STRL 7.5 (GLOVE) ×2 IMPLANT
GLOVE BIOGEL PI INDICATOR 7.5 (GLOVE) ×1
GLOVE SURG SS PI 7.5 STRL IVOR (GLOVE) ×3 IMPLANT
GOWN PREVENTION PLUS XXLARGE (GOWN DISPOSABLE) ×3 IMPLANT
GOWN STRL NON-REIN LRG LVL3 (GOWN DISPOSABLE) ×9 IMPLANT
KIT BASIN OR (CUSTOM PROCEDURE TRAY) ×3 IMPLANT
KIT ROOM TURNOVER OR (KITS) ×3 IMPLANT
NDL HYPO 25GX1X1/2 BEV (NEEDLE) IMPLANT
NEEDLE HYPO 25GX1X1/2 BEV (NEEDLE) IMPLANT
NS IRRIG 1000ML POUR BTL (IV SOLUTION) ×3 IMPLANT
PACK GENERAL/GYN (CUSTOM PROCEDURE TRAY) ×3 IMPLANT
PAD ARMBOARD 7.5X6 YLW CONV (MISCELLANEOUS) ×6 IMPLANT
SPECIMEN JAR SMALL (MISCELLANEOUS) ×3 IMPLANT
SPONGE GAUZE 4X4 12PLY (GAUZE/BANDAGES/DRESSINGS) ×3 IMPLANT
SUT ETHILON 3 0 PS 1 (SUTURE) ×3 IMPLANT
SYR CONTROL 10ML LL (SYRINGE) IMPLANT
TOWEL OR 17X24 6PK STRL BLUE (TOWEL DISPOSABLE) ×3 IMPLANT
TOWEL OR 17X26 10 PK STRL BLUE (TOWEL DISPOSABLE) ×3 IMPLANT
TUBE ANAEROBIC SPECIMEN COL (MISCELLANEOUS) IMPLANT
UNDERPAD 30X30 INCONTINENT (UNDERPADS AND DIAPERS) ×3 IMPLANT
WATER STERILE IRR 1000ML POUR (IV SOLUTION) ×3 IMPLANT

## 2013-08-30 SURGICAL SUPPLY — 37 items
BANDAGE CONFORM 2  STR LF (GAUZE/BANDAGES/DRESSINGS) ×1 IMPLANT
BANDAGE ELASTIC 4 VELCRO ST LF (GAUZE/BANDAGES/DRESSINGS) ×3 IMPLANT
BANDAGE GAUZE ELAST BULKY 4 IN (GAUZE/BANDAGES/DRESSINGS) ×4 IMPLANT
BNDG CMPR 9X4 STRL LF SNTH (GAUZE/BANDAGES/DRESSINGS) ×2
BNDG COHESIVE 1X5 TAN STRL LF (GAUZE/BANDAGES/DRESSINGS) ×1 IMPLANT
BNDG ESMARK 4X9 LF (GAUZE/BANDAGES/DRESSINGS) ×3 IMPLANT
CHLORAPREP W/TINT 26ML (MISCELLANEOUS) ×2 IMPLANT
CORDS BIPOLAR (ELECTRODE) ×3 IMPLANT
DRSG ADAPTIC 3X8 NADH LF (GAUZE/BANDAGES/DRESSINGS) ×2 IMPLANT
ELECT REM PT RETURN 9FT ADLT (ELECTROSURGICAL)
ELECTRODE REM PT RTRN 9FT ADLT (ELECTROSURGICAL) IMPLANT
GAUZE VASELINE 3X9 (GAUZE/BANDAGES/DRESSINGS) ×1 IMPLANT
GAUZE XEROFORM 1X8 LF (GAUZE/BANDAGES/DRESSINGS) ×4 IMPLANT
GAUZE XEROFORM 5X9 LF (GAUZE/BANDAGES/DRESSINGS) ×1 IMPLANT
GLOVE BIOGEL M STRL SZ7.5 (GLOVE) ×3 IMPLANT
GOWN PREVENTION PLUS XLARGE (GOWN DISPOSABLE) ×3 IMPLANT
GOWN STRL NON-REIN LRG LVL3 (GOWN DISPOSABLE) ×3 IMPLANT
KIT BASIN OR (CUSTOM PROCEDURE TRAY) ×3 IMPLANT
KIT ROOM TURNOVER OR (KITS) ×3 IMPLANT
NDL HYPO 25GX1X1/2 BEV (NEEDLE) IMPLANT
NEEDLE HYPO 25GX1X1/2 BEV (NEEDLE) ×3 IMPLANT
NS IRRIG 1000ML POUR BTL (IV SOLUTION) ×3 IMPLANT
PACK ORTHO EXTREMITY (CUSTOM PROCEDURE TRAY) ×3 IMPLANT
PAD ARMBOARD 7.5X6 YLW CONV (MISCELLANEOUS) ×6 IMPLANT
PAD CAST 4YDX4 CTTN HI CHSV (CAST SUPPLIES) ×2 IMPLANT
PADDING CAST COTTON 4X4 STRL (CAST SUPPLIES)
SOAP 2 % CHG 4 OZ (WOUND CARE) ×3 IMPLANT
SPONGE GAUZE 4X4 12PLY (GAUZE/BANDAGES/DRESSINGS) ×3 IMPLANT
SPONGE LAP 18X18 X RAY DECT (DISPOSABLE) ×3 IMPLANT
SPONGE LAP 4X18 X RAY DECT (DISPOSABLE) ×3 IMPLANT
SUT ETHILON 5 0 PS 2 18 (SUTURE) ×4 IMPLANT
SYR CONTROL 10ML LL (SYRINGE) ×1 IMPLANT
TOWEL OR 17X24 6PK STRL BLUE (TOWEL DISPOSABLE) ×3 IMPLANT
TOWEL OR 17X26 10 PK STRL BLUE (TOWEL DISPOSABLE) ×3 IMPLANT
TUBE CONNECTING 12X1/4 (SUCTIONS) IMPLANT
WATER STERILE IRR 1000ML POUR (IV SOLUTION) ×3 IMPLANT
YANKAUER SUCT BULB TIP NO VENT (SUCTIONS) IMPLANT

## 2013-08-30 NOTE — Progress Notes (Signed)
[facility] PROGRESS NOTE  Timothy Kline WUJ:[id number] DOB: 1961/10/05 DOA: 08/04/2013 PCP: No primary provider on file.  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 - Patient S/P  - Dr Stevphen Meuse spoke with Dr. Izora Ribas regarding hand, he will stop by.   Acute renal failure  - CRRT started 10/31 transitioned to intermittent dialysis Last dialysis 11/9. - Renal followed and signed off on 11/12 - kidney function worse 11/24, stable 11/25. -hold on IV fluids due to heart failure.   -repeat B-met in am.   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  - Patient's blood cultures from [place] grew Fusobacterium Necrophorum - he was placed on antibiotics with cefepime and vancomycin (had been on Rocephin/Levaquin/Vanc at [place]) - ID was consulted and has been following patient for antibiotic recommendations - He also required vasopressors and as he improved clinically and blood pressure stabilized this were discontinued - WBC still up today, afebrile>> ID recs as per 11/17 note>> will change to invanz, and CVL is to be removed as it was put in while pt was bacteremic>> IR placed new tunnelled line 11/19, old CVL removed - needs 4 weeks of IV Invanz then Augmentin until CXR cavitary changes resolve.  Multifocal cavitary pneumonia /Acute respiratory failure/ARDS - Patient was on the vent through11/10 and he was extubated, respiratory status remained stable. Abx as above.  Thrombocytopenia with coagulopathy - secondary to Sepsis and DIC - As treated with antibiotics as discussed above, hematology was consulted and followed - improving, platelets now normal.  Anemia/Heme + stool - Per GI his anemia is multifactorial and endoscopic evaluation would not change his clinical outcome at this time. - hgb stable, and no gross bleeding - continue to monitor hgb and transfuse as appropriate  -Hb stable at 8.   a-fib Flutter with RVR  - S/P10/31 Cardioversion for  Afib/Flutter rate 170's ( Unsuccessful).  - also was treated with Amiodarone  for Afib/Flutter RVR no dc'ed - rate remained controlled on current metoprolol TID  Cardiomyopathy-Echocardiogram: LVEF 20-25%. 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 He was admitted with Respiratory Failure (ARDS)/Sepsis and developed acute renal failure, thrombocytopenia, a-fib Flutter with RVR and was transferred to [facility] service here at [facility] on 10/31 for HD and further care.   SIGNIFICANT EVENTS / STUDIES:  10/28 Admission to [facility]  10/29 Metabolic Acidosis, pH=6.9  10/31 Transferred to [facility] for HD.  10/31 Cardioversion for Afib/Flutter rate 170's ( Unsuccessful). Amiodarone initiated for Afib/Flutter RVR  10/31 Renal consult: CRRT initiated  10/31 Heme consult: TCP likely due to DIC. Doubt TTP/HUS  11/01 Echocardiogram: LVEF 20-25%. Diffuse HK  11/01 ID consult  11/01 Skin biopsy: thrombotic vasculopathic reaction  11/2 tolerated HD, off pressors. Platelet transfusion for thrombocytopenia  11/3 CXR with new pulmonary  opacities/nodules. Concern for septic emboli.  11/03 CT chest: Multilobar  bilateral areas of pulmonary parenchymal nodular consolidation with internal necrosis/ cavitation and suggestion of surrounding hazy opacity. The appearance is most typical for septic emboli  11/03 CT head: NAD  11/03 BCx from [place] 2/2 + for FUSOBACTERIUM NECROPHORUM.  11/04 CT neck (noncontrasted): No evidence of jugular vein thrombosis, abscesses, inflammation.  11/05 Jugular vein [place]: No thrombus noted  11/04 Converted to NSR from Afib/Aflutter  11/05 Dexmedetomidine initiated in anticipation of extubation soon.  11/06 C Diff negative, Tracheal aspirate few yeast  11/06 Continued fevers, restart vanc for possible MRSA PNA  11/07 Sinus Tract R antecubital arm at site of previous punch bx discovered  11/08 MRI R forearm/elbow/humerus: inadequate study due to inability to cooperate  11/09 Debridement and lavage of RUE wound (Coley). No joint space infxn  11/09 Hypotension > norepi. Acute blood loss anemia (probably post op) > 2 units RBCs. Weaned off norepi after RBCs  11/09 Vasc surg consult for digital gangrene. No acute intervention, allow demarcation, possible amputation in future  11/10 Extubated, tolerated well  11/10 Pt in PAF despite amiodarone  11/11 1 u pRBC transfused  11/12 NSR, restart Metoprolol  11/13 FOBT +, Hgb to 6.6  11/14 Transfused 2 u pRBCs  11/14 GI consult for possible UGI vs LGI bleed   Code Status: partial Family Communication: brother at bedside.  Disposition Plan: pending hand/vascular surgical timing  Consultants:  GI - s/o  Vascular  ID - s/o  Renal - s/o  Heme/onc - s/o  Dermatology - s/o  cardiololgy - s/o  Procedures: LINES / TUBES:  R IJ CVL 10/28 >>>   ETT 10/29 >> 11/10  10/31 Cardioversion for Afib/Flutter rate 170's ( Unsuccessful) R femoral HD Cath 11/01 >>>11/12 11/01 Echocardiogram: LVEF 20-25%. Diffuse HK 11/01 Skin biopsy: thrombotic vasculopathic reaction  11/09 Debridement and lavage of RUE wound (Coley). No joint space  infxn  11/10 Extubated, tolerated well  CULTURES:  [facility] >> + Fusobacterium Necrophorum  BCx2 10/31>>> Negative  Sputum 10/31>> Few yeast  Tracheal Asp. 11/05>> Rare WBC, no organisms  C Diff 11/5 >> Negative  RUE wound 11/7 >> Negative   Antibiotics: Rocephin: 10/28?>>>10/31  Levaquin: 10/28>>>10/31  Vancomycin 10/28>>> 11/3  Cefepime 10/31>>> 11/3  Flagyl 11/2 >> 11/3  Meropenem 11/3 >> 11/17 Vancomycin 11/6 >> 11/13 Invanz 11/17 >>   HPI/Subjective: No complaints, just came from surgery.   Objective: Filed Vitals:   08/30/13 1311  BP: 115/67  Pulse: 76  Temp: 97 F (36.1 C)  Resp: 18    Intake/Output Summary (Last 24 hours) at 08/30/13 1443 Last data filed at 08/30/13 1130  Gross per 24 hour  Intake   1280 ml  Output   3760 ml  Net  -2480 ml   Filed Weights   08/28/13 0621 08/29/13 0619 08/30/13 0445  Weight: 78.654 kg (173 lb 6.4 oz) 77.2 kg (170 lb 3.1 oz) 72.2 kg (159 lb 2.8 oz)   Exam:  General: alert & oriented x 3 In NAD Cardiovascular: RRR nl S1 s2 Respiratory: CTA Abdomen: soft +BS NT/ND, no masses palpable Extremities: +2 edema,  fingers L>R dry gangrene. LE with dressing.    Data Reviewed: Basic Metabolic Panel:  Recent Labs Lab 08/24/13 0535 08/25/13 0500 08/28/13 0455 08/29/13 0452 08/30/13 0540  NA 137 134* 135 134* 138  K 4.6 4.5 4.0 3.8 3.5  CL 102 101 96 94* 96  CO2 25 25 29  30  29  GLUCOSE 93 89 93 90 90  BUN 59* 56* 57* 54* 55*  CREATININE 2.72* 2.80* 3.25* 3.23* 3.23*  CALCIUM 7.8* 8.0* 9.2 9.0 9.1   CBC:  Recent Labs Lab 08/24/13 0535 08/25/13 0500 08/28/13 0455 08/29/13 0452 08/30/13 0540  WBC 15.6* 14.2* 19.5* 17.6* 19.7*  HGB 8.5* 8.3* 8.1* 8.4* 8.8*  HCT 25.9* 24.8* 24.8* 26.0* 27.4*  MCV 92.8 92.9 93.9 94.2 94.5  PLT 94* 109* 227 277 329    CBG:  Recent Labs Lab 08/26/13 1205 08/26/13 1818 08/27/13 0023 08/27/13 0559 08/27/13 1143  GLUCAP 106* 124* 96 89 113*    No results  found for this or any previous visit (from the past 240 hour(s)).   Studies: No results found.  Scheduled Meds: . chlorhexidine  15 mL Mouth/Throat BID  . ertapenem  1 g Intravenous Daily  . feeding supplement (ENSURE COMPLETE)  237 mL Oral BID BM  . HYDROmorphone      . HYDROmorphone      . HYDROmorphone      . metoprolol tartrate  25 mg Oral TID  . oxyCODONE      . pantoprazole  40 mg Oral Q1200   Continuous Infusions: . sodium chloride 20 mL/hr at 08/30/13 0913  . 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 If 7PM-7AM, please contact night-coverage at [email or web address], password [facility] 08/30/2013, 2:43 PM  LOS: 26 days

## 2013-08-30 NOTE — Anesthesia Postprocedure Evaluation (Signed)
  Anesthesia Post-op Note  Patient: Timothy Kline  Procedure(s) Performed: Procedure(s): TRANSMETATARSAL AMPUTATION- RIGHT FOOT (Right) LEFT FOOT DIGITAL AMPUTATION X 5 (Left)  Patient Location: PACU  Anesthesia Type:General  Level of Consciousness: awake  Airway and Oxygen Therapy: Patient Spontanous Breathing  Post-op Pain: mild  Post-op Assessment: Post-op Vital signs reviewed  Post-op Vital Signs: Reviewed  Complications: No apparent anesthesia complications

## 2013-08-30 NOTE — OR Nursing (Signed)
Procedure 1 end at 1835.

## 2013-08-30 NOTE — Op Note (Signed)
    Patient name: Timothy Kline MRN: [id number] DOB: 1961-01-18 Sex: male  08/04/2013 - 08/30/2013 Pre-operative Diagnosis: dry gangrene to left toes x5 and right forefoot Post-operative diagnosis:  Same Surgeon:  Jorge Ny Assistants:  None Procedure:   #1: Ray amputation x5, left foot   #2: Right transmetatarsal amputation Anesthesia:  Gen. Blood Loss:  See anesthesia record Specimens:  Left toes x5, right forefoot  Findings:  Dry gangrene to all 10 digits  Indications:  The patient is a 53 year old gentleman who was in the intensive care unit for sepsis.  He was on multiple antihypertensive medications.  Sepsis and hypotension with pressors led to ischemic changes to all 4 extremities.  I let these areas demarcate for greater than 1 week.  It is clear that he has dry gangrene to all 5 left foot toes, and the right forefoot.  We discussed continued observation and potential autoamputation vs. proceeding with amputation.  Ultimately in order to expedite his recovery, we have elected to proceed with right transmetatarsal amputation and left digital amputation.  Procedure:  The patient was identified in the holding area and taken to [facility] OR ROOM 06  The patient was then placed supine on the table. general anesthesia was administered.  The patient was prepped and draped in the usual sterile fashion.  A time out was called and antibiotics were administered.  A fishmouth incisions were made at the base of all 5 toes on the left, incorporating all dry eschar.  10 blade was used to cut down to the bone.  The bone was transected with a large bone cutter.  There was excellent pulsatile bleeding from all distal arteries.  I used a 1-2 debris back to bone so that the surface was smooth.  Hemostasis was then achieved.  3-0 nylon vertical mattress sutures were used to close each individual incision.  Next, the attention was turned towards the right forefoot.  I made a fishmouth incision as far distal as I  could, where there was viable tissue.  I then used a periosteal elevator to elevate the periosteum anteriorly.  A oscillating saw was then used to transect all 5 transmetatarsal bones.  Bovie and scissors were used to complete the amputation.  I then used a Kocher on each individual transmetatarsal head to resect bone back to where it would not compromise the skin closure.  I also made sure that all bone edges were smoothed.  All the bone was healthy.  There was excellent bleeding.  Once I was satisfied with the bone amputation sites, and hemostasis was achieved with cautery, the wound was irrigated with saline.  I then proceeded with closure of the skin flap using interrupted 3-0 nylon vertical mattress sutures.  Sterile dressings were applied.  There were no complications.   Disposition:  To PACU in stable condition.   Juleen China, M.D. [facility] [place] Office: [phone] Pager:  [phone]

## 2013-08-30 NOTE — Preoperative (Signed)
Beta Blockers   Reason not to administer Beta Blockers:Not Applicable 

## 2013-08-30 NOTE — Brief Op Note (Signed)
  08/04/2013 - 08/30/2013  7:55 PM  PATIENT:  Timothy Kline  52 y.o. male  PRE-OPERATIVE DIAGNOSIS:  Bilateral hand /finger ischemia with dry gangrene POST-OPERATIVE DIAGNOSIS:  Bilateral hand /finger ischemia with dry gangrene  PROCEDURE:  Procedure(s): Right Index Finger Partial Amputation Left Hand All Fingers Partial Amputations including thumb, partial thickness skin debridement of bilateral dorsal hands and distal forearms  SURGEON:  Surgeon(s): Knute Neu, MD  ANESTHESIA:   general  SPECIMEN:  No Specimen  FINDINGS:  Dry gangrene   PATIENT DISPOSITION:  PACU - hemodynamically stable.

## 2013-08-30 NOTE — Progress Notes (Signed)
S: pt s/p foot surgery today; asked to see pt regarding fingers - dry gangrene - no c/o finger pain, some foot pain.  O:Blood pressure 115/67, pulse 76, temperature 97 F (36.1 C), temperature source Oral, resp. rate 18, height 5\' 6"  (1.676 m), weight 72.2 kg (159 lb 2.8 oz), SpO2 92.00%.  Pt with open wound s/p debridement R antecubital fossa, decreasing in size with good granulation tissue without infection; RIF with dry gangrene back to prox dipj; some ischemic changes of R thumb and tip of RLF - will probably not need amputation; L Hand/fingers, dry gangrene of all fingers, thumb to prox phalanx  A: open wound R antecubital fossa; dry gangrene as described above bilateral fingers   P: will plan OR for amputations of RIF, all digits and thumb of L hand

## 2013-08-30 NOTE — Anesthesia Procedure Notes (Signed)
Procedure Name: Intubation Date/Time: 08/30/2013 6:05 PM Performed by: Gwenyth Allegra Pre-anesthesia Checklist: Emergency Drugs available, Patient identified, Timeout performed, Suction available and Patient being monitored Patient Re-evaluated:Patient Re-evaluated prior to inductionOxygen Delivery Method: Circle system utilized Preoxygenation: Pre-oxygenation with 100% oxygen Intubation Type: IV induction, Cricoid Pressure applied and Rapid sequence Laryngoscope Size: Mac and 4 Grade View: Grade I Tube type: Oral Tube size: 7.5 mm Number of attempts: 1 Airway Equipment and Method: Stylet Placement Confirmation: ETT inserted through vocal cords under direct vision,  breath sounds checked- equal and bilateral and positive ETCO2 Secured at: 22 cm Tube secured with: Tape Dental Injury: Teeth and Oropharynx as per pre-operative assessment

## 2013-08-30 NOTE — Plan of Care (Signed)
Problem: Phase I Progression Outcomes Goal: Hemodynamically stable Outcome: Progressing Vitals stable this shift.  Afebrile.  Continues to diurese with assistance of condom cath.  NPO at present pending multi-digit amputation.  Pain controlled with PO PRN analgesics.  No acute events overnight.  Will continue to monitor patient.

## 2013-08-30 NOTE — H&P (View-Only) (Signed)
VVS - Pre-op  Pt to have Right Trans metatarsal amputation, and left digital amputation x 5 - 11/26  INR WNL Cr 3.23 - stable H/H 8.4/26; PLT 277  Orders written  Tadeusz Stahl J 8:05 AM 08/29/2013  Pt with no complaints  CV:  RRR ABD soft Ext:  No interval changes.  All 10 toes are necrotic  D/w patietn proceeding with amputation tomorrow..  Operation and recovery discussed.  All questions answered.  Wells brabham 

## 2013-08-30 NOTE — Interval H&P Note (Signed)
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 After consideration of risks, benefits and other options for treatment, the patient has consented to  Procedure(s): TRANSMETATARSAL AMPUTATION- RIGHT FOOT (Right) LEFT FOOT DIGITAL AMPUTATION X 5 (Left) as a surgical intervention .  The patient's history has been reviewed, patient examined, no change in status, stable for surgery.  I have reviewed the patient's chart and labs.  Questions were answered to the patient's satisfaction.     BRABHAM IV, V. WELLS

## 2013-08-30 NOTE — Anesthesia Postprocedure Evaluation (Signed)
Anesthesia Post Note  Patient: Timothy Kline  Procedure(s) Performed: Procedure(s) (LRB): Right Index Finger Partial Amputation (Right) Left Hand All Fingers Partial Amputations (Left)  Anesthesia type: general  Patient location: PACU  Post pain: Pain level controlled  Post assessment: Patient's Cardiovascular Status Stable  Last Vitals:  Filed Vitals:   08/30/13 2034  BP: 119/80  Pulse: 102  Temp: 36.9 C  Resp: 19    Post vital signs: Reviewed and stable  Level of consciousness: sedated  Complications: No apparent anesthesia complications

## 2013-08-30 NOTE — Anesthesia Preprocedure Evaluation (Signed)
Anesthesia Evaluation  Patient identified by MRN, date of birth, ID band Patient awake    Reviewed: Allergy & Precautions, H&P , NPO status , Patient's Chart, lab work & pertinent test results  Airway Mallampati: II      Dental   Pulmonary neg pulmonary ROS,          Cardiovascular + dysrhythmias Atrial Fibrillation     Neuro/Psych    GI/Hepatic negative GI ROS, (+) Hepatitis -  Endo/Other    Renal/GU Renal disease     Musculoskeletal   Abdominal   Peds  Hematology  (+) anemia ,   Anesthesia Other Findings   Reproductive/Obstetrics                           Anesthesia Physical Anesthesia Plan  ASA: III  Anesthesia Plan: General   Post-op Pain Management:    Induction: Intravenous  Airway Management Planned: Oral ETT  Additional Equipment:   Intra-op Plan:   Post-operative Plan: Possible Post-op intubation/ventilation  Informed Consent: I have reviewed the patients History and Physical, chart, labs and discussed the procedure including the risks, benefits and alternatives for the proposed anesthesia with the patient or authorized representative who has indicated his/her understanding and acceptance.   Dental advisory given  Plan Discussed with: CRNA, Anesthesiologist and Surgeon  Anesthesia Plan Comments:         Anesthesia Quick Evaluation

## 2013-08-30 NOTE — Transfer of Care (Signed)
Immediate Anesthesia Transfer of Care Note  Patient: Timothy Kline  Procedure(s) Performed: Procedure(s): TRANSMETATARSAL AMPUTATION- RIGHT FOOT (Right) LEFT FOOT DIGITAL AMPUTATION X 5 (Left)  Patient Location: PACU  Anesthesia Type:General  Level of Consciousness: awake, alert  and oriented  Airway & Oxygen Therapy: Patient Spontanous Breathing and Patient connected to nasal cannula oxygen  Post-op Assessment: Report given to PACU RN and Post -op Vital signs reviewed and stable  Post vital signs: Reviewed and stable  Complications: No apparent anesthesia complications

## 2013-08-30 NOTE — Transfer of Care (Signed)
Immediate Anesthesia Transfer of Care Note  Patient: Timothy Kline  Procedure(s) Performed: Procedure(s): Right Index Finger Partial Amputation (Right) Left Hand All Fingers Partial Amputations (Left)  Patient Location: PACU  Anesthesia Type:General  Level of Consciousness: awake, alert  and oriented  Airway & Oxygen Therapy: Patient Spontanous Breathing and Patient connected to nasal cannula oxygen  Post-op Assessment: Post -op Vital signs reviewed and stable  Post vital signs: Reviewed and stable  Complications: No apparent anesthesia complications

## 2013-08-30 NOTE — Anesthesia Preprocedure Evaluation (Addendum)
Anesthesia Evaluation  Patient identified by MRN, date of birth, ID band Patient awake    Reviewed: Allergy & Precautions, H&P , NPO status   Airway Mallampati: II      Dental   Pulmonary neg pulmonary ROS,          Cardiovascular + dysrhythmias Atrial Fibrillation     Neuro/Psych    GI/Hepatic negative GI ROS, (+) Hepatitis -  Endo/Other    Renal/GU Renal disease     Musculoskeletal   Abdominal   Peds  Hematology   Anesthesia Other Findings   Reproductive/Obstetrics                         Anesthesia Physical Anesthesia Plan  ASA: III  Anesthesia Plan: General   Post-op Pain Management:    Induction: Intravenous  Airway Management Planned: Oral ETT  Additional Equipment:   Intra-op Plan:   Post-operative Plan:   Informed Consent: I have reviewed the patients History and Physical, chart, labs and discussed the procedure including the risks, benefits and alternatives for the proposed anesthesia with the patient or authorized representative who has indicated his/her understanding and acceptance.   Dental advisory given  Plan Discussed with: CRNA, Anesthesiologist and Surgeon  Anesthesia Plan Comments:        Anesthesia Quick Evaluation

## 2013-08-31 ENCOUNTER — Inpatient Hospital Stay (HOSPITAL_COMMUNITY): Payer: BC Managed Care – PPO

## 2013-08-31 LAB — BASIC METABOLIC PANEL
Calcium: 7.8 mg/dL — ABNORMAL LOW (ref 8.4–10.5)
Chloride: 99 mEq/L (ref 96–112)
Creatinine, Ser: 2.85 mg/dL — ABNORMAL HIGH (ref 0.50–1.35)
GFR calc non Af Amer: 24 mL/min — ABNORMAL LOW (ref >90)
Glucose, Bld: 92 mg/dL (ref 70–99)
Sodium: 137 mEq/L (ref 135–145)

## 2013-08-31 LAB — CBC
HCT: 23.3 % — ABNORMAL LOW (ref 39.0–52.0)
Hemoglobin: 7.5 g/dL — ABNORMAL LOW (ref 13.0–17.0)
MCH: 30.4 pg (ref 26.0–34.0)
MCHC: 32.2 g/dL (ref 30.0–36.0)
MCV: 94.3 fL (ref 78.0–100.0)
Platelets: 323 10*3/uL (ref 150–400)
RBC: 2.47 MIL/uL — ABNORMAL LOW (ref 4.22–5.81)

## 2013-08-31 LAB — PREPARE RBC (CROSSMATCH)

## 2013-08-31 MED ORDER — POTASSIUM CHLORIDE CRYS ER 20 MEQ PO TBCR
20.0000 meq | EXTENDED_RELEASE_TABLET | Freq: Once | ORAL | Status: AC
  Administered 2013-08-31: 20 meq via ORAL
  Filled 2013-08-31: qty 1

## 2013-08-31 MED ORDER — DOCUSATE SODIUM 100 MG PO CAPS
100.0000 mg | ORAL_CAPSULE | Freq: Two times a day (BID) | ORAL | Status: DC
  Administered 2013-08-31 – 2013-09-07 (×12): 100 mg via ORAL
  Filled 2013-08-31 (×16): qty 1

## 2013-08-31 NOTE — Progress Notes (Signed)
1830 Blood transfusion completed . Tolerated well by pt.

## 2013-08-31 NOTE — Progress Notes (Signed)
[facility] PROGRESS NOTE  Amaury Kuzel WJX:[id number] DOB: 1961-09-08 DOA: 08/04/2013 PCP: No primary provider on file.  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 -PT per surgery recommendation.   Acute renal failure  - CRRT started 10/31 transitioned to intermittent dialysis Last dialysis 11/9. - Renal followed and signed off on 11/12 - kidney function worse 11/24, stable 11/27. -Cr at 2.8. -hold on IV fluids due to heart failure.   -Replete K with 20 meq times one.    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  - Patient's blood cultures from [facility] grew Fusobacterium Necrophorum - he was placed on antibiotics with cefepime and vancomycin (had been on Rocephin/Levaquin/Vanc at [place]) - ID was consulted and has been following patient for antibiotic recommendations - He also required vasopressors and as he improved clinically and blood pressure stabilized this were discontinued - WBC still up today, afebrile>> ID recs as per 11/17 note>> invanz, and CVL is to be removed as it was put in while pt was bacteremic>> IR placed new tunnelled line 11/19, old CVL removed - needs 4 weeks of IV Invanz then Augmentin until CXR cavitary changes resolve. -WBC increasing. Repeat chest x ray. If WBC continue to increase tomorrow will re-consult ID.   Multifocal cavitary pneumonia /Acute respiratory failure/ARDS - Patient was on the vent through11/10 and he was extubated, respiratory status remained stable. Abx as above.  Thrombocytopenia with coagulopathy - secondary to Sepsis and DIC - As treated with antibiotics as discussed above, hematology was consulted and followed - improving,  platelets now normal.  Anemia/Heme + stool; acute illness.  - Per GI his anemia is multifactorial and endoscopic evaluation would not change his clinical outcome at this time. -  no gross bleeding - continue to monitor hgb and transfuse as appropriate  -Will transfuse one unit PRBC 11-27. HB at 7.5  a-fib Flutter with RVR  - S/P10/31 Cardioversion for Afib/Flutter rate 170's ( Unsuccessful).  - also was treated with Amiodarone  for Afib/Flutter RVR no dc'ed - rate remained controlled on current metoprolol TID  Cardiomyopathy-Echocardiogram: LVEF 20-25%. 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  Hypokalemia  Replete. 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 He was admitted with Respiratory Failure (ARDS)/Sepsis and developed acute renal failure, thrombocytopenia, a-fib Flutter with RVR and was transferred to [facility] service here at [facility] on 10/31 for HD and further care.   SIGNIFICANT EVENTS / STUDIES:  10/28 Admission to [facility]  10/29 Metabolic Acidosis, pH=6.9  10/31 Transferred to [facility] for HD.  10/31 Cardioversion for Afib/Flutter rate 170's ( Unsuccessful). Amiodarone initiated for Afib/Flutter  RVR  10/31 Renal consult: CRRT initiated  10/31 Heme  consult: TCP likely due to DIC. Doubt TTP/HUS  11/01 Echocardiogram: LVEF 20-25%. Diffuse HK  11/01 ID consult  11/01 Skin biopsy: thrombotic vasculopathic reaction  11/2 tolerated HD, off pressors. Platelet transfusion for thrombocytopenia  11/3 CXR with new pulmonary opacities/nodules. Concern for septic emboli.  11/03 CT chest: Multilobar bilateral areas of pulmonary parenchymal nodular consolidation with internal necrosis/ cavitation and suggestion of surrounding hazy opacity. The appearance is most typical for septic emboli  11/03 CT head: NAD  11/03 BCx from [place] 2/2 + for FUSOBACTERIUM NECROPHORUM.  11/04 CT neck (noncontrasted): No evidence of jugular vein thrombosis, abscesses, inflammation.  11/05 Jugular vein [place]: No thrombus noted  11/04 Converted to NSR from Afib/Aflutter  11/05 Dexmedetomidine initiated in anticipation of extubation soon.  11/06 C Diff negative, Tracheal aspirate few yeast  11/06 Continued fevers, restart vanc for possible MRSA PNA  11/07 Sinus Tract R antecubital arm at site of previous punch bx discovered  11/08 MRI R forearm/elbow/humerus: inadequate study due to inability to cooperate  11/09 Debridement and lavage of RUE wound (Coley). No joint space infxn  11/09 Hypotension > norepi. Acute blood loss anemia (probably post op) > 2 units RBCs. Weaned off norepi after RBCs  11/09 Vasc surg consult for digital gangrene. No acute intervention, allow demarcation, possible amputation in future  11/10 Extubated, tolerated well  11/10 Pt in PAF despite amiodarone  11/11 1 u pRBC transfused  11/12 NSR, restart Metoprolol  11/13 FOBT +, Hgb to 6.6  11/14 Transfused 2 u pRBCs  11/14 GI consult for possible UGI vs LGI bleed   Code Status: partial Family Communication: brother at bedside.  Disposition Plan: pending hand/vascular surgical timing  Consultants:  GI - s/o  Vascular  ID - s/o  Renal - s/o  Heme/onc - s/o  Dermatology -  s/o  cardiololgy - s/o  Procedures: LINES / TUBES:  R IJ CVL 10/28 >>>   ETT 10/29 >> 11/10  10/31 Cardioversion for Afib/Flutter rate 170's ( Unsuccessful) R femoral HD Cath 11/01 >>>11/12 11/01 Echocardiogram: LVEF 20-25%. Diffuse HK 11/01 Skin biopsy: thrombotic vasculopathic reaction  11/09 Debridement and lavage of RUE wound (Coley). No joint space infxn  11/10 Extubated, tolerated well  CULTURES:  [facility] >> + Fusobacterium Necrophorum  BCx2 10/31>>> Negative  Sputum 10/31>> Few yeast  Tracheal Asp. 11/05>> Rare WBC, no organisms  C Diff 11/5 >> Negative  RUE wound 11/7 >> Negative   Antibiotics: Rocephin: 10/28?>>>10/31  Levaquin: 10/28>>>10/31  Vancomycin 10/28>>> 11/3  Cefepime 10/31>>> 11/3  Flagyl 11/2 >> 11/3  Meropenem 11/3 >> 11/17 Vancomycin 11/6 >> 11/13 Invanz 11/17 >>   HPI/Subjective: Feeling well. No significant cough.  No diarrhea.   Objective: Filed Vitals:   08/31/13 1033  BP: 162/85  Pulse: 98  Temp:   Resp:     Intake/Output Summary (Last 24 hours) at 08/31/13 1111 Last data filed at 08/31/13 0955  Gross per 24 hour  Intake 2760.16 ml  Output   2575 ml  Net 185.16 ml   Filed Weights   08/29/13 0619 08/30/13 0445 08/31/13 0500  Weight: 77.2 kg (170 lb 3.1 oz) 72.2 kg (159 lb 2.8 oz) 78.2 kg (172 lb 6.4 oz)   Exam:  General: alert & oriented x 3 In NAD Cardiovascular: RRR nl S1 s2 Respiratory: CTA Abdomen: soft +BS NT/ND, no masses palpable Extremities: BL with dressing. Left hand with dressing.    Data  Reviewed: Basic Metabolic Panel:  Recent Labs Lab 08/25/13 0500 08/28/13 0455 08/29/13 0452 08/30/13 0540 08/31/13 0540  NA 134* 135 134* 138 137  K 4.5 4.0 3.8 3.5 3.4*  CL 101 96 94* 96 99  CO2 25 29 30 29 28   GLUCOSE 89 93 90 90 92  BUN 56* 57* 54* 55* 45*  CREATININE 2.80* 3.25* 3.23* 3.23* 2.85*  CALCIUM 8.0* 9.2 9.0 9.1 7.8*   CBC:  Recent Labs Lab 08/25/13 0500 08/28/13 0455  08/29/13 0452 08/30/13 0540 08/31/13 0540  WBC 14.2* 19.5* 17.6* 19.7* 22.6*  HGB 8.3* 8.1* 8.4* 8.8* 7.5*  HCT 24.8* 24.8* 26.0* 27.4* 23.3*  MCV 92.9 93.9 94.2 94.5 94.3  PLT 109* 227 277 329 323    CBG:  Recent Labs Lab 08/26/13 1205 08/26/13 1818 08/27/13 0023 08/27/13 0559 08/27/13 1143  GLUCAP 106* 124* 96 89 113*    No results found for this or any previous visit (from the past 240 hour(s)).   Studies: No results found.  Scheduled Meds: . chlorhexidine  15 mL Mouth/Throat BID  . ertapenem  1 g Intravenous Daily  . feeding supplement (ENSURE COMPLETE)  237 mL Oral BID BM  . metoprolol tartrate  25 mg Oral TID  . pantoprazole  40 mg Oral Q1200  . potassium chloride  20 mEq Oral Once   Continuous Infusions: . 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 If 7PM-7AM, please contact night-coverage at [email or web address], password [facility] 08/31/2013, 11:11 AM  LOS: 27 days

## 2013-08-31 NOTE — Progress Notes (Addendum)
[facility] [place]  Subjective  - pain greater in the hands than the feet this morning.   Objective 124/79 107 99 F (37.2 C) (Oral) 18 100%  Intake/Output Summary (Last 24 hours) at 08/31/13 0744 Last data filed at 08/31/13 0700  Gross per 24 hour  Intake 3020.16 ml  Output   2805 ml  Net 215.16 ml    Bilateral dressing on feet are clean and dry. Protective "boots" in place to protect heels on. Alert and oriented x 3  Assessment/Planning: Procedure(s):   1 Day Post-Op Dr. Thomasenia Bottoms amputation x5, left foot Right transmetatarsal amputation HGB 7.5 he may need another transfusion.  He is asymptomatic, no SOB, HR 100, SAT 92.  Will defer to medical MD.     Clinton [facility] 08/31/2013 7:44 AM --  Laboratory Lab Results:  Recent Labs  08/30/13 0540 08/31/13 0540  WBC 19.7* 22.6*  HGB 8.8* 7.5*  HCT 27.4* 23.3*  PLT 329 323   BMET  Recent Labs  08/30/13 0540 08/31/13 0540  NA 138 137  K 3.5 3.4*  CL 96 99  CO2 29 28  GLUCOSE 90 92  BUN 55* 45*  CREATININE 3.23* 2.85*  CALCIUM 9.1 7.8*    COAG Lab Results  Component Value Date   INR 1.19 08/14/2013   INR 1.36 08/08/2013   INR 1.55* 08/06/2013   No results found for this basename: PTT

## 2013-08-31 NOTE — Op Note (Signed)
NAMEFABRIZIO, Kline NO.:  [id number]  MEDICAL RECORD NO.:  [id number]  LOCATION:  [id number]                        FACILITY:  [facility]  PHYSICIAN:  Johnette Abraham, MD    DATE OF BIRTH:  11-Jun-1961  DATE OF PROCEDURE:  08/30/2013 DATE OF DISCHARGE:                              OPERATIVE REPORT   PREOPERATIVE DIAGNOSIS:  Ischemia to bilateral fingers.  POSTOPERATIVE DIAGNOSES: 1. Ischemia to bilateral fingers. 2. Dry gangrene to multiple fingers of bilateral hands.  PROCEDURE:  Multiple partial amputations, specifically the right index finger, the left thumb and the left index fingers, the left long finger, the left ring finger, and the left small finger.  INDICATIONS:  Mr. Timothy Kline is a male that was transferred in from an [facility] in severe sepsis and multisystem organ failure.  He was on pressors.  Had evidence of ischemia to the fingertips and toes. Vascular Surgery took him today for amputations of lower extremity parts.  I was consulted for definitive repair of his ischemic and dry gangrene fingers.  On examination, he had dry gangrene to the right index finger, small amount to the left long finger, small amount to the right thumb.  All the digits on the left hand had evidence of dry gangrene.  Risks, benefits, and alternatives of the surgery were discussed with the patient, he agreed to proceed with surgery.  DESCRIPTION OF PROCEDURE:  The patient was taken to the operating room, placed supine on the operating room table.  General anesthesia was administered without difficulty.  A time-out was performed.  The patient was on antibiotics and therefore no additional antibiotics were given. Starting on the right side, the hand and forearm were prepped and draped in a normal sterile fashion.  An Esmarch was used on the forearm for a tourniquet.  The right index finger, the edge of dry gangrene demarcation was evaluated.  Several mm proximal to this  demarcation line, a fishmouth incision was made over the middle phalanx.  Soft tissues were incised down to the bone.  The proximal soft tissues were retracted proximally and the bone was transected with a bone cutter. The neurovascular bundles were each pulled and cauterized, and then a local flap closure was then performed with multiple interrupted 5-0 nylon sutures.  After this, the Esmarch was released.  The edges returned to a nice pink color, and hemostasis was adequate.  A sterile dressing was applied.  The patient had a large amount of partial skin thickness burns and wounds on his dorsum of his hand and dorsal forearm. All of this was debrided back to nice healthy and __________ bleeding tissue.  Xeroform ointment and a sterile dressing were placed.  Next, attention was taken to the left side.  Again, the extremity was prepped and redraped in a sterile fashion.  This hand had the more extensive ischemia and dry gangrene changes.  Again, there were some wounds on the dorsum of the hand and distal forearm that were debrided.  This was partial thickness skin that was debrided back.  Following starting with the thumb, again the demarcation zone was found, demarcation zone was just proximal to the IP  joint.  The skin was cut in a fishmouth-type fashion.  The neurovascular bundles were cauterized so as the periosteum and soft tissues, and the wound was closed with nylon sutures.  The remaining index, long, ring, and small fingers were each evaluated similarly.  The gangrene changes were just proximal to the PIP joints, and therefore incisions were fashioned appropriately to incorporate all of the nonviable tissue.  The bone was transected and __________ rongeured of sharp edges.  All of the neurovascular bundles were isolated and retracted, and cauterized.  Each of these wounds were closed with multiple interrupted 5-0 sutures.  Afterwards, the Esmarch tourniquet was released from the  forearm.  Hemostasis was adequate, again nice pink color returned to all of the amputated parts or stubs.  Xeroform dressing was applied, as well as a sterile bulky dressing.  The patient tolerated the procedure well and was taken to the recovery room in stable condition.     Johnette Abraham, MD     HCC/MEDQ  D:  08/30/2013  T:  08/31/2013  Job:  [id number]

## 2013-09-01 LAB — CBC
Hemoglobin: 8.3 g/dL — ABNORMAL LOW (ref 13.0–17.0)
MCH: 31 pg (ref 26.0–34.0)
MCHC: 33.5 g/dL (ref 30.0–36.0)
MCV: 92.5 fL (ref 78.0–100.0)
RBC: 2.68 MIL/uL — ABNORMAL LOW (ref 4.22–5.81)
WBC: 22 10*3/uL — ABNORMAL HIGH (ref 4.0–10.5)

## 2013-09-01 LAB — BASIC METABOLIC PANEL
BUN: 46 mg/dL — ABNORMAL HIGH (ref 6–23)
CO2: 27 mEq/L (ref 19–32)
Calcium: 8.3 mg/dL — ABNORMAL LOW (ref 8.4–10.5)
Creatinine, Ser: 2.91 mg/dL — ABNORMAL HIGH (ref 0.50–1.35)
GFR calc non Af Amer: 23 mL/min — ABNORMAL LOW (ref >90)
Glucose, Bld: 96 mg/dL (ref 70–99)
Sodium: 137 mEq/L (ref 135–145)

## 2013-09-01 LAB — TYPE AND SCREEN
ABO/RH(D): O POS
Unit division: 0

## 2013-09-01 NOTE — Progress Notes (Signed)
[facility] PROGRESS NOTE  Timothy Kline ZOX:[id number] DOB: 09-30-1961 DOA: 08/04/2013 PCP: No primary provider on file.  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 -PT per surgery recommendation.   Acute renal failure  - CRRT started 10/31 transitioned to intermittent dialysis Last dialysis 11/9. - Renal followed and signed off on 11/12 - kidney function worse 11/24, stable 11/27. -Cr at 2.9. -hold on IV fluids due to heart failure.    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  - Patient's blood cultures from [place] grew Fusobacterium Necrophorum - he was placed on antibiotics with cefepime and vancomycin (had been on Rocephin/Levaquin/Vanc at [place]) - ID was consulted and has been following patient for antibiotic recommendations - He also required vasopressors and as he improved clinically and blood pressure stabilized this were discontinued - WBC still up today, afebrile>> ID recs as per 11/17 note>> invanz, and CVL is to be removed as it was put in while pt was bacteremic>> IR placed new tunnelled line 11/19, old CVL removed - needs 4 weeks of IV Invanz then Augmentin until CXR cavitary changes resolve. -WBC stable at 22. Repeat chest x ray improvement lung infection. If WBC continue to increase tomorrow will re-consult ID.   Multifocal cavitary pneumonia /Acute respiratory failure/ARDS - Patient was on the vent through11/10 and he was extubated, respiratory status remained stable. Abx as above.  Thrombocytopenia with coagulopathy - secondary to Sepsis and DIC - As treated with antibiotics as discussed above, hematology was consulted and followed - improving, platelets now  normal.  Anemia/Heme + stool; acute illness.  - Per GI his anemia is multifactorial and endoscopic evaluation would not change his clinical outcome at this time. -  no gross bleeding - continue to monitor hgb and transfuse as appropriate  -Will transfuse one unit PRBC 11-27. HB at 8.3  a-fib Flutter with RVR  - S/P10/31 Cardioversion for Afib/Flutter rate 170's ( Unsuccessful).  - also was treated with Amiodarone  for Afib/Flutter RVR no dc'ed - rate remained controlled on current metoprolol TID  Cardiomyopathy-Echocardiogram: LVEF 20-25%. 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  Hypokalemia  Replete. 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 He was admitted with Respiratory Failure (ARDS)/Sepsis and developed acute renal failure, thrombocytopenia, a-fib Flutter with RVR and was transferred to [facility] service here at [facility] on 10/31 for HD and further care.   SIGNIFICANT EVENTS / STUDIES:  10/28 Admission to [facility]  10/29 Metabolic Acidosis, pH=6.9  10/31 Transferred to [facility] for HD.  10/31 Cardioversion for Afib/Flutter rate 170's ( Unsuccessful). Amiodarone initiated for Afib/Flutter RVR  10/31 Renal  consult: CRRT initiated  10/31 Heme consult: TCP  likely due to DIC. Doubt TTP/HUS  11/01 Echocardiogram: LVEF 20-25%. Diffuse HK  11/01 ID consult  11/01 Skin biopsy: thrombotic vasculopathic reaction  11/2 tolerated HD, off pressors. Platelet transfusion for thrombocytopenia  11/3 CXR with new pulmonary opacities/nodules. Concern for septic emboli.  11/03 CT chest: Multilobar bilateral areas of pulmonary parenchymal nodular consolidation with internal necrosis/ cavitation and suggestion of surrounding hazy opacity. The appearance is most typical for septic emboli  11/03 CT head: NAD  11/03 BCx from [place] 2/2 + for FUSOBACTERIUM NECROPHORUM.  11/04 CT neck (noncontrasted): No evidence of jugular vein thrombosis, abscesses, inflammation.  11/05 Jugular vein [place]: No thrombus noted  11/04 Converted to NSR from Afib/Aflutter  11/05 Dexmedetomidine initiated in anticipation of extubation soon.  11/06 C Diff negative, Tracheal aspirate few yeast  11/06 Continued fevers, restart vanc for possible MRSA PNA  11/07 Sinus Tract R antecubital arm at site of previous punch bx discovered  11/08 MRI R forearm/elbow/humerus: inadequate study due to inability to cooperate  11/09 Debridement and lavage of RUE wound (Timothy Kline). No joint space infxn  11/09 Hypotension > norepi. Acute blood loss anemia (probably post op) > 2 units RBCs. Weaned off norepi after RBCs  11/09 Vasc surg consult for digital gangrene. No acute intervention, allow demarcation, possible amputation in future  11/10 Extubated, tolerated well  11/10 Pt in PAF despite amiodarone  11/11 1 u pRBC transfused  11/12 NSR, restart Metoprolol  11/13 FOBT +, Hgb to 6.6  11/14 Transfused 2 u pRBCs  11/14 GI consult for possible UGI vs LGI bleed   Code Status: partial Family Communication: brother at bedside.  Disposition Plan: pending hand/vascular surgical timing  Consultants:  GI - s/o  Vascular  ID - s/o  Renal - s/o  Heme/onc - s/o  Dermatology - s/o  cardiololgy -  s/o  Procedures: LINES / TUBES:  R IJ CVL 10/28 >>>   ETT 10/29 >> 11/10  10/31 Cardioversion for Afib/Flutter rate 170's ( Unsuccessful) R femoral HD Cath 11/01 >>>11/12 11/01 Echocardiogram: LVEF 20-25%. Diffuse HK 11/01 Skin biopsy: thrombotic vasculopathic reaction  11/09 Debridement and lavage of RUE wound (Timothy Kline). No joint space infxn  11/10 Extubated, tolerated well  CULTURES:  [facility] >> + Fusobacterium Necrophorum  BCx2 10/31>>> Negative  Sputum 10/31>> Few yeast  Tracheal Asp. 11/05>> Rare WBC, no organisms  C Diff 11/5 >> Negative  RUE wound 11/7 >> Negative   Antibiotics: Rocephin: 10/28?>>>10/31  Levaquin: 10/28>>>10/31  Vancomycin 10/28>>> 11/3  Cefepime 10/31>>> 11/3  Flagyl 11/2 >> 11/3  Meropenem 11/3 >> 11/17 Vancomycin 11/6 >> 11/13 Invanz 11/17 >>   HPI/Subjective: Feeling well. No significant cough.  No diarrhea.   Objective: Filed Vitals:   09/01/13 1400  BP: 127/66  Pulse: 88  Temp: 98.5 F (36.9 C)  Resp: 18    Intake/Output Summary (Last 24 hours) at 09/01/13 1437 Last data filed at 09/01/13 1320  Gross per 24 hour  Intake   2162 ml  Output   3401 ml  Net  -1239 ml   Filed Weights   08/30/13 0445 08/31/13 0500 09/01/13 0700  Weight: 72.2 kg (159 lb 2.8 oz) 78.2 kg (172 lb 6.4 oz) 77.747 kg (171 lb 6.4 oz)   Exam:  General: alert & oriented x 3 In NAD Cardiovascular: RRR nl S1 s2 Respiratory: CTA Abdomen: soft +BS NT/ND, no masses palpable Extremities: BL with dressing. Left hand with dressing.  Data Reviewed: Basic Metabolic Panel:  Recent Labs Lab 08/28/13 0455 08/29/13 0452 08/30/13 0540 08/31/13 0540 09/01/13 0525  NA 135 134* 138 137 137  K 4.0 3.8 3.5 3.4* 3.9  CL 96 94* 96 99 98  CO2 29 30 29 28 27   GLUCOSE 93 90 90 92 96  BUN 57* 54* 55* 45* 46*  CREATININE 3.25* 3.23* 3.23* 2.85* 2.91*  CALCIUM 9.2 9.0 9.1 7.8* 8.3*   CBC:  Recent Labs Lab 08/28/13 0455 08/29/13 0452 08/30/13 0540  08/31/13 0540 09/01/13 0525  WBC 19.5* 17.6* 19.7* 22.6* 22.0*  HGB 8.1* 8.4* 8.8* 7.5* 8.3*  HCT 24.8* 26.0* 27.4* 23.3* 24.8*  MCV 93.9 94.2 94.5 94.3 92.5  PLT 227 277 329 323 348    CBG:  Recent Labs Lab 08/26/13 1205 08/26/13 1818 08/27/13 0023 08/27/13 0559 08/27/13 1143  GLUCAP 106* 124* 96 89 113*    No results found for this or any previous visit (from the past 240 hour(s)).   Studies: Dg Chest 2 View  08/31/2013   CLINICAL DATA:  Leukocytosis.  Mild cough and possible congestion.  EXAM: CHEST  2 VIEW  COMPARISON:  08/22/2013 and chest CT 08/07/2013.  FINDINGS: Left IJ central venous catheter has tip overlying the SVC just below the level of the carina. Lungs are adequately inflated with continued patchy nodular airspace opacification bilaterally over the mid to lower lungs without significant change from 08/07/2013 but improved compared to 08/22/2013. Evidence of pleural fluid posteriorly on the lateral film. Cardiomediastinal silhouette and remainder of the exam is unchanged.  IMPRESSION: Continued nodular airspace process over the mid to lower lungs with improvement compared to 08/22/2013 likely representing ongoing infectious process. Small effusions.  Left IJ central venous catheter with tip overlying the SVC.   Electronically Signed   By: Elberta Fortis M.D.   On: 08/31/2013 13:09    Scheduled Meds: . chlorhexidine  15 mL Mouth/Throat BID  . docusate sodium  100 mg Oral BID  . ertapenem  1 g Intravenous Daily  . feeding supplement (ENSURE COMPLETE)  237 mL Oral BID BM  . metoprolol tartrate  25 mg Oral TID  . pantoprazole  40 mg Oral Q1200   Continuous Infusions: . 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 If 7PM-7AM, please contact night-coverage at [email or web address], password [facility] 09/01/2013, 2:37 PM  LOS: 28 days

## 2013-09-01 NOTE — Progress Notes (Signed)
Patient in surgery today. CSW will follow up.  Lorri Frederick. [place]  [phone]

## 2013-09-01 NOTE — Progress Notes (Signed)
Orthopedic Tech Progress Note Patient Details:  Timothy Kline 05/15/1961 [id number] Bilateral Darco shoes fitted for size; patient to remain in space boots while in bed. PT to apply darco shoes for walking. Space boots reapplies, darco shoes left in room.  Ortho Devices Type of Ortho Device: Darco shoe Ortho Device/Splint Location: Bilateral Ortho Device/Splint Interventions: Application;Ordered   [place] 09/01/2013, 12:52 PM

## 2013-09-01 NOTE — Progress Notes (Signed)
CSW met with patient, brother and sister-in-law at bedside. Patient states he is "coming along" post surgery. CSW discussed post hospitalization needs and rehab stay. Patient and family open to rehab stay as patient resides alone and family limited with ability to assist as they work. Patient prefers facility in [facility] as it would be closer to home and family. Brother very supportive and attentive to patient at bedside. Patient also asked about disability and CSW provided number for Social Security to begin application process. Patient verbalizes understanding of visit and CSW will begin search for rehab options. FL-2 will be updated today and faxed to [facility]. Facilities. BC/BS authorization will be initiated. They are closed today. Lasandra Beech, LCSW [phone]

## 2013-09-01 NOTE — Clinical Social Work Psychosocial (Addendum)
    Clinical Social Work Department BRIEF PSYCHOSOCIAL ASSESSMENT 09/01/2013  Patient:  KAMALI, NEPHEW     Account Number:  [id number]     Admit date:  08/04/2013  Clinical Social Worker:  Tiburcio Pea  Date/Time:  08/25/2013 03:35 PM  Referred by:  Physician  Date Referred:  08/25/2013 Referred for  SNF Placement   Other Referral:   SNF vs home with [facility]   Interview type:  Patient Other interview type:    PSYCHOSOCIAL DATA Living Status:  ALONE Admitted from facility:   Level of care:   Primary support name:  [employer]  [phone] Primary support relationship to patient:  CHILD, ADULT Degree of support available:   Strong support    CURRENT CONCERNS Current Concerns  Other - See comment   Other Concerns:   Wants to go home but CSW anticipates need for SNF placement.    SOCIAL WORK ASSESSMENT / PLAN 52 year old male who has been at [facility] since 08/04/13. Patient has severe gangrene of his left hand and several digits on right hand; also has necrotic toes both feet. He is prepared to undergo surgery for removal of his fingers and toes. CSW attempted to talk to patient about future d/c needs as he lives alone. Patient relates that he was fully independent prior to this hospitalization and he was working full time.  He does not understand what happened to bring him into the hospital and he "doesn't remember" anything for many days after he was admitted to the hospital. CSW spoke wiht patient about recent use of heroin. He appeared to minimalize its use and stated that he only used occasionally for the past 6 months. He was unable to state when was last use.  "I don't remember". Patient denies desire to seek treatment or counseling and that he is not planning to use anymore.  Physical Therapy is recommending CIR but CSW is unsure if patient would qualify. He also has commercial  [employer] which will require authorization prior to disposition. Discussed possible  SNF placement and he defers at this time stating that he will plan to go home wiht family support. CSW requested to talk to his family and arrange a meeting to discuss but patient defers at this time.  CSW will continue to follow and assist as needed as SNF placement is anticipated.   Assessment/plan status:  Psychosocial Support/Ongoing Assessment of Needs Other assessment/ plan:   Information/referral to community resources:   SNF list offered but deferred    PATIENT'S/FAMILY'S RESPONSE TO PLAN OF CARE: Patient is alert and appears oriented. Very pleasant gentleman who was appreciitive of CSW's visit.  He does not agree to SNF referral at this time and plans ot go home with family.  CSW feels sstrongly that SNF placement will be anticipated and will support patient in this process in attempt to get him to place of acceptance of need for rehab.  Lorri Frederick. Brendt Dible, LCSWA  [phone]

## 2013-09-01 NOTE — Progress Notes (Signed)
Subjective  - POD #2  C/o pain at incision site, both feet   Physical Exam:  Dressings changed today.  Amputation site looks good       Assessment/Plan:  POD #2  Wounds look very good. Darco shoe ordered today.  Patient can be weight bearing on both heels  BRABHAM IV, V. WELLS 09/01/2013 2:43 PM --  Filed Vitals:   09/01/13 1400  BP: 127/66  Pulse: 88  Temp: 98.5 F (36.9 C)  Resp: 18    Intake/Output Summary (Last 24 hours) at 09/01/13 1443 Last data filed at 09/01/13 1320  Gross per 24 hour  Intake   2162 ml  Output   3401 ml  Net  -1239 ml     Laboratory CBC    Component Value Date/Time   WBC 22.0* 09/01/2013 0525   HGB 8.3* 09/01/2013 0525   HCT 24.8* 09/01/2013 0525   PLT 348 09/01/2013 0525    BMET    Component Value Date/Time   NA 137 09/01/2013 0525   K 3.9 09/01/2013 0525   CL 98 09/01/2013 0525   CO2 27 09/01/2013 0525   GLUCOSE 96 09/01/2013 0525   BUN 46* 09/01/2013 0525   CREATININE 2.91* 09/01/2013 0525   CALCIUM 8.3* 09/01/2013 0525   GFRNONAA 23* 09/01/2013 0525   GFRAA 27* 09/01/2013 0525    COAG Lab Results  Component Value Date   INR 1.19 08/14/2013   INR 1.36 08/08/2013   INR 1.55* 08/06/2013   No results found for this basename: PTT    Antibiotics Anti-infectives   Start     Dose/Rate Route Frequency Ordered Stop   08/23/13 1000  ertapenem (INVANZ) 1 g in sodium chloride 0.9 % 50 mL IVPB  Status:  Discontinued     1 g 100 mL/hr over 30 Minutes Intravenous Every 24 hours 08/22/13 1017 08/22/13 1034   08/22/13 1200  ertapenem (INVANZ) 1 g in sodium chloride 0.9 % 50 mL IVPB  Status:  Discontinued     1 g 100 mL/hr over 30 Minutes Intravenous Every 24 hours 08/22/13 1034 08/22/13 1047   08/22/13 1100  ertapenem (INVANZ) 1 g in sodium chloride 0.9 % 50 mL IVPB     1 g 100 mL/hr over 30 Minutes Intravenous Daily 08/22/13 1047     08/15/13 0800  vancomycin (VANCOCIN) IVPB 1000 mg/200 mL premix  Status:   Discontinued     1,000 mg 200 mL/hr over 60 Minutes Intravenous Every 24 hours 08/15/13 0744 08/17/13 0931   08/15/13 0800  meropenem (MERREM) 500 mg in sodium chloride 0.9 % 50 mL IVPB  Status:  Discontinued     500 mg 100 mL/hr over 30 Minutes Intravenous Every 12 hours 08/15/13 0749 08/22/13 1017   08/12/13 1637  polymyxin B 500,000 Units, bacitracin 50,000 Units in sodium chloride irrigation 0.9 % 500 mL irrigation  Status:  Discontinued       As needed 08/12/13 1638 08/12/13 1703   08/12/13 1300  vancomycin (VANCOCIN) IVPB 1000 mg/200 mL premix     1,000 mg 200 mL/hr over 60 Minutes Intravenous  Once 08/12/13 1147 08/12/13 1441   08/11/13 1830  meropenem (MERREM) 1 g in sodium chloride 0.9 % 100 mL IVPB  Status:  Discontinued     1 g 200 mL/hr over 30 Minutes Intravenous Every 24 hours 08/11/13 1145 08/15/13 0749   08/11/13 1100  vancomycin (VANCOCIN) IVPB 750 mg/150 ml premix     750 mg 150 mL/hr  over 60 Minutes Intravenous  Once 08/11/13 1026 08/11/13 1321   08/10/13 1745  vancomycin (VANCOCIN) IVPB 750 mg/150 ml premix     750 mg 150 mL/hr over 60 Minutes Intravenous  Once 08/10/13 1734 08/10/13 1920   08/07/13 1830  meropenem (MERREM) 500 mg in sodium chloride 0.9 % 50 mL IVPB  Status:  Discontinued     500 mg 100 mL/hr over 30 Minutes Intravenous Every 24 hours 08/07/13 1745 08/11/13 1145   08/06/13 2000  vancomycin (VANCOCIN) IVPB 750 mg/150 ml premix     750 mg 150 mL/hr over 60 Minutes Intravenous  Once 08/06/13 1056 08/07/13 0330   08/05/13 2200  ceFEPIme (MAXIPIME) 1 g in dextrose 5 % 50 mL IVPB  Status:  Discontinued     1 g 100 mL/hr over 30 Minutes Intravenous Every 24 hours 08/04/13 1815 08/07/13 1656   08/05/13 1200  metroNIDAZOLE (FLAGYL) IVPB 500 mg  Status:  Discontinued     500 mg 100 mL/hr over 60 Minutes Intravenous Every 8 hours 08/05/13 1049 08/07/13 1656   08/04/13 1630  ceFEPIme (MAXIPIME) 2 g in dextrose 5 % 50 mL IVPB     2 g 100 mL/hr over 30 Minutes  Intravenous  Once 08/04/13 1604 08/04/13 1747       V. Charlena Cross, M.D. [facility] [place] Office: [phone] Pager:  [phone]

## 2013-09-01 NOTE — Progress Notes (Signed)
PT Cancellation Note  Patient Details Name: Timothy Kline MRN: [id number] DOB: 1960/11/23   Cancelled Treatment:    Reason Eval/Treat Not Completed: Other (comment) (need bil UE and bil LE weight bearing and restrictions post-op. MD, please advise.)   Toney Sang [facility] 09/01/2013, 7:17 AM Delaney Meigs, PT [phone]

## 2013-09-01 NOTE — Clinical Social Work Placement (Addendum)
    Clinical Social Work Department CLINICAL SOCIAL WORK PLACEMENT NOTE 09/17/2013  Patient:  UNKNOWN, SCHLEYER  Account Number:  [id number] Admit date:  08/04/2013  Clinical Social Worker:  Lupita Leash Shuna Tabor, LCSWA  Date/time:  09/01/2013 03:47 PM  Clinical Social Work is seeking post-discharge placement for this patient at the following level of care:   SKILLED NURSING   (*CSW will update this form in Epic as items are completed)   09/01/2013  Patient/family provided with [facility] Department of Clinical Social Work's list of facilities offering this level of care within the geographic area requested by the patient (or if unable, by the patient's family).  09/01/2013  Patient/family informed of their freedom to choose among providers that offer the needed level of care, that participate in Medicare, Medicaid or managed care program needed by the patient, have an available bed and are willing to accept the patient.  09/01/2013  Patient/family informed of [facility]' ownership interest in [facility], as well as of the fact that they are under no obligation to receive care at this facility.  PASARR submitted to [employer] on 09/04/2013 PASARR number received from [employer] on 09/04/2013  FL2 transmitted to all facilities in geographic area requested by pt/family on  09/01/2013 FL2 transmitted to all facilities within larger geographic area on   Patient informed that his/her managed care company has contracts with or will negotiate with  certain facilities, including the following:   [employer] in Provider Link     Patient/family informed of bed offers received:  09/04/2013 Patient chooses bed at [facility] Physician recommends and patient chooses bed at    Patient to be transferred to [facility] on  09/07/2013 Patient to be transferred to facility by Ambulance Sharin Mons)  The following physician request were entered in  Epic:   Additional Comments: 09/07/13  Patient d/c'd to SNF today- both patient and family had hoped for patient to go to [facility] but was unable to obtain authorization for this.  Patient and family was dissappointed but agreeable to SNF and were pleased to have located placement close to home. Nursing called in report. No further CSW needs identified.  Lorri Frederick. Floraine Buechler, LCSWA [phone]

## 2013-09-02 LAB — BASIC METABOLIC PANEL
CO2: 28 mEq/L (ref 19–32)
Calcium: 8.6 mg/dL (ref 8.4–10.5)
Creatinine, Ser: 2.6 mg/dL — ABNORMAL HIGH (ref 0.50–1.35)
GFR calc Af Amer: 31 mL/min — ABNORMAL LOW (ref >90)
GFR calc non Af Amer: 27 mL/min — ABNORMAL LOW (ref >90)
Sodium: 135 mEq/L (ref 135–145)

## 2013-09-02 LAB — CBC
MCH: 30.8 pg (ref 26.0–34.0)
MCHC: 32.9 g/dL (ref 30.0–36.0)
MCV: 93.5 fL (ref 78.0–100.0)
Platelets: 423 10*3/uL — ABNORMAL HIGH (ref 150–400)
RBC: 2.76 MIL/uL — ABNORMAL LOW (ref 4.22–5.81)
RDW: 15 % (ref 11.5–15.5)

## 2013-09-02 NOTE — Progress Notes (Signed)
Subjective: Interval History: none.. comfortable with minimal pain and feet   Objective: Vital signs in last 24 hours: Temp:  [97.9 F (36.6 C)-98.5 F (36.9 C)] 98.2 F (36.8 C) (11/29 0634) Pulse Rate:  [79-88] 79 (11/29 0634) Resp:  [16-18] 18 (11/29 0634) BP: (118-130)/(66-83) 126/82 mmHg (11/29 0634) SpO2:  [94 %-98 %] 98 % (11/29 0634) Weight:  [156 lb 12 oz (71.1 kg)] 156 lb 12 oz (71.1 kg) (11/29 0634)  Intake/Output from previous day: 11/28 0701 - 11/29 0700 In: 1592 [P.O.:1592] Out: 6600 [Urine:6600] Intake/Output this shift: Total I/O In: 220 [P.O.:220] Out: -   Dressings were removed.Surgeon tact with excellent healing bilaterally.  Lab Results:  Recent Labs  09/01/13 0525 09/02/13 0647  WBC 22.0* 21.5*  HGB 8.3* 8.5*  HCT 24.8* 25.8*  PLT 348 423*   BMET  Recent Labs  09/01/13 0525 09/02/13 0647  NA 137 135  K 3.9 3.5  CL 98 96  CO2 27 28  GLUCOSE 96 90  BUN 46* 48*  CREATININE 2.91* 2.60*  CALCIUM 8.3* 8.6    Studies/Results: Dg Chest 2 View  08/31/2013   CLINICAL DATA:  Leukocytosis.  Mild cough and possible congestion.  EXAM: CHEST  2 VIEW  COMPARISON:  08/22/2013 and chest CT 08/07/2013.  FINDINGS: Left IJ central venous catheter has tip overlying the SVC just below the level of the carina. Lungs are adequately inflated with continued patchy nodular airspace opacification bilaterally over the mid to lower lungs without significant change from 08/07/2013 but improved compared to 08/22/2013. Evidence of pleural fluid posteriorly on the lateral film. Cardiomediastinal silhouette and remainder of the exam is unchanged.  IMPRESSION: Continued nodular airspace process over the mid to lower lungs with improvement compared to 08/22/2013 likely representing ongoing infectious process. Small effusions.  Left IJ central venous catheter with tip overlying the SVC.   Electronically Signed   By: Elberta Fortis M.D.   On: 08/31/2013 13:09   Ct Head Wo  Contrast  08/07/2013   CLINICAL DATA:  Subsequent admission, patient difficulty breathing  EXAM: CT HEAD WITHOUT CONTRAST  TECHNIQUE: Contiguous axial images were obtained from the base of the skull through the vertex without intravenous contrast.  COMPARISON:  CT HEAD W/O CM dated 08/02/2013  FINDINGS: Patient intubated. No acute intracranial hemorrhage. No focal mass lesion. No CT evidence of acute infarction. No midline shift or mass effect. No hydrocephalus. Basilar cisterns are patent. Small fluid in the left mastoid air cells. Paranasal sinuses are clear. Frontal sinuses clear.  IMPRESSION: 1. No acute intracranial findings. No change from prior.  2. Small of fluid in the left mastoid air cells is new.   Electronically Signed   By: Genevive Bi M.D.   On: 08/07/2013 16:06   Ct Soft Tissue Neck Wo Contrast  08/08/2013   CLINICAL DATA:  Sepsis. Acute renal failure.  Rule out neck abscess.  EXAM: CT NECK WITHOUT CONTRAST  TECHNIQUE: Multidetector CT imaging of the neck was performed following the standard protocol without intravenous contrast.  COMPARISON:  None.  FINDINGS: Lack of intravenous contrast to the limitation of this examination.  Right jugular central venous catheter is noted extending into the right innominate vein with tip not visualized. The right jugular vein does not appear distended or ill-defined however the study cannot exclude venous thrombosis without intravenous contrast. Ultrasound of the neck may be helpful to evaluate for jugular vein thrombosis. Superior mediastinum does not show any mass or fluid collection.  Negative for  fluid collection or abscess in the neck. No mass lesion is identified. No edema in the neck.  The patient is intubated. NG tube is in place.  Mild cervical degenerative changes. No acute bony change in the cervical spine. Extensive dental infection is noted with multiple caries and bony changes of chronic dental infection.  IMPRESSION: Right Jugular central  venous catheter. No definite evidence of jugular venous thrombosis however this study is limited to evaluate for venous thrombosis without intravenous contrast. Consider followup neck ultrasound.  Negative for mass, edema, or abscess in the neck.   Electronically Signed   By: Marlan Palau M.D.   On: 08/08/2013 15:29   Ct Chest Wo Contrast  08/07/2013   CLINICAL DATA:  Sepsis, shortness of breath, pulmonary masses on prior exam. History of hepatitis-C and acute respiratory failure.  EXAM: CT CHEST WITHOUT CONTRAST  TECHNIQUE: Multidetector CT imaging of the chest was performed following the standard protocol without IV contrast.  COMPARISON:  08/07/2013 chest radiograph. No prior chest CT for comparison.  FINDINGS: Streak artifact from the patient's arms is present. Endotracheal tube is appropriately positioned. The nasogastric tube terminates below the level of the diaphragms. Right IJ approach central line tip terminates within the upper SVC. Mild cardiomegaly noted. No significant pericardial effusion. Trace pleural effusions are noted bilaterally. Incomplete imaging of the upper abdomen demonstrates a small amount of abdominal ascites. Great vessels are normal in caliber.  Dense the predominantly bilateral lower lobe confluent pulmonary parenchymal consolidation is noted with air bronchogram formation. There are patchy areas of multi focal bilateral airspace consolidation with internal cavitation. Imaging is degraded by respiratory motion. Allowing for this, there is a subjective suggestion of hazy parenchymal opacification surrounding these necrotic/cavitary lesions. These are predominantly peripheral in location.  No acute osseous abnormality. Healing left inferior posterior rib fractures are noted, incompletely imaged.  IMPRESSION: Multilobar bilateral areas of pulmonary parenchymal nodular consolidation with internal necrosis/ cavitation and suggestion of surrounding hazy opacity. The appearance is most  typical for septic emboli, focal necrotizing pneumonia including atypical agents such as fungal infection, or less likely bland infarct. Superimposed bilateral lower lobe consolidation may indicate pneumonia and or aspiration.   Electronically Signed   By: Christiana Pellant M.D.   On: 08/07/2013 15:54   Ir Fluoro Guide Cv Line Left  08/23/2013   INDICATION: 52 year old with sepsis and renal insufficiency.  EXAM: FLUOROSCOPIC AND ULTRASOUND GUIDED PLACEMENT OF A TUNNELED CENTRAL VENOUS CATHETER  Physician: Rachelle Hora. Henn, MD  MEDICATIONS: None  ANESTHESIA/SEDATION: None  FLUOROSCOPY TIME:  2 min and 12 seconds  PROCEDURE: Informed consent was obtained for placement of a tunneled central vena catheter. The patient was placed supine on the interventional table. Ultrasound confirmed a patent left internal jugularvein. Patient already had a right jugular catheter in place. Ultrasound images were obtained for documentation. The left side of the neck was prepped and draped in a sterile fashion. The left side of the neck was anesthetized with 1% lidocaine. Maximal barrier sterile technique was utilized including caps, mask, sterile gowns, sterile gloves, sterile drape, hand hygiene and skin antiseptic. A small incision was made with #11 blade scalpel. A 21 gauge needle directed into the left internal jugular vein with ultrasound guidance. A micropuncture dilator set was placed. A dual lumen Powerline catheter was selected. The skin below the left clavicle was anesthetized and a small incision was made with an #11 blade scalpel. A subcutaneous tunnel was formed to the vein dermatotomy site. The catheter was  brought through the tunnel. The cuff was placed underneath the skin. The vein dermatotomy site was dilated to accommodate a peel-away sheath. The catheter was placed through the peel-away sheath and directed into the central venous structures. The tip of the catheter was placed in the lower SVC with fluoroscopy.  Fluoroscopic images were obtained for documentation. Both lumens were found to aspirate and flush well. The vein dermatotomy site was closed using Dermabond. The catheter was secured to the skin using Prolene suture.  COMPLICATIONS: None  FINDINGS: Catheter tip in the lower SVC.  IMPRESSION: Successful placement of a tunneled central venous catheter using ultrasound and fluoroscopic guidance.   Electronically Signed   By: Richarda Overlie M.D.   On: 08/23/2013 12:19   Ir US Guide Vasc Access Left  08/23/2013   INDICATION: 52 year old with sepsis and renal insufficiency.  EXAM: FLUOROSCOPIC AND ULTRASOUND GUIDED PLACEMENT OF A TUNNELED CENTRAL VENOUS CATHETER  Physician: Rachelle Hora. Henn, MD  MEDICATIONS: None  ANESTHESIA/SEDATION: None  FLUOROSCOPY TIME:  2 min and 12 seconds  PROCEDURE: Informed consent was obtained for placement of a tunneled central vena catheter. The patient was placed supine on the interventional table. Ultrasound confirmed a patent left internal jugularvein. Patient already had a right jugular catheter in place. Ultrasound images were obtained for documentation. The left side of the neck was prepped and draped in a sterile fashion. The left side of the neck was anesthetized with 1% lidocaine. Maximal barrier sterile technique was utilized including caps, mask, sterile gowns, sterile gloves, sterile drape, hand hygiene and skin antiseptic. A small incision was made with #11 blade scalpel. A 21 gauge needle directed into the left internal jugular vein with ultrasound guidance. A micropuncture dilator set was placed. A dual lumen Powerline catheter was selected. The skin below the left clavicle was anesthetized and a small incision was made with an #11 blade scalpel. A subcutaneous tunnel was formed to the vein dermatotomy site. The catheter was brought through the tunnel. The cuff was placed underneath the skin. The vein dermatotomy site was dilated to accommodate a peel-away sheath. The catheter was  placed through the peel-away sheath and directed into the central venous structures. The tip of the catheter was placed in the lower SVC with fluoroscopy. Fluoroscopic images were obtained for documentation. Both lumens were found to aspirate and flush well. The vein dermatotomy site was closed using Dermabond. The catheter was secured to the skin using Prolene suture.  COMPLICATIONS: None  FINDINGS: Catheter tip in the lower SVC.  IMPRESSION: Successful placement of a tunneled central venous catheter using ultrasound and fluoroscopic guidance.   Electronically Signed   By: Richarda Overlie M.D.   On: 08/23/2013 12:19   Dg Chest Port 1 View  08/22/2013   CLINICAL DATA:  Check the position of central line.  EXAM: PORTABLE CHEST - 1 VIEW  COMPARISON:  08/15/2013, 08/14/2013.  FINDINGS: There is a right-sided jugular central venous catheter with the tip projecting over the SVC IJ confluence. Marland Kitchen Heart size and mediastinum are stable.  There are bilateral nodular airspace opacities primarily involving the lower lungs with some of these opacities demonstrating cavitation. The overall appearance is most consistent with septic emboli.  IMPRESSION: Right-sided jugular central venous catheter with the tip projecting over the SVC IJV confluence.  Bilateral nodular airspace opacities again noted in the lower lungs most consistent with septic emboli.   Electronically Signed   By: Elige Ko   On: 08/22/2013 09:19   Dg Chest  Port 1 View  08/15/2013   CLINICAL DATA:  Evaluate pulmonary edema  EXAM: PORTABLE CHEST - 1 VIEW  COMPARISON:  08/14/2013; 08/10/2013; 08/07/2013; chest CT -08/07/2013  FINDINGS: Grossly unchanged cardiac silhouette and mediastinal contours. Interval extubation and removal of enteric tube. Otherwise, stable positioning of remaining support apparatus. No pneumothorax. Grossly unchanged bilateral mid and lower lung heterogeneous airspace opacities 1 of which within the periphery of the right lower lung  appears to demonstrate central cavitation. No new focal airspace opacities. Suspect a trace left-sided effusion. Unchanged bones, including sequela of old right clavicular fracture.  IMPRESSION: 1. Interval extubation and removal of enteric tube. Otherwise, stable position of support apparatus. No pneumothorax. 2. Grossly unchanged bilateral mid and lower lung predominant nodular airspace opacities again worrisome for septic emboli.   Electronically Signed   By: Simonne Come M.D.   On: 08/15/2013 07:50   Dg Chest Port 1 View  08/14/2013   CLINICAL DATA:  Respiratory failure.  EXAM: PORTABLE CHEST - 1 VIEW  COMPARISON:  Chest radiograph 08/10/2013.  FINDINGS: ET tube terminates 2.2 cm superior to the carina. NG tube tip and side-port course inferior to the diaphragm, tip not included on this examination. Right IJ central venous catheter tip projects over the superior vena cava. Stable cardiac and mediastinal contours. No significant interval change in multiple bilateral nodular pulmonary opacities. No definite pleural effusion or pneumothorax.  IMPRESSION: ET tube terminates 2.2 cm superior to the carina  Chest radiograph with unchanged bilateral nodular pulmonary opacities, potentially representing multi focal septic emboli.   Electronically Signed   By: Annia Belt M.D.   On: 08/14/2013 07:34   Dg Chest Port 1 View  08/10/2013   CLINICAL DATA:  Respiratory failure.  EXAM: PORTABLE CHEST - 1 VIEW  COMPARISON:  Chest x-ray 08/07/2013.  FINDINGS: An endotracheal tube is in place with tip 2.8 cm above the carina. There is a right-sided internal jugular central venous catheter with tip terminating in the mid superior vena cava. A nasogastric tube is seen extending into the stomach, however, the tip of the nasogastric tube extends below the lower margin of the image. Lung volumes have improved slightly, now near normal. Multifocal nodular opacities are again noted throughout the mid to lower lungs bilaterally, some  of which appear cavitary. No definite pleural effusions decreasing airspace consolidation throughout the left lower lobe. No evidence of pulmonary edema. Heart size is normal. Mediastinal contours are unremarkable.  IMPRESSION: 1. Support apparatus, as above. 2. Improving aeration in the left lower lobe, compatible with resolving airspace consolidation. 3. Persistent multifocal nodular opacities (some of which are cavitary) throughout the mid to lower lungs bilaterally, likely to reflect multifocal septic emboli based on comparison with recent chest CT.   Electronically Signed   By: Trudie Reed M.D.   On: 08/10/2013 06:44   Dg Chest Port 1 View  08/07/2013   CLINICAL DATA:  Evaluate endotracheal tube placement.  EXAM: PORTABLE CHEST - 1 VIEW  COMPARISON:  CHEST x-ray 08/06/2013.  FINDINGS: An endotracheal tube is in place with tip 4.0 cm above the carina. There is a right-sided internal jugular central venous catheter with tip terminating in the mid superior vena cava. A nasogastric tube is seen extending into the stomach, however, the tip of the nasogastric tube extends below the lower margin of the image. Lung volumes are normal. Dense opacification at the base of the left hemithorax may reflect atelectasis and/or consolidation. Patchy nodular opacities are noted throughout the mida  to lower lungs bilaterally. Possible trace left pleural effusion. No evidence of pulmonary edema. Heart size is borderline enlarged. Mediastinal contours are unremarkable.  IMPRESSION: 1. Support apparatus, as above. 2. Persistent atelectasis and/or consolidation in the left lower lobe. 3. Multifocal nodular opacities throughout the mid to lower lungs bilaterally. These are new compared to recent prior examinations dating back to 08/02/2013, and are therefore favored to be of infectious or inflammatory etiology.   Electronically Signed   By: Trudie Reed M.D.   On: 08/07/2013 05:45   Dg Chest Port 1 View  08/06/2013    CLINICAL DATA:  Endotracheal tube placement.  EXAM: PORTABLE CHEST - 1 VIEW  COMPARISON:  Chest radiograph August 05, 2013  FINDINGS: Endotracheal tube tip projects 3.1 cm above the equina. Right internal jugular central venous catheter with distal tip projecting in proximal superior vena cava. Nasogastric tube appears looped in proximal stomach, distal tip not imaged at least past the proximal duodenum.  The cardiac silhouette appears at least mildly numb to moderately enlarged, even with consideration to this low inspiratory portable examination with crowded vasculature markings. Interstitial prominence with decreasing patchy alveolar airspace opacities. Improved, small residual pleural effusions. No pneumothorax. Multiple EKG lines overlie the patient and may obscure subtle underlying pathology. Warming blanket overlies the patient.  IMPRESSION: Endotracheal tube tip now projects 3.1 cm above the carinal, no apparent change in remaining life support lines.  Stable cardiomegaly with interstitial and decreasing alveolar airspace opacities favoring confluent edema and/ or pneumonia with decreased, minimal residual pleural effusions.   Electronically Signed   By: Awilda Metro   On: 08/06/2013 04:55   Dg Chest Port 1 View  08/05/2013   CLINICAL DATA:  Evaluate endotracheal tube.  EXAM: PORTABLE CHEST - 1 VIEW  COMPARISON:  08/04/2013  FINDINGS: Endotracheal tube is 7.7 cm above the carina. There are cardiac pads on the chest. Right jugular central venous catheter in the SVC region. Increased densities in the right lower lung are suggestive for increased edema or pleural fluid. Heart size is stable. Nasogastric tube extends into the abdomen.  IMPRESSION: There are increased densities in the right lower chest. Findings may represent worsening edema and/or pleural fluid.  Support apparatuses as described.   Electronically Signed   By: Richarda Overlie M.D.   On: 08/05/2013 07:33   Dg Chest Port 1 View  08/04/2013    CLINICAL DATA:  Check endotracheal tube  EXAM: PORTABLE CHEST - 1 VIEW  COMPARISON:  08/04/2013 1536 hrs  FINDINGS: The cardiac shadow is stable. Patchy infiltrative changes are again identified bilaterally. A right-sided central venous line, nasogastric catheter and endotracheal tube are again seen and stable. The endotracheal tube again lies approximately 6.4 cm above the carinal. No other focal abnormality is seen.  IMPRESSION: Stable appearance of tubes and lines as described.  Patchy infiltrates bilaterally stable from the previous film   Electronically Signed   By: Alcide Clever M.D.   On: 08/04/2013 17:56   Dg Chest Port 1 View  08/04/2013   CLINICAL DATA:  Respiratory difficulty  EXAM: PORTABLE CHEST - 1 VIEW  COMPARISON:  08/04/2013  FINDINGS: Endotracheal tube, NG tube, right internal jugular central venous catheter are stable. The endotracheal tube remains 6.4 cm from the carinal. Extensive patchy bilateral airspace disease is not significantly changed. No pneumothorax.  IMPRESSION: Stable bilateral airspace disease.   Electronically Signed   By: Maryclare Bean M.D.   On: 08/04/2013 15:45   Dg Abd Portable 1v  08/20/2013   CLINICAL DATA:  Generalized abdominal pain  EXAM: PORTABLE ABDOMEN - 1 VIEW  COMPARISON:  None.  FINDINGS: The bowel gas pattern is normal. No radio-opaque calculi or other significant radiographic abnormality are seen. Mild degradation of image quality due to patient motion. Right lung base opacity partly visualized. Presence or absence of air-fluid levels or free air is suboptimally evaluated on this supine projection.  IMPRESSION: Negative.   Electronically Signed   By: Christiana Pellant M.D.   On: 08/20/2013 15:27   Anti-infectives: Anti-infectives   Start     Dose/Rate Route Frequency Ordered Stop   08/23/13 1000  ertapenem (INVANZ) 1 g in sodium chloride 0.9 % 50 mL IVPB  Status:  Discontinued     1 g 100 mL/hr over 30 Minutes Intravenous Every 24 hours 08/22/13 1017  08/22/13 1034   08/22/13 1200  ertapenem (INVANZ) 1 g in sodium chloride 0.9 % 50 mL IVPB  Status:  Discontinued     1 g 100 mL/hr over 30 Minutes Intravenous Every 24 hours 08/22/13 1034 08/22/13 1047   08/22/13 1100  ertapenem (INVANZ) 1 g in sodium chloride 0.9 % 50 mL IVPB     1 g 100 mL/hr over 30 Minutes Intravenous Daily 08/22/13 1047     08/15/13 0800  vancomycin (VANCOCIN) IVPB 1000 mg/200 mL premix  Status:  Discontinued     1,000 mg 200 mL/hr over 60 Minutes Intravenous Every 24 hours 08/15/13 0744 08/17/13 0931   08/15/13 0800  meropenem (MERREM) 500 mg in sodium chloride 0.9 % 50 mL IVPB  Status:  Discontinued     500 mg 100 mL/hr over 30 Minutes Intravenous Every 12 hours 08/15/13 0749 08/22/13 1017   08/12/13 1637  polymyxin B 500,000 Units, bacitracin 50,000 Units in sodium chloride irrigation 0.9 % 500 mL irrigation  Status:  Discontinued       As needed 08/12/13 1638 08/12/13 1703   08/12/13 1300  vancomycin (VANCOCIN) IVPB 1000 mg/200 mL premix     1,000 mg 200 mL/hr over 60 Minutes Intravenous  Once 08/12/13 1147 08/12/13 1441   08/11/13 1830  meropenem (MERREM) 1 g in sodium chloride 0.9 % 100 mL IVPB  Status:  Discontinued     1 g 200 mL/hr over 30 Minutes Intravenous Every 24 hours 08/11/13 1145 08/15/13 0749   08/11/13 1100  vancomycin (VANCOCIN) IVPB 750 mg/150 ml premix     750 mg 150 mL/hr over 60 Minutes Intravenous  Once 08/11/13 1026 08/11/13 1321   08/10/13 1745  vancomycin (VANCOCIN) IVPB 750 mg/150 ml premix     750 mg 150 mL/hr over 60 Minutes Intravenous  Once 08/10/13 1734 08/10/13 1920   08/07/13 1830  meropenem (MERREM) 500 mg in sodium chloride 0.9 % 50 mL IVPB  Status:  Discontinued     500 mg 100 mL/hr over 30 Minutes Intravenous Every 24 hours 08/07/13 1745 08/11/13 1145   08/06/13 2000  vancomycin (VANCOCIN) IVPB 750 mg/150 ml premix     750 mg 150 mL/hr over 60 Minutes Intravenous  Once 08/06/13 1056 08/07/13 0330   08/05/13 2200  ceFEPIme  (MAXIPIME) 1 g in dextrose 5 % 50 mL IVPB  Status:  Discontinued     1 g 100 mL/hr over 30 Minutes Intravenous Every 24 hours 08/04/13 1815 08/07/13 1656   08/05/13 1200  metroNIDAZOLE (FLAGYL) IVPB 500 mg  Status:  Discontinued     500 mg 100 mL/hr over 60 Minutes Intravenous Every 8 hours  08/05/13 1049 08/07/13 1656   08/04/13 1630  ceFEPIme (MAXIPIME) 2 g in dextrose 5 % 50 mL IVPB     2 g 100 mL/hr over 30 Minutes Intravenous  Once 08/04/13 1604 08/04/13 1747      Assessment/Plan: s/p Procedure(s): Right Index Finger Partial Amputation (Right) Left Hand All Fingers Partial Amputations (Left) 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 We'll see again next week   LOS: 29 days   Timothy Kline 09/02/2013, 8:52 AM

## 2013-09-02 NOTE — Progress Notes (Signed)
[facility] PROGRESS NOTE  Timothy Kline ZOX:[id number] DOB: 04-27-1961 DOA: 08/04/2013 PCP: No primary provider on file.  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 -PT per surgery recommendation.   Acute renal failure  - CRRT started 10/31 transitioned to intermittent dialysis Last dialysis 11/9. - Renal followed and signed off on 11/12 - kidney function worse 11/24, stable 11/27. -Cr at 2.6. Stable. -hold on IV fluids due to heart failure.    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  - Patient's blood cultures from [place] grew Fusobacterium Necrophorum - he was placed on antibiotics with cefepime and vancomycin (had been on Rocephin/Levaquin/Vanc at [place]) - ID was consulted and has been following patient for antibiotic recommendations - He also required vasopressors and as he improved clinically and blood pressure stabilized this were discontinued - WBC still up today, afebrile>> ID recs as per 11/17 note>> invanz, and CVL is to be removed as it was put in while pt was bacteremic>> IR placed new tunnelled line 11/19, old CVL removed - needs 4 weeks of IV Invanz then Augmentin until CXR cavitary changes resolve.  -WBC slightly decrease to 21. Repeat chest x ray improvement lung infection.   Multifocal cavitary pneumonia /Acute respiratory failure/ARDS - Patient was on the vent through11/10 and he was extubated, respiratory status remained stable. Abx as above.  Thrombocytopenia with coagulopathy - secondary to Sepsis and DIC - As treated with antibiotics as discussed above, hematology was consulted and followed - improving, platelets now normal.  Anemia/Heme + stool; acute  illness.  - Per GI his anemia is multifactorial and endoscopic evaluation would not change his clinical outcome at this time. -  no gross bleeding - continue to monitor hgb and transfuse as appropriate  -S/P  one unit PRBC transfusion  11-27. HB at 8.5  a-fib Flutter with RVR  - S/P10/31 Cardioversion for Afib/Flutter rate 170's ( Unsuccessful).  - also was treated with Amiodarone  for Afib/Flutter RVR no dc'ed - rate remained controlled on current metoprolol TID  Cardiomyopathy-Echocardiogram: LVEF 20-25%. - With increased peripheral edema noted, patient was followed an volume managed with dialysis per renal through 11/9 - Not on diuretics as this has been limited by Hypotension(now resolved) and poor renal function-improving but Cr still elevated>>see as discussed below  Hypernatremia  - resolved.     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  Rash/Thrombotic vasculopathic reaction -s/p Skin biopsy per derm on 11/1; thrombotic vasculopathic reaction.  Diarrhea -C Diff negative11/06  -resolved.   Hypokalemia  Replete.  cocaine /oxycodone use Hepatitis C Lemierre syndrome  Brief Narrative: Pt is a 52 year old male seen initially at [facility] 10/28 with AMS/Sepsis in setting of cocaine /oxycodone use. He was admitted with Respiratory Failure (ARDS)/Sepsis and developed acute renal failure, thrombocytopenia, a-fib Flutter with RVR and was transferred to [facility] service here at [facility] on 10/31 for HD and further care.   SIGNIFICANT EVENTS / STUDIES:  10/28 Admission to [facility]  10/29 Metabolic Acidosis, pH=6.9  10/31 Transferred to [facility] for HD.  10/31 Cardioversion for Afib/Flutter rate 170's ( Unsuccessful). Amiodarone initiated for Afib/Flutter RVR  10/31 Renal  consult: CRRT initiated  10/31 Heme consult: TCP likely due to DIC. Doubt  TTP/HUS  11/01 Echocardiogram: LVEF 20-25%. Diffuse HK  11/01 ID consult  11/01 Skin biopsy: thrombotic vasculopathic reaction  11/2 tolerated HD, off pressors. Platelet transfusion for thrombocytopenia  11/3 CXR with new pulmonary opacities/nodules. Concern for septic emboli.  11/03 CT chest: Multilobar bilateral areas of pulmonary parenchymal nodular consolidation with internal necrosis/ cavitation and suggestion of surrounding hazy opacity. The appearance is most typical for septic emboli  11/03 CT head: NAD  11/03 BCx from [place] 2/2 + for FUSOBACTERIUM NECROPHORUM.  11/04 CT neck (noncontrasted): No evidence of jugular vein thrombosis, abscesses, inflammation.  11/05 Jugular vein [place]: No thrombus noted  11/04 Converted to NSR from Afib/Aflutter  11/05 Dexmedetomidine initiated in anticipation of extubation soon.  11/06 C Diff negative, Tracheal aspirate few yeast  11/06 Continued fevers, restart vanc for possible MRSA PNA  11/07 Sinus Tract R antecubital arm at site of previous punch bx discovered  11/08 MRI R forearm/elbow/humerus: inadequate study due to inability to cooperate  11/09 Debridement and lavage of RUE wound (Coley). No joint space infxn  11/09 Hypotension > norepi. Acute blood loss anemia (probably post op) > 2 units RBCs. Weaned off norepi after RBCs  11/09 Vasc surg consult for digital gangrene. No acute intervention, allow demarcation, possible amputation in future  11/10 Extubated, tolerated well  11/10 Pt in PAF despite amiodarone  11/11 1 u pRBC transfused  11/12 NSR, restart Metoprolol  11/13 FOBT +, Hgb to 6.6  11/14 Transfused 2 u pRBCs  11/14 GI consult for possible UGI vs LGI bleed   Code Status: partial Family Communication: brother at bedside.  Disposition Plan: SNF when ok by vascular and if WBC continue to decreases.   Consultants:  GI - s/o  Vascular  ID - s/o  Renal - s/o  Heme/onc - s/o  Dermatology - s/o  cardiololgy -  s/o  Procedures: LINES / TUBES:  R IJ CVL 10/28 >>>   ETT 10/29 >> 11/10  10/31 Cardioversion for Afib/Flutter rate 170's ( Unsuccessful) R femoral HD Cath 11/01 >>>11/12 11/01 Echocardiogram: LVEF 20-25%. Diffuse HK 11/01 Skin biopsy: thrombotic vasculopathic reaction  11/09 Debridement and lavage of RUE wound (Coley). No joint space infxn  11/10 Extubated, tolerated well  CULTURES:  [facility] >> + Fusobacterium Necrophorum  BCx2 10/31>>> Negative  Sputum 10/31>> Few yeast  Tracheal Asp. 11/05>> Rare WBC, no organisms  C Diff 11/5 >> Negative  RUE wound 11/7 >> Negative   Antibiotics: Rocephin: 10/28?>>>10/31  Levaquin: 10/28>>>10/31  Vancomycin 10/28>>> 11/3  Cefepime 10/31>>> 11/3  Flagyl 11/2 >> 11/3  Meropenem 11/3 >> 11/17 Vancomycin 11/6 >> 11/13 Invanz 11/17 >>   HPI/Subjective: Feeling well. No significant cough.  No diarrhea.   Objective: Filed Vitals:   09/02/13 0634  BP: 126/82  Pulse: 79  Temp: 98.2 F (36.8 C)  Resp: 18    Intake/Output Summary (Last 24 hours) at 09/02/13 1341 Last data filed at 09/02/13 1152  Gross per 24 hour  Intake   1112 ml  Output   4100 ml  Net  -2988 ml   Filed Weights   08/31/13 0500 09/01/13 0700 09/02/13 0634  Weight: 78.2 kg (172 lb 6.4 oz) 77.747 kg (171 lb 6.4 oz) 71.1 kg (156 lb 12 oz)   Exam:  General: alert & oriented x 3 In NAD Cardiovascular: RRR nl S1 s2 Respiratory: CTA Abdomen: soft +BS NT/ND, no masses palpable Extremities: hand  with dressing.    Data Reviewed: Basic Metabolic Panel:  Recent Labs Lab 08/29/13 0452 08/30/13 0540 08/31/13 0540 09/01/13 0525 09/02/13 0647  NA 134* 138 137 137 135  K 3.8 3.5 3.4* 3.9 3.5  CL 94* 96 99 98 96  CO2 30 29 28 27 28   GLUCOSE 90 90 92 96 90  BUN 54* 55* 45* 46* 48*  CREATININE 3.23* 3.23* 2.85* 2.91* 2.60*  CALCIUM 9.0 9.1 7.8* 8.3* 8.6   CBC:  Recent Labs Lab 08/29/13 0452 08/30/13 0540 08/31/13 0540 09/01/13 0525  09/02/13 0647  WBC 17.6* 19.7* 22.6* 22.0* 21.5*  HGB 8.4* 8.8* 7.5* 8.3* 8.5*  HCT 26.0* 27.4* 23.3* 24.8* 25.8*  MCV 94.2 94.5 94.3 92.5 93.5  PLT 277 329 323 348 423*    CBG:  Recent Labs Lab 08/26/13 1818 08/27/13 0023 08/27/13 0559 08/27/13 1143  GLUCAP 124* 96 89 113*    No results found for this or any previous visit (from the past 240 hour(s)).   Studies: No results found.  Scheduled Meds: . chlorhexidine  15 mL Mouth/Throat BID  . docusate sodium  100 mg Oral BID  . ertapenem  1 g Intravenous Daily  . feeding supplement (ENSURE COMPLETE)  237 mL Oral BID BM  . metoprolol tartrate  25 mg Oral TID  . pantoprazole  40 mg Oral Q1200   Continuous Infusions: . 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 If 7PM-7AM, please contact night-coverage at [email or web address], password [facility] 09/02/2013, 1:41 PM  LOS: 29 days

## 2013-09-03 LAB — CBC
HCT: 25.6 % — ABNORMAL LOW (ref 39.0–52.0)
MCV: 93.1 fL (ref 78.0–100.0)
Platelets: 498 10*3/uL — ABNORMAL HIGH (ref 150–400)
RBC: 2.75 MIL/uL — ABNORMAL LOW (ref 4.22–5.81)
RDW: 15 % (ref 11.5–15.5)
WBC: 20.6 10*3/uL — ABNORMAL HIGH (ref 4.0–10.5)

## 2013-09-03 NOTE — Progress Notes (Signed)
[facility] PROGRESS NOTE  Aryaman Haliburton XLK:[id number] DOB: [birth year] DOA: 08/04/2013 PCP: No primary provider on file.  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 -PT per surgery recommendation.   Acute renal failure  - CRRT started 10/31 transitioned to intermittent dialysis Last dialysis 11/9. - Renal followed and signed off on 11/12 - kidney function worse 11/24, stable 11/27. -Cr at 2.6. Stable. -hold on IV fluids due to heart failure.    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  - Patient's blood cultures from [place] grew Fusobacterium Necrophorum - he was placed on antibiotics with cefepime and vancomycin (had been on Rocephin/Levaquin/Vanc at [place]) - ID was consulted and has been following patient for antibiotic recommendations - He also required vasopressors and as he improved clinically and blood pressure stabilized this were discontinued - WBC still up today, afebrile>> ID recs as per 11/17 note>> invanz, and CVL is to be removed as it was put in while pt was bacteremic>> IR placed new tunnelled line 11/19, old CVL removed - needs 4 weeks of IV Invanz then Augmentin until CXR cavitary changes resolve.  -WBC pending for today. Repeat chest x ray improvement lung infection.   Multifocal cavitary pneumonia /Acute respiratory failure/ARDS - Patient was on the vent through11/10 and he was extubated, respiratory status remained stable. Abx as above.  Thrombocytopenia with coagulopathy - secondary to Sepsis and DIC - As treated with antibiotics as discussed above, hematology was consulted and followed - improving, platelets now normal.  Anemia/Heme + stool; acute  illness.  - Per GI his anemia is multifactorial and endoscopic evaluation would not change his clinical outcome at this time. -  no gross bleeding - continue to monitor hgb and transfuse as appropriate  -S/P  one unit PRBC transfusion  11-27. HB at 8.5  a-fib Flutter with RVR  - S/P10/31 Cardioversion for Afib/Flutter rate 170's ( Unsuccessful).  - also was treated with Amiodarone  for Afib/Flutter RVR no dc'ed - rate remained controlled on current metoprolol TID  Cardiomyopathy-Echocardiogram: LVEF 20-25%. - With increased peripheral edema noted, patient was followed an volume managed with dialysis per renal through 11/9 - Not on diuretics as this has been limited by Hypotension(now resolved) and poor renal function-improving but Cr still elevated>>see as discussed below  Hypernatremia  - resolved.     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  Rash/Thrombotic vasculopathic reaction -s/p Skin biopsy per derm on 11/1; thrombotic vasculopathic reaction.  Diarrhea -C Diff negative11/06  -resolved.   Hypokalemia  Replete.  cocaine /oxycodone use Hepatitis C Lemierre syndrome  Brief Narrative: Pt is a 52 year old male seen initially at [facility] 10/28 with AMS/Sepsis in setting of cocaine /oxycodone use. He was admitted with Respiratory Failure (ARDS)/Sepsis and developed acute renal failure, thrombocytopenia, a-fib Flutter with RVR and was transferred to [facility] service here at [facility] on 10/31 for HD and further care.   SIGNIFICANT EVENTS / STUDIES:  10/28 Admission to [facility]  10/29 Metabolic Acidosis, pH=6.9  10/31 Transferred to [facility] for HD.  10/31 Cardioversion for Afib/Flutter rate 170's ( Unsuccessful). Amiodarone initiated for Afib/Flutter RVR  10/31 Renal consult:  CRRT initiated  10/31 Heme consult: TCP likely due to DIC. Doubt  TTP/HUS  11/01 Echocardiogram: LVEF 20-25%. Diffuse HK  11/01 ID consult  11/01 Skin biopsy: thrombotic vasculopathic reaction  11/2 tolerated HD, off pressors. Platelet transfusion for thrombocytopenia  11/3 CXR with new pulmonary opacities/nodules. Concern for septic emboli.  11/03 CT chest: Multilobar bilateral areas of pulmonary parenchymal nodular consolidation with internal necrosis/ cavitation and suggestion of surrounding hazy opacity. The appearance is most typical for septic emboli  11/03 CT head: NAD  11/03 BCx from [place] 2/2 + for FUSOBACTERIUM NECROPHORUM.  11/04 CT neck (noncontrasted): No evidence of jugular vein thrombosis, abscesses, inflammation.  11/05 Jugular vein [place]: No thrombus noted  11/04 Converted to NSR from Afib/Aflutter  11/05 Dexmedetomidine initiated in anticipation of extubation soon.  11/06 C Diff negative, Tracheal aspirate few yeast  11/06 Continued fevers, restart vanc for possible MRSA PNA  11/07 Sinus Tract R antecubital arm at site of previous punch bx discovered  11/08 MRI R forearm/elbow/humerus: inadequate study due to inability to cooperate  11/09 Debridement and lavage of RUE wound (Coley). No joint space infxn  11/09 Hypotension > norepi. Acute blood loss anemia (probably post op) > 2 units RBCs. Weaned off norepi after RBCs  11/09 Vasc surg consult for digital gangrene. No acute intervention, allow demarcation, possible amputation in future  11/10 Extubated, tolerated well  11/10 Pt in PAF despite amiodarone  11/11 1 u pRBC transfused  11/12 NSR, restart Metoprolol  11/13 FOBT +, Hgb to 6.6  11/14 Transfused 2 u pRBCs  11/14 GI consult for possible UGI vs LGI bleed   Code Status: partial Family Communication: brother at bedside.  Disposition Plan: SNF when ok by vascular and if WBC continue to decreases.   Consultants:  GI - s/o  Vascular  ID - s/o  Renal - s/o  Heme/onc - s/o  Dermatology - s/o  cardiololgy -  s/o  Procedures: LINES / TUBES:  R IJ CVL 10/28 >>>   ETT 10/29 >> 11/10  10/31 Cardioversion for Afib/Flutter rate 170's ( Unsuccessful) R femoral HD Cath 11/01 >>>11/12 11/01 Echocardiogram: LVEF 20-25%. Diffuse HK 11/01 Skin biopsy: thrombotic vasculopathic reaction  11/09 Debridement and lavage of RUE wound (Coley). No joint space infxn  11/10 Extubated, tolerated well  CULTURES:  [facility] >> + Fusobacterium Necrophorum  BCx2 10/31>>> Negative  Sputum 10/31>> Few yeast  Tracheal Asp. 11/05>> Rare WBC, no organisms  C Diff 11/5 >> Negative  RUE wound 11/7 >> Negative   Antibiotics: Rocephin: 10/28?>>>10/31  Levaquin: 10/28>>>10/31  Vancomycin 10/28>>> 11/3  Cefepime 10/31>>> 11/3  Flagyl 11/2 >> 11/3  Meropenem 11/3 >> 11/17 Vancomycin 11/6 >> 11/13 Invanz 11/17 >>   HPI/Subjective: Feeling well. No significant cough.  No diarrhea.   Objective: Filed Vitals:   09/03/13 0500  BP: 120/83  Pulse: 80  Temp: 97.8 F (36.6 C)  Resp: 18    Intake/Output Summary (Last 24 hours) at 09/03/13 1408 Last data filed at 09/03/13 1004  Gross per 24 hour  Intake   1480 ml  Output   3950 ml  Net  -2470 ml   Filed Weights   09/01/13 0700 09/02/13 0634 09/03/13 0500  Weight: 77.747 kg (171 lb 6.4 oz) 71.1 kg (156 lb 12 oz) 72 kg (158 lb 11.7 oz)   Exam:  General: alert & oriented x 3 In NAD Cardiovascular: RRR nl S1 s2 Respiratory: CTA Abdomen: soft +BS NT/ND, no masses palpable Extremities: hand with  dressing. LE with dressing.    Data Reviewed: Basic Metabolic Panel:  Recent Labs Lab 08/29/13 0452 08/30/13 0540 08/31/13 0540 09/01/13 0525 09/02/13 0647  NA 134* 138 137 137 135  K 3.8 3.5 3.4* 3.9 3.5  CL 94* 96 99 98 96  CO2 30 29 28 27 28   GLUCOSE 90 90 92 96 90  BUN 54* 55* 45* 46* 48*  CREATININE 3.23* 3.23* 2.85* 2.91* 2.60*  CALCIUM 9.0 9.1 7.8* 8.3* 8.6   CBC:  Recent Labs Lab 08/29/13 0452 08/30/13 0540 08/31/13 0540  09/01/13 0525 09/02/13 0647  WBC 17.6* 19.7* 22.6* 22.0* 21.5*  HGB 8.4* 8.8* 7.5* 8.3* 8.5*  HCT 26.0* 27.4* 23.3* 24.8* 25.8*  MCV 94.2 94.5 94.3 92.5 93.5  PLT 277 329 323 348 423*    CBG: No results found for this basename: GLUCAP,  in the last 168 hours  No results found for this or any previous visit (from the past 240 hour(s)).   Studies: No results found.  Scheduled Meds: . chlorhexidine  15 mL Mouth/Throat BID  . docusate sodium  100 mg Oral BID  . ertapenem  1 g Intravenous Daily  . feeding supplement (ENSURE COMPLETE)  237 mL Oral BID BM  . metoprolol tartrate  25 mg Oral TID  . pantoprazole  40 mg Oral Q1200   Continuous Infusions: . 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 If 7PM-7AM, please contact night-coverage at [email or web address], password [facility] 09/03/2013, 2:08 PM  LOS: 30 days

## 2013-09-04 ENCOUNTER — Encounter (HOSPITAL_COMMUNITY): Payer: Self-pay | Admitting: Surgery

## 2013-09-04 LAB — CBC
HCT: 27.2 % — ABNORMAL LOW (ref 39.0–52.0)
Hemoglobin: 9 g/dL — ABNORMAL LOW (ref 13.0–17.0)
MCH: 30.9 pg (ref 26.0–34.0)
MCHC: 33.1 g/dL (ref 30.0–36.0)
MCV: 93.5 fL (ref 78.0–100.0)
Platelets: 540 K/uL — ABNORMAL HIGH (ref 150–400)
RBC: 2.91 MIL/uL — ABNORMAL LOW (ref 4.22–5.81)
RDW: 14.8 % (ref 11.5–15.5)
WBC: 19.4 K/uL — ABNORMAL HIGH (ref 4.0–10.5)

## 2013-09-04 LAB — BASIC METABOLIC PANEL
BUN: 41 mg/dL — ABNORMAL HIGH (ref 6–23)
CO2: 29 mEq/L (ref 19–32)
Chloride: 98 mEq/L (ref 96–112)
Creatinine, Ser: 2.26 mg/dL — ABNORMAL HIGH (ref 0.50–1.35)
GFR calc Af Amer: 37 mL/min — ABNORMAL LOW (ref >90)
Sodium: 137 mEq/L (ref 135–145)

## 2013-09-04 NOTE — Progress Notes (Signed)
Occupational Therapy Treatment Patient Details Name: Timothy Kline MRN: [id number] DOB: 1960-11-22 Today's Date: 09/04/2013 Time: 4098-1191 OT Time Calculation (min): 22 min  OT Assessment / Plan / Recommendation  History of present illness 52 year old male presented to [facility] 10/28 with AMS/Sepsis in setting of cocaine /oxycodone use. Pt admitted with Respiratory Failure (ARDS)/Sepsis has developed acute renal failure, thrombocytopenia, a-fib Flutter with RVR and was transferred to [facility] on 10/31 for HD and further care. 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   OT comments  Pt making progress with functional goals and is highly motivated to participate in therapy. Pt able to complete simple grooming while seated with set up/sup and feed self with set up and using high sided bowl. Pt requires 2 person assist for mobility and fatigues easily. Pt to continue with acute OT services to increase level of function and safety. Recommend CIR consult as pt was independent prior to hospitalization/surigeries and is highly motivated to return to PLOF  Follow Up Recommendations  CIR    Barriers to Discharge       Equipment Recommendations  3 in 1 bedside comode;Other (comment) (TBD)    Recommendations for Other Services Rehab consult  Frequency Min 2X/week   Progress towards OT Goals Progress towards OT goals: Progressing toward goals  Plan Discharge plan needs to be updated    Precautions / Restrictions Precautions Precautions: Fall Precaution Comments: NWB B hands Restrictions Weight Bearing Restrictions: Yes RUE Weight Bearing: Non weight  bearing (hand) LUE Weight Bearing: Non weight bearing (hand) Other Position/Activity Restrictions: no bil LE restrictions per Dr.Brabham   Pertinent Vitals/Pain 7/10 back pain upon after sitting EOB    ADL  Eating/Feeding: Supervision/safety;Set up Where Assessed - Eating/Feeding: Chair Grooming: Performed;Wash/dry hands;Wash/dry face;Supervision/safety;Set up Where Assessed - Grooming: Unsupported sitting ADL Comments: pt required set up of food items on tray, total A to open containers/comdiments. Pt was able to use R hand to feed self with spoon and fork using high sided bowl without pyhisical assist    OT Diagnosis:    OT Problem List:   OT Treatment Interventions:     OT Goals(current goals can now be found in the care plan section) Acute Rehab OT Goals Patient Stated Goal: to get stronger and get OOB  Visit Information  Last OT Received On: 09/04/13 Assistance Needed: +2 PT/OT Co-Evaluation/Treatment: Yes History of Present Illness: 52 year old male presented to [facility] 10/28 with AMS/Sepsis in setting of cocaine /oxycodone use. Pt admitted with Respiratory Failure (ARDS)/Sepsis has developed acute renal failure, thrombocytopenia, a-fib Flutter with RVR and was transferred to [facility] on 10/31 for HD and further care. 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    Subjective Data      Prior Functioning       Cognition  Cognition Arousal/Alertness: Awake/alert Behavior During  Therapy: WFL for tasks assessed/performed Overall Cognitive Status: Within Functional Limits for tasks assessed    Mobility  Bed Mobility Bed  Mobility: Supine to Sit;Sitting - Scoot to Edge of Bed Supine to Sit: 4: Min guard;HOB elevated Sitting - Scoot to [place] of Bed: 4: Min guard Details for Bed Mobility Assistance: cues for sequencing and NWB of B hands Transfers Transfers: Sit to Stand;Stand to Sit Sit to Stand: 1: +2 Total assist;From bed;From elevated surface Sit to Stand: Patient Percentage: 50% Stand to Sit: To chair/3-in-1;1: +2 Total assist Stand to Sit: Patient Percentage: 60% Details for Transfer Assistance: used B UE platform RW; cues for safety, sequencing and directions    Exercises  General Exercises - Upper Extremity Shoulder Flexion: AROM;Both;5 reps;Seated Shoulder Extension: AROM;Both;5 reps;Seated Shoulder ABduction: AROM;5 reps;Both;Seated Elbow Flexion: AROM;Both;5 reps;Seated Elbow Extension: AROM;Both;5 reps;Seated   Balance Balance Balance Assessed: Yes Dynamic Sitting Balance Dynamic Sitting - Balance Support: No upper extremity supported;During functional activity Dynamic Sitting - Level of Assistance: 5: Stand by assistance   End of Session OT - End of Session Equipment Utilized During Treatment: Gait belt;Rolling walker;Other (comment) (platform RW), B [place] shoes Activity Tolerance: Patient limited by fatigue Patient left: with call bell/phone within reach;in chair;with family/visitor present  GO     Timothy Kline 09/04/2013, 1:10 PM

## 2013-09-04 NOTE — Progress Notes (Signed)
[facility] PROGRESS NOTE  Timothy Kline ZOX:[id number] DOB: 01/30/1961 DOA: 08/04/2013 PCP: No primary provider on file.  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 -CIR consulted.  -Clear by Timothy Kline to be discharge.  -Patient needs to follow up with Timothy Kline next week.  Timothy Kline recommend to continue with dressing of left hand unless get wet. ( If it get wet, need to have dressing changes (wash with soup and water, apply ointment antibiotics and keep them cover).   Acute renal failure  - CRRT started 10/31 transitioned to intermittent dialysis Last dialysis 11/9. - Renal followed and signed off on 11/12 -Cr at 2.2. Stable. -hold on IV fluids due to heart failure.    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  - Patient's blood cultures from [place] grew Fusobacterium Necrophorum - he was placed on antibiotics with cefepime and vancomycin (had been on Rocephin/Levaquin/Vanc at [facility]) - ID was consulted and has been following patient for antibiotic recommendations - He also required vasopressors and as he improved clinically and blood pressure stabilized this were discontinued - WBC still up today, afebrile>> ID recs as per 11/17 note>> invanz, and CVL is to be removed as it was put in while pt was bacteremic>> IR placed new tunnelled line 11/19, old CVL removed -Discussed case with Timothy Kline due to persistent of elevated WBC, will continue with IV INVAZ for 2 more weeks. Patient needs to follow up with ID clinic within 2 week. He will probably need to be on oral Augmentin after IV antibiotics. Need very close follow up with ID clinic.  -WBC trending down today at 19. Repeat chest x  ray improvement lung infection.   Multifocal cavitary pneumonia /Acute respiratory failure/ARDS - Patient was on the vent through11/10 and he was extubated, respiratory status remained stable. Abx as above.  Thrombocytopenia with coagulopathy - Secondary to Sepsis and DIC. - As treated with antibiotics as discussed above, hematology was consulted and followed - improving, platelets now normal.  Anemia/Heme + stool; acute illness.  - Per GI his anemia is multifactorial and endoscopic evaluation would not change his clinical outcome at this time. -  no gross bleeding - continue to monitor hgb and transfuse as appropriate  -S/P  one unit PRBC transfusion  11-27. HB at 9  a-fib Flutter with RVR  - S/P10/31 Cardioversion for Afib/Flutter rate 170's ( Unsuccessful).  - also was treated with Amiodarone  for Afib/Flutter RVR no dc'ed - rate remained controlled on current metoprolol TID  Cardiomyopathy-Echocardiogram: LVEF 20-25%. - With increased peripheral edema noted, patient was followed an volume managed with dialysis per renal through 11/9 - Not on diuretics as this has been limited by Hypotension (now resolved) and poor renal function-improving but Cr still elevated>>see as discussed below  Hypernatremia  - resolved.     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  Rash/Thrombotic vasculopathic reaction -s/p Skin biopsy per derm on 11/1; thrombotic vasculopathic reaction.  Diarrhea -C Diff negative11/06  -resolved.   Hypokalemia  Replete.  cocaine /oxycodone use Hepatitis C Lemierre syndrome  Brief Narrative: Pt is a 52 year old male seen initially at  [facility] 10/28 with AMS/Sepsis in setting of cocaine /oxycodone use. He was admitted with Respiratory Failure (ARDS)/Sepsis and developed acute renal failure, thrombocytopenia, a-fib  Flutter with RVR and was transferred to [facility] service here at [facility] on 10/31 for HD and further care.   SIGNIFICANT EVENTS / STUDIES:  10/28 Admission to [facility]  10/29 Metabolic Acidosis, pH=6.9  10/31 Transferred to [facility] for HD.  10/31 Cardioversion for Afib/Flutter rate 170's ( Unsuccessful). Amiodarone initiated for Afib/Flutter RVR  10/31 Renal consult: CRRT initiated  10/31 Heme consult: TCP likely due to DIC. Doubt TTP/HUS  11/01 Echocardiogram: LVEF 20-25%. Diffuse HK  11/01 ID consult  11/01 Skin biopsy: thrombotic vasculopathic reaction  11/2 tolerated HD, off pressors. Platelet transfusion for thrombocytopenia  11/3 CXR with new pulmonary opacities/nodules. Concern for septic emboli.  11/03 CT chest: Multilobar bilateral areas of pulmonary parenchymal nodular consolidation with internal necrosis/ cavitation and suggestion of surrounding hazy opacity. The appearance is most typical for septic emboli  11/03 CT head: NAD  11/03 BCx from [place] 2/2 + for FUSOBACTERIUM NECROPHORUM.  11/04 CT neck (noncontrasted): No evidence of jugular vein thrombosis, abscesses, inflammation.  11/05 Jugular vein [place]: No thrombus noted  11/04 Converted to NSR from Afib/Aflutter  11/05 Dexmedetomidine initiated in anticipation of extubation soon.  11/06 C Diff negative, Tracheal aspirate few yeast  11/06 Continued fevers, restart vanc for possible MRSA PNA  11/07 Sinus Tract R antecubital arm at site of previous punch bx discovered  11/08 MRI R forearm/elbow/humerus: inadequate study due to inability to cooperate  11/09 Debridement and lavage of RUE wound (Coley). No joint space infxn  11/09 Hypotension > norepi. Acute blood loss anemia (probably post op) > 2 units RBCs. Weaned off norepi after RBCs  11/09 Vasc surg consult for digital gangrene. No acute intervention, allow demarcation, possible amputation in future  11/10 Extubated, tolerated well  11/10 Pt in PAF despite amiodarone  11/11 1  u pRBC transfused  11/12 NSR, restart Metoprolol  11/13 FOBT +, Hgb to 6.6  11/14 Transfused 2 u pRBCs  11/14 GI consult for possible UGI vs LGI bleed   Code Status: partial Family Communication: brother at bedside.  Disposition Plan: CIR consulted.   Consultants:  GI - s/o  Vascular  ID - s/o  Renal - s/o  Heme/onc - s/o  Dermatology - s/o  cardiololgy - s/o  Procedures: LINES / TUBES:  R IJ CVL 10/28 >>>   ETT 10/29 >> 11/10  10/31 Cardioversion for Afib/Flutter rate 170's ( Unsuccessful) R femoral HD Cath 11/01 >>>11/12 11/01 Echocardiogram: LVEF 20-25%. Diffuse HK 11/01 Skin biopsy: thrombotic vasculopathic reaction  11/09 Debridement and lavage of RUE wound (Coley). No joint space infxn  11/10 Extubated, tolerated well  CULTURES:  [facility] >> + Fusobacterium Necrophorum  BCx2 10/31>>> Negative  Sputum 10/31>> Few yeast  Tracheal Asp. 11/05>> Rare WBC, no organisms  C Diff 11/5 >> Negative  RUE wound 11/7 >> Negative   Antibiotics: Rocephin: 10/28?>>>10/31  Levaquin: 10/28>>>10/31  Vancomycin 10/28>>> 11/3  Cefepime 10/31>>> 11/3  Flagyl 11/2 >> 11/3  Meropenem 11/3 >> 11/17 Vancomycin 11/6 >> 11/13 Invanz 11/17 >>   HPI/Subjective: Feeling well. No significant complaints. Mild left hands pain.   Objective: Filed Vitals:   09/04/13 1056  BP: 112/72  Pulse: 80  Temp:   Resp:     Intake/Output Summary (Last 24 hours) at 09/04/13 1337 Last data filed at 09/04/13 0842  Gross per 24 hour  Intake  1500 ml  Output   4275 ml  Net  -2775 ml   Filed Weights   09/01/13 0700 09/02/13 0634 09/03/13 0500  Weight: 77.747 kg (171 lb 6.4 oz) 71.1 kg (156 lb 12 oz) 72 kg (158 lb 11.7 oz)   Exam:  General: alert & oriented x 3 In NAD Cardiovascular: RRR nl S1 s2 Respiratory: CTA Abdomen: soft +BS NT/ND, no masses palpable Extremities: hand with dressing. LE with dressing.    Data Reviewed: Basic Metabolic Panel:  Recent Labs Lab  08/30/13 0540 08/31/13 0540 09/01/13 0525 09/02/13 0647 09/04/13 0500  NA 138 137 137 135 137  K 3.5 3.4* 3.9 3.5 3.7  CL 96 99 98 96 98  CO2 29 28 27 28 29   GLUCOSE 90 92 96 90 93  BUN 55* 45* 46* 48* 41*  CREATININE 3.23* 2.85* 2.91* 2.60* 2.26*  CALCIUM 9.1 7.8* 8.3* 8.6 8.6   CBC:  Recent Labs Lab 08/31/13 0540 09/01/13 0525 09/02/13 0647 09/03/13 1805 09/04/13 0500  WBC 22.6* 22.0* 21.5* 20.6* 19.4*  HGB 7.5* 8.3* 8.5* 8.5* 9.0*  HCT 23.3* 24.8* 25.8* 25.6* 27.2*  MCV 94.3 92.5 93.5 93.1 93.5  PLT 323 348 423* 498* 540*    CBG: No results found for this basename: GLUCAP,  in the last 168 hours  No results found for this or any previous visit (from the past 240 hour(s)).   Studies: No results found.  Scheduled Meds: . chlorhexidine  15 mL Mouth/Throat BID  . docusate sodium  100 mg Oral BID  . ertapenem  1 g Intravenous Daily  . feeding supplement (ENSURE COMPLETE)  237 mL Oral BID BM  . metoprolol tartrate  25 mg Oral TID  . pantoprazole  40 mg Oral Q1200   Continuous Infusions: . 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 If 7PM-7AM, please contact night-coverage at [email or web address], password [facility] 09/04/2013, 1:37 PM  LOS: 31 days

## 2013-09-04 NOTE — Progress Notes (Signed)
NUTRITION FOLLOW UP  Intervention:   Ensure Complete PO BID, each supplement provides 350 kcal and 13 grams of protein. RD to continue to follow nutrition care plan.  Nutrition Dx: Increased nutrient needs r/t wound healing AEB estimated needs. ongoing  Goal: Intake to meet >90% of estimated nutrition needs. Improving.  Monitor:   PO intake, labs, weight trends, supplement tolerance  Assessment:   PMHx significant for substance abuse and Hep C. Admitted to [facility] on 10/28 with AMS/sepsis in setting of cocaine and oxycodone use. Pt admitted to [facility] on 10/31 with septic shock from Fusobacterium bacteremia and with multifocal cavitary pneumonia, also with a fistulating purulent soft tissue infection +/- deep infection of the arm.  Pt getting blood transfusions as needed.  Underwent amputation of the following on 11/26: transmetatarsal of R foot, L foot bilateral toes, partial R index finger and partial L all fingers.  Continues on Dysphagia 2 with thin liquids. Pt reports that he is eating well and drinking his Ensure as scheduled. Meal intake is documented at 100%.  Plan to d/c to SNF when okay per vascular and as WBC continue to decrease.  Height: Ht Readings from Last 1 Encounters:  08/20/13 5\' 6"  (1.676 m)   Weight Status:   Wt Readings from Last 1 Encounters:  09/03/13 158 lb 11.7 oz (72 kg)  08/11/13  213 lb 13.5 oz (97 kg)  08/05/13  235 lb 0.2 oz (106.6 kg)  Pt's weight down significantly since admission; however pt is presently net -40 liters since admit.  Re-estimated needs:  Kcal: 2200-2400 Protein: 120-140 gm Fluid: 2.2-2.4 L  Skin:  Multiple incisions to various amputation sites of fingers and toes  Diet Order: Dysphagia 2 with thin liquids    Intake/Output Summary (Last 24 hours) at 09/04/13 1042 Last data filed at 09/04/13 0842  Gross per 24 hour  Intake   1500 ml  Output   4275 ml  Net  -2775 ml    Last BM: 11/29   Labs:   Recent  Labs Lab 09/01/13 0525 09/02/13 0647 09/04/13 0500  NA 137 135 137  K 3.9 3.5 3.7  CL 98 96 98  CO2 27 28 29   BUN 46* 48* 41*  CREATININE 2.91* 2.60* 2.26*  CALCIUM 8.3* 8.6 8.6  GLUCOSE 96 90 93    CBG (last 3)  No results found for this basename: GLUCAP,  in the last 72 hours  Scheduled Meds: . chlorhexidine  15 mL Mouth/Throat BID  . docusate sodium  100 mg Oral BID  . ertapenem  1 g Intravenous Daily  . feeding supplement (ENSURE COMPLETE)  237 mL Oral BID BM  . metoprolol tartrate  25 mg Oral TID  . pantoprazole  40 mg Oral Q1200    Continuous Infusions: . sodium chloride 20 mL/hr at 08/30/13 2130    Timothy Motto MS, [place], LDN Pager: [phone] After-hours pager: [phone]

## 2013-09-04 NOTE — Progress Notes (Addendum)
[facility]  Subjective  - "My pain is better each day."   Objective 113/83 82 98.2 F (36.8 C) (Oral) 19 97%  Intake/Output Summary (Last 24 hours) at 09/04/13 0802 Last data filed at 09/03/13 2100  Gross per 24 hour  Intake   2290 ml  Output   1675 ml  Net    615 ml    Right lateral incision min. maceration and drainage SS fluid. Left foot incision clean dry and intact.  Assessment/Planning: Procedure(s): Right Index Finger Partial Amputation Left Hand All Fingers Partial Amputations  5 Days Post-OpSurgeon(s): Knute Neu, MD Left incisions open to air Will check on patient later.  Plan to re-dress right foot due to drainage.     Clinton Gallant [facility] 09/04/2013 8:02 AM --  Laboratory Lab Results:  Recent Labs  09/03/13 1805 09/04/13 0500  WBC 20.6* 19.4*  HGB 8.5* 9.0*  HCT 25.6* 27.2*  PLT 498* 540*   BMET  Recent Labs  09/02/13 0647 09/04/13 0500  NA 135 137  K 3.5 3.7  CL 96 98  CO2 28 29  GLUCOSE 90 93  BUN 48* 41*  CREATININE 2.60* 2.26*  CALCIUM 8.6 8.6    COAG Lab Results  Component Value Date   INR 1.19 08/14/2013   INR 1.36 08/08/2013   INR 1.55* 08/06/2013   No results found for this basename: PTT
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
## 2013-09-04 NOTE — Evaluation (Signed)
Physical Therapy Re-Evaluation Patient Details Name: Timothy Kline MRN: [id number] DOB: October 11, 1960 Today's Date: 09/04/2013 Time: 8295-6213 PT Time Calculation (min): 22 min  PT Assessment / Plan / Recommendation History of Present Illness  52 year old male presented to [facility] 10/28 with AMS/Sepsis in setting of cocaine /oxycodone use. Pt admitted with Respiratory Failure (ARDS)/Sepsis has developed acute renal failure, thrombocytopenia, a-fib Flutter with RVR and was transferred to [facility] on 10/31 for HD and further care. 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; also s/p partial digit amputations L hand and partial second ray amputation R hand   Clinical Impression  PT continues to follow pt, now s/p above surgeries; Making good gains in mobility, and able to take a few steps today while supoting self on bilateral platform RW; Noted difficulty organizing his center of mass over his feet (base of support) with significant posterior lean with upright activity; Will benefit from continued PT services to maximize independence and safety with mobility;   Recommend comprehensive inpatient rehab (CIR) for post-acute therapy needs.     PT Assessment  Patient needs continued PT services    Follow Up Recommendations  CIR (Will likely be able to reach at or near modI functional level with intensive rehab)    Does the patient have the potential to tolerate intense rehabilitation      Barriers to Discharge   Pt will have some assist available to him at home; not exactly how much assist, though    Equipment Recommendations  Rolling walker with 5"  wheels;3in1 (PT);Other (comment) (Bil platform RW)    Recommendations for Other Services     Frequency Min 4X/week    Precautions / Restrictions Precautions Precautions: Fall Precaution Comments: NWB B hands Required Braces or Orthoses: Other Brace/Splint Other Brace/Splint: Bilateral Darco shoes Restrictions Weight Bearing Restrictions: Yes RUE Weight Bearing: Non weight bearing (hand) LUE Weight Bearing: Non weight bearing (hand) Other Position/Activity Restrictions: WB through heels in Darco shoes bilaterally   Pertinent Vitals/Pain 7/10 back pain sitting EOB patient repositioned for comfort       Mobility  Bed Mobility Bed Mobility: Supine to Sit;Sitting - Scoot to Edge of Bed Supine to Sit: 4: Min guard;HOB elevated Sitting - Scoot to [place] of Bed: 4: Min guard Details for Bed Mobility Assistance: cues for sequencing and NWB of B hands Transfers Transfers: Sit to Stand;Stand to Sit Sit to Stand: 1: +2 Total assist;From bed;From elevated surface Sit to Stand: Patient Percentage: 50% Stand to Sit: To chair/3-in-1;1: +2 Total assist Stand to Sit: Patient Percentage: 60% Details for Transfer Assistance: used B UE platform RW; cues for safety, sequencing and directions; tending to lean posteriorly -- difficulty organizing his center of mass over his feet Ambulation/Gait Ambulation/Gait Assistance: 1: +2 Total assist Ambulation/Gait: Patient Percentage: 50% Ambulation Distance (Feet): 5 Feet Assistive device: Bilateral platform walker Ambulation/Gait Assistance Details: Verbal, tactile, and demo cues for techique as well as posture; Pt tending to knees and hips flexed and feet anterior to his center of mass -- resulting in a posterior lean that requires +2 assist for safety and support Gait Pattern: Step-to pattern;Decreased step length - right;Decreased step length - left;Decreased  stance time - right;Decreased stance time - left;Trunk flexed    Exercises General Exercises -  Upper Extremity Shoulder Flexion: AROM;Both;5 reps;Seated Shoulder Extension: AROM;Both;5 reps;Seated Shoulder ABduction: AROM;5 reps;Both;Seated Elbow Flexion: AROM;Both;5 reps;Seated Elbow Extension: AROM;Both;5 reps;Seated   PT Diagnosis: Difficulty walking;Generalized weakness;Acute pain  PT Problem List: Decreased strength;Decreased range of motion;Decreased activity tolerance;Decreased balance;Decreased mobility;Decreased coordination;Decreased knowledge of use of DME;Pain PT Treatment Interventions: DME instruction;Gait training;Functional mobility training;Therapeutic activities;Therapeutic exercise;Balance training;Neuromuscular re-education;Patient/family education     PT Goals(Current goals can be found in the care plan section) Acute Rehab PT Goals Patient Stated Goal: to to intensive rehab to get stronger PT Goal Formulation: With patient Time For Goal Achievement: 09/18/13 Potential to Achieve Goals: Good  Visit Information  Last PT Received On: 09/04/13 Assistance Needed: +2 PT/OT Co-Evaluation/Treatment: Yes History of Present Illness: 52 year old male presented to [facility] 10/28 with AMS/Sepsis in setting of cocaine /oxycodone use. Pt admitted with Respiratory Failure (ARDS)/Sepsis has developed acute renal failure, thrombocytopenia, a-fib Flutter with RVR and was transferred to [facility] on 10/31 for HD and further care. 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       Prior Functioning  Home Living Family/patient expects to be discharged to:: Inpatient rehab Living Arrangements: Non-relatives/Friends Additional Comments:   Prior Function Level of Independence: Independent Communication Communication: No difficulties Dominant Hand: Right    Cognition  Cognition Arousal/Alertness: Awake/alert Behavior During Therapy: WFL for tasks assessed/performed Overall Cognitive Status: Within Functional Limits for tasks assessed    Extremity/Trunk Assessment Upper Extremity Assessment Upper Extremity Assessment: Defer to OT evaluation Lower Extremity Assessment Lower Extremity Assessment: Generalized weakness (Bil transmet amputations) Cervical / Trunk Assessment Cervical / Trunk Assessment: Other exceptions Cervical / Trunk Exceptions: head and shoulders forward posture   Balance Balance Balance Assessed: Yes Dynamic Sitting Balance Dynamic Sitting - Balance Support: No upper extremity supported;During functional activity Dynamic Sitting - Level of Assistance: 5: Stand by assistance  End of Session PT - End of Session Equipment Utilized During Treatment: Gait belt Activity Tolerance: Patient tolerated treatment well Patient left: in chair;with call bell/phone within reach;with family/visitor present Nurse Communication: Mobility status  GP     Olen Pel [place], [place] [phone]  09/04/2013, 1:56 PM

## 2013-09-05 ENCOUNTER — Encounter (HOSPITAL_COMMUNITY): Payer: Self-pay | Admitting: General Surgery

## 2013-09-05 DIAGNOSIS — A4189 Other specified sepsis: Secondary | ICD-10-CM

## 2013-09-05 DIAGNOSIS — R5381 Other malaise: Secondary | ICD-10-CM

## 2013-09-05 DIAGNOSIS — S98919A Complete traumatic amputation of unspecified foot, level unspecified, initial encounter: Secondary | ICD-10-CM

## 2013-09-05 DIAGNOSIS — I96 Gangrene, not elsewhere classified: Secondary | ICD-10-CM

## 2013-09-05 MED ORDER — INFLUENZA VAC SPLIT QUAD 0.5 ML IM SUSP
0.5000 mL | Freq: Once | INTRAMUSCULAR | Status: AC
  Administered 2013-09-05: 15:00:00 0.5 mL via INTRAMUSCULAR
  Filled 2013-09-05: qty 0.5

## 2013-09-05 MED ORDER — PNEUMOCOCCAL VAC POLYVALENT 25 MCG/0.5ML IJ INJ
0.5000 mL | INJECTION | Freq: Once | INTRAMUSCULAR | Status: AC
  Administered 2013-09-05: 16:00:00 0.5 mL via INTRAMUSCULAR
  Filled 2013-09-05 (×2): qty 0.5

## 2013-09-05 NOTE — Discharge Summary (Addendum)
Physician Discharge Summary  Theordore Cisnero ZOX:[id number] DOB: May 26, 1961 DOA: 08/04/2013  PCP: No primary provider on file.  Admit date: 08/04/2013 Discharge date: 09/05/2013  Time spent: 35 minutes  Recommendations for Outpatient Follow-up:  1. Needs to follow up with Dr Izora Ribas next week after hand surgery.  2. Needs to follow up with ID in 2 weeks for further antibiotics regimen.  3. Needs to follow up with Cardiology in 2 -3 weeks.   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   Acute respiratory failure   Septic shock   Acute renal failure   Thrombocytopenia   DIC (disseminated intravascular coagulation)   Bacteremia   Lemierre syndrome   Anemia   Heme + stool   Hepatitis C   Hepatitis C   Atrial fibrillation with RVR   Acute systolic heart failure   Dilated cardiomyopathy   Discharge Condition: Stable  Diet recommendation: Heart HEalthy  Filed Weights   09/02/13 0634 09/03/13 0500 09/05/13 8119  Weight: 71.1 kg (156 lb 12 oz) 72 kg (158 lb 11.7 oz) 70.716 kg (155 lb 14.4 oz)    History of present illness:  52 year old male with history of substance abuse who presented to [facility] on 10/28 accompanied by friends reporting he was "not acting right" in the setting of 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 Initial labs demonstrated acute renal failure, profound metabolic acidosis (6.9) and initial temp of 106. He developed respiratory failure on 10/29 requiring intubation & pressor support. CXR with progressive bilateral airspace disease. Patient continued to decline - developing significant thrombocytopenia, worsened sr cr, AMS, & fevers. Limits to care put in place per family. Patient transferred to [facility] 10/31 for consideration of HD / Nephrology consult.    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  -Clear by Dr Myra Gianotti to be discharge.  -Patient needs to follow up with Dr Izora Ribas  next week. Dr Izora Ribas recommend to continue with dressing of left hand unless get wet. ( If it get wet, need to have dressing changes (wash with soup and water, apply ointment antibiotics and keep them cover).  -Waiting CIR insurance authorization. Stable for transfer to CIR when bed available.   Acute renal failure  - CRRT started 10/31 transitioned to intermittent dialysis Last dialysis 11/9.  - Renal followed and signed off on 11/12  -Cr at 2.2. Stable.  -hold on IV fluids due to heart failure.   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 , afebrile>> ID recs as per 11/17 note>> invanz, and CVL is to be removed as it was put in while pt was bacteremic>> IR placed new tunnelled line 11/19, old CVL removed. -Discussed case with Dr Ninetta Lights 12-01  due to persistent of elevated WBC, will continue with IV INVAZ for 2 more weeks. Patient needs to follow up with ID clinic  within 2 week. He will probably need to be on oral Augmentin after IV antibiotics. Need very close follow up with ID clinic.  -WBC trending down today at 19. Repeat chest x ray improvement lung infection.   Multifocal cavitary pneumonia /Acute respiratory failure/ARDS  - Patient was on the vent through11/10 and he was extubated, respiratory status remained stable. Abx as above.    Thrombocytopenia with coagulopathy  - Secondary to Sepsis and DIC.  - As treated with antibiotics as discussed above, hematology was consulted and followed  - improving, platelets now normal.   Anemia/Heme + stool; acute illness.  - Per GI his anemia is multifactorial and endoscopic evaluation would not change his clinical outcome at this time.  - no gross bleeding  - continue to monitor hgb and transfuse as appropriate  -S/P one unit PRBC transfusion 11-27. HB at 9   a-fib Flutter with RVR  - S/P10/31 Cardioversion for Afib/Flutter rate 170's ( Unsuccessful).  - also was treated with Amiodarone for Afib/Flutter RVR no dc'ed  - rate remained controlled on current metoprolol TID   Cardiomyopathy-Echocardiogram: LVEF 20-25%.  - With increased peripheral edema noted, patient was followed an volume managed with dialysis per renal through 11/9  - Not on diuretics as this has been limited by Hypotension (now resolved) and poor renal function- stable.  -No ACE due to renal failure.  -Needs to follow up with Cardiology  Hypernatremia  - resolved.  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  -Improving.  Rash/Thrombotic vasculopathic reaction  -s/p Skin biopsy per derm on 11/1; thrombotic vasculopathic reaction.  Diarrhea  -C Diff negative11/06  -resolved.  Hypokalemia  Replete.  cocaine /oxycodone use  Hepatitis C  Lemierre syndrome   Procedures: Brief Narrative:  Pt is a 52 year old male seen initially at [facility] 10/28 with AMS/Sepsis in setting of cocaine /oxycodone use. He was admitted with Respiratory Failure (ARDS)/Sepsis and developed acute renal failure, thrombocytopenia, a-fib Flutter with RVR and was transferred to [facility] service here at [facility] on 10/31 for HD and further care.  SIGNIFICANT EVENTS / STUDIES:  10/28 Admission to [facility]  10/29 Metabolic Acidosis, pH=6.9  10/31 Transferred to [facility] for HD.  10/31 Cardioversion for Afib/Flutter rate 170's ( Unsuccessful). Amiodarone initiated for Afib/Flutter RVR  10/31 Renal consult: CRRT initiated  10/31 Heme consult: TCP likely due to DIC. Doubt TTP/HUS  11/01 Echocardiogram: LVEF 20-25%. Diffuse HK  11/01 ID consult  11/01 Skin biopsy: thrombotic vasculopathic reaction  11/2 tolerated HD, off pressors. Platelet transfusion for thrombocytopenia  11/3 CXR with new pulmonary opacities/nodules. Concern for septic emboli.  11/03 CT chest: Multilobar bilateral areas of pulmonary parenchymal nodular consolidation with internal necrosis/ cavitation and suggestion of surrounding hazy opacity. The appearance is most typical for septic emboli  11/03 CT head: NAD  11/03 BCx from [place] 2/2 + for FUSOBACTERIUM NECROPHORUM.  11/04 CT neck (noncontrasted): No evidence of jugular vein thrombosis, abscesses, inflammation.  11/05 Jugular vein [place]: No thrombus noted  11/04 Converted to NSR from Afib/Aflutter  11/05 Dexmedetomidine initiated in anticipation of extubation soon.  11/06 C Diff negative, Tracheal aspirate few yeast  11/06 Continued fevers, restart vanc for possible MRSA PNA  11/07 Sinus Tract R antecubital arm at site of previous punch bx discovered  11/08 MRI R forearm/elbow/humerus: inadequate study due to inability to cooperate  11/09 Debridement and lavage  of RUE wound (Coley). No joint space infxn  11/09 Hypotension > norepi. Acute blood loss anemia (probably post op) > 2 units RBCs. Weaned off norepi after RBCs  11/09 Vasc surg consult for digital gangrene. No acute intervention, allow demarcation, possible amputation in future  11/10 Extubated, tolerated well  11/10 Pt in PAF despite amiodarone  11/11 1 u pRBC transfused  11/12 NSR, restart Metoprolol  11/13 FOBT +, Hgb to 6.6  11/14 Transfused 2 u pRBCs  11/14 GI consult for possible UGI vs LGI bleed     Consultations: Renal -  Heme/onc -  Dermatology - cardiololgy - GI - s/o Vascular ID    Discharge Exam: Filed Vitals:   09/05/13 0620  BP: 125/71  Pulse: 78  Temp: 97.6 F (36.4 C)  Resp: 19    General: No distress.  Cardiovascular: S 1, S 2 RRR Respiratory: CTA Extremities LE healing well. Left Upper extremity with clean dressing.   Discharge Instructions   Future Appointments Provider Department Dept Phone   09/11/2013 2:00 PM Randall Hiss, MD [facility] [phone]       Medication List    ASK your doctor about these medications       gabapentin 600 MG tablet  Commonly known as:  NEURONTIN  Take 600 mg by mouth 3 (three) times daily.     metoprolol tartrate 25 MG tablet  Commonly known as:  LOPRESSOR  Take 25 mg by mouth 2 (two) times daily.     oxyCODONE-acetaminophen 10-325 MG per tablet  Commonly known as:  PERCOCET  Take 1 tablet by mouth 4 (four) times daily.       No Known Allergies     Follow-up Information   Follow up with Myra Gianotti IV, Lala Lund, MD In 2 weeks.   Specialty:  Vascular Surgery   Contact information:   [address] [place] [address] [phone]       Follow up with Molinda Bailiff, MD In 1 week.   Specialty:  General Surgery   Contact information:   [address] [place] [place] [address] [phone]       Follow up with Acey Lav, MD In 2 weeks.   Specialty:  Infectious Diseases   Contact information:   [address] [place] [address] [phone]        The results of significant diagnostics from this hospitalization (including imaging, microbiology, ancillary and laboratory) are listed below for reference.    Significant Diagnostic Studies: Dg Chest 2 View  08/31/2013   CLINICAL DATA:  Leukocytosis.  Mild cough and possible congestion.  EXAM: CHEST  2 VIEW  COMPARISON:  08/22/2013 and chest  CT 08/07/2013.  FINDINGS: Left IJ central venous catheter has tip overlying the SVC just below the level of the carina. Lungs are adequately inflated with continued patchy nodular airspace opacification bilaterally over the mid to lower lungs without significant change from 08/07/2013 but improved compared to 08/22/2013. Evidence of pleural fluid posteriorly on the lateral film. Cardiomediastinal silhouette and remainder of the exam is unchanged.  IMPRESSION: Continued nodular airspace process over the mid to lower lungs with improvement compared to 08/22/2013 likely representing ongoing infectious process. Small effusions.  Left IJ central venous catheter with tip overlying the SVC.   Electronically Signed   By: Elberta Fortis M.D.   On: 08/31/2013 13:09   Ct Head Wo Contrast  08/07/2013  CLINICAL DATA:  Subsequent admission, patient difficulty breathing  EXAM: CT HEAD WITHOUT CONTRAST  TECHNIQUE: Contiguous axial images were obtained from the base of the skull through the vertex without intravenous contrast.  COMPARISON:  CT HEAD W/O CM dated 08/02/2013  FINDINGS: Patient intubated. No acute intracranial hemorrhage. No focal mass lesion. No CT evidence of acute infarction. No midline shift or mass effect. No hydrocephalus. Basilar cisterns are patent. Small fluid in the left mastoid air cells. Paranasal sinuses are clear. Frontal sinuses clear.  IMPRESSION: 1. No acute intracranial findings. No change from prior.  2. Small of fluid in the left mastoid air cells is new.   Electronically Signed   By: Genevive Bi M.D.   On: 08/07/2013 16:06   Ct Soft Tissue Neck Wo Contrast  08/08/2013   CLINICAL DATA:  Sepsis. Acute renal failure.  Rule out neck abscess.  EXAM: CT NECK WITHOUT CONTRAST  TECHNIQUE: Multidetector CT imaging of the neck was performed following the standard protocol without intravenous contrast.  COMPARISON:  None.  FINDINGS: Lack of intravenous contrast to the limitation of this examination.   Right jugular central venous catheter is noted extending into the right innominate vein with tip not visualized. The right jugular vein does not appear distended or ill-defined however the study cannot exclude venous thrombosis without intravenous contrast. Ultrasound of the neck may be helpful to evaluate for jugular vein thrombosis. Superior mediastinum does not show any mass or fluid collection.  Negative for fluid collection or abscess in the neck. No mass lesion is identified. No edema in the neck.  The patient is intubated. NG tube is in place.  Mild cervical degenerative changes. No acute bony change in the cervical spine. Extensive dental infection is noted with multiple caries and bony changes of chronic dental infection.  IMPRESSION: Right Jugular central venous catheter. No definite evidence of jugular venous thrombosis however this study is limited to evaluate for venous thrombosis without intravenous contrast. Consider followup neck ultrasound.  Negative for mass, edema, or abscess in the neck.   Electronically Signed   By: Marlan Palau M.D.   On: 08/08/2013 15:29   Ct Chest Wo Contrast  08/07/2013   CLINICAL DATA:  Sepsis, shortness of breath, pulmonary masses on prior exam. History of hepatitis-C and acute respiratory failure.  EXAM: CT CHEST WITHOUT CONTRAST  TECHNIQUE: Multidetector CT imaging of the chest was performed following the standard protocol without IV contrast.  COMPARISON:  08/07/2013 chest radiograph. No prior chest CT for comparison.  FINDINGS: Streak artifact from the patient's arms is present. Endotracheal tube is appropriately positioned. The nasogastric tube terminates below the level of the diaphragms. Right IJ approach central line tip terminates within the upper SVC. Mild cardiomegaly noted. No significant pericardial effusion. Trace pleural effusions are noted bilaterally. Incomplete imaging of the upper abdomen demonstrates a small amount of abdominal ascites. Great  vessels are normal in caliber.  Dense the predominantly bilateral lower lobe confluent pulmonary parenchymal consolidation is noted with air bronchogram formation. There are patchy areas of multi focal bilateral airspace consolidation with internal cavitation. Imaging is degraded by respiratory motion. Allowing for this, there is a subjective suggestion of hazy parenchymal opacification surrounding these necrotic/cavitary lesions. These are predominantly peripheral in location.  No acute osseous abnormality. Healing left inferior posterior rib fractures are noted, incompletely imaged.  IMPRESSION: Multilobar bilateral areas of pulmonary parenchymal nodular consolidation with internal necrosis/ cavitation and suggestion of surrounding hazy opacity. The appearance is most typical for septic  emboli, focal necrotizing pneumonia including atypical agents such as fungal infection, or less likely bland infarct. Superimposed bilateral lower lobe consolidation may indicate pneumonia and or aspiration.   Electronically Signed   By: Christiana Pellant M.D.   On: 08/07/2013 15:54   Ir Fluoro Guide Cv Line Left  08/23/2013   INDICATION: 51 year old with sepsis and renal insufficiency.  EXAM: FLUOROSCOPIC AND ULTRASOUND GUIDED PLACEMENT OF A TUNNELED CENTRAL VENOUS CATHETER  Physician: Rachelle Hora. Henn, MD  MEDICATIONS: None  ANESTHESIA/SEDATION: None  FLUOROSCOPY TIME:  2 min and 12 seconds  PROCEDURE: Informed consent was obtained for placement of a tunneled central vena catheter. The patient was placed supine on the interventional table. Ultrasound confirmed a patent left internal jugularvein. Patient already had a right jugular catheter in place. Ultrasound images were obtained for documentation. The left side of the neck was prepped and draped in a sterile fashion. The left side of the neck was anesthetized with 1% lidocaine. Maximal barrier sterile technique was utilized including caps, mask, sterile gowns, sterile gloves,  sterile drape, hand hygiene and skin antiseptic. A small incision was made with #11 blade scalpel. A 21 gauge needle directed into the left internal jugular vein with ultrasound guidance. A micropuncture dilator set was placed. A dual lumen Powerline catheter was selected. The skin below the left clavicle was anesthetized and a small incision was made with an #11 blade scalpel. A subcutaneous tunnel was formed to the vein dermatotomy site. The catheter was brought through the tunnel. The cuff was placed underneath the skin. The vein dermatotomy site was dilated to accommodate a peel-away sheath. The catheter was placed through the peel-away sheath and directed into the central venous structures. The tip of the catheter was placed in the lower SVC with fluoroscopy. Fluoroscopic images were obtained for documentation. Both lumens were found to aspirate and flush well. The vein dermatotomy site was closed using Dermabond. The catheter was secured to the skin using Prolene suture.  COMPLICATIONS: None  FINDINGS: Catheter tip in the lower SVC.  IMPRESSION: Successful placement of a tunneled central venous catheter using ultrasound and fluoroscopic guidance.   Electronically Signed   By: Richarda Overlie M.D.   On: 08/23/2013 12:19   Ir US Guide Vasc Access Left  08/23/2013   INDICATION: 52 year old with sepsis and renal insufficiency.  EXAM: FLUOROSCOPIC AND ULTRASOUND GUIDED PLACEMENT OF A TUNNELED CENTRAL VENOUS CATHETER  Physician: Rachelle Hora. Henn, MD  MEDICATIONS: None  ANESTHESIA/SEDATION: None  FLUOROSCOPY TIME:  2 min and 12 seconds  PROCEDURE: Informed consent was obtained for placement of a tunneled central vena catheter. The patient was placed supine on the interventional table. Ultrasound confirmed a patent left internal jugularvein. Patient already had a right jugular catheter in place. Ultrasound images were obtained for documentation. The left side of the neck was prepped and draped in a sterile fashion. The  left side of the neck was anesthetized with 1% lidocaine. Maximal barrier sterile technique was utilized including caps, mask, sterile gowns, sterile gloves, sterile drape, hand hygiene and skin antiseptic. A small incision was made with #11 blade scalpel. A 21 gauge needle directed into the left internal jugular vein with ultrasound guidance. A micropuncture dilator set was placed. A dual lumen Powerline catheter was selected. The skin below the left clavicle was anesthetized and a small incision was made with an #11 blade scalpel. A subcutaneous tunnel was formed to the vein dermatotomy site. The catheter was brought through the tunnel. The cuff was placed  underneath the skin. The vein dermatotomy site was dilated to accommodate a peel-away sheath. The catheter was placed through the peel-away sheath and directed into the central venous structures. The tip of the catheter was placed in the lower SVC with fluoroscopy. Fluoroscopic images were obtained for documentation. Both lumens were found to aspirate and flush well. The vein dermatotomy site was closed using Dermabond. The catheter was secured to the skin using Prolene suture.  COMPLICATIONS: None  FINDINGS: Catheter tip in the lower SVC.  IMPRESSION: Successful placement of a tunneled central venous catheter using ultrasound and fluoroscopic guidance.   Electronically Signed   By: Richarda Overlie M.D.   On: 08/23/2013 12:19   Dg Chest Port 1 View  08/22/2013   CLINICAL DATA:  Check the position of central line.  EXAM: PORTABLE CHEST - 1 VIEW  COMPARISON:  08/15/2013, 08/14/2013.  FINDINGS: There is a right-sided jugular central venous catheter with the tip projecting over the SVC IJ confluence. Marland Kitchen Heart size and mediastinum are stable.  There are bilateral nodular airspace opacities primarily involving the lower lungs with some of these opacities demonstrating cavitation. The overall appearance is most consistent with septic emboli.  IMPRESSION: Right-sided  jugular central venous catheter with the tip projecting over the SVC IJV confluence.  Bilateral nodular airspace opacities again noted in the lower lungs most consistent with septic emboli.   Electronically Signed   By: Elige Ko   On: 08/22/2013 09:19   Dg Chest Port 1 View  08/15/2013   CLINICAL DATA:  Evaluate pulmonary edema  EXAM: PORTABLE CHEST - 1 VIEW  COMPARISON:  08/14/2013; 08/10/2013; 08/07/2013; chest CT -08/07/2013  FINDINGS: Grossly unchanged cardiac silhouette and mediastinal contours. Interval extubation and removal of enteric tube. Otherwise, stable positioning of remaining support apparatus. No pneumothorax. Grossly unchanged bilateral mid and lower lung heterogeneous airspace opacities 1 of which within the periphery of the right lower lung appears to demonstrate central cavitation. No new focal airspace opacities. Suspect a trace left-sided effusion. Unchanged bones, including sequela of old right clavicular fracture.  IMPRESSION: 1. Interval extubation and removal of enteric tube. Otherwise, stable position of support apparatus. No pneumothorax. 2. Grossly unchanged bilateral mid and lower lung predominant nodular airspace opacities again worrisome for septic emboli.   Electronically Signed   By: Simonne Come M.D.   On: 08/15/2013 07:50   Dg Chest Port 1 View  08/14/2013   CLINICAL DATA:  Respiratory failure.  EXAM: PORTABLE CHEST - 1 VIEW  COMPARISON:  Chest radiograph 08/10/2013.  FINDINGS: ET tube terminates 2.2 cm superior to the carina. NG tube tip and side-port course inferior to the diaphragm, tip not included on this examination. Right IJ central venous catheter tip projects over the superior vena cava. Stable cardiac and mediastinal contours. No significant interval change in multiple bilateral nodular pulmonary opacities. No definite pleural effusion or pneumothorax.  IMPRESSION: ET tube terminates 2.2 cm superior to the carina  Chest radiograph with unchanged bilateral  nodular pulmonary opacities, potentially representing multi focal septic emboli.   Electronically Signed   By: Annia Belt M.D.   On: 08/14/2013 07:34   Dg Chest Port 1 View  08/10/2013   CLINICAL DATA:  Respiratory failure.  EXAM: PORTABLE CHEST - 1 VIEW  COMPARISON:  Chest x-ray 08/07/2013.  FINDINGS: An endotracheal tube is in place with tip 2.8 cm above the carina. There is a right-sided internal jugular central venous catheter with tip terminating in the mid superior vena cava. A  nasogastric tube is seen extending into the stomach, however, the tip of the nasogastric tube extends below the lower margin of the image. Lung volumes have improved slightly, now near normal. Multifocal nodular opacities are again noted throughout the mid to lower lungs bilaterally, some of which appear cavitary. No definite pleural effusions decreasing airspace consolidation throughout the left lower lobe. No evidence of pulmonary edema. Heart size is normal. Mediastinal contours are unremarkable.  IMPRESSION: 1. Support apparatus, as above. 2. Improving aeration in the left lower lobe, compatible with resolving airspace consolidation. 3. Persistent multifocal nodular opacities (some of which are cavitary) throughout the mid to lower lungs bilaterally, likely to reflect multifocal septic emboli based on comparison with recent chest CT.   Electronically Signed   By: Trudie Reed M.D.   On: 08/10/2013 06:44   Dg Chest Port 1 View  08/07/2013   CLINICAL DATA:  Evaluate endotracheal tube placement.  EXAM: PORTABLE CHEST - 1 VIEW  COMPARISON:  CHEST x-ray 08/06/2013.  FINDINGS: An endotracheal tube is in place with tip 4.0 cm above the carina. There is a right-sided internal jugular central venous catheter with tip terminating in the mid superior vena cava. A nasogastric tube is seen extending into the stomach, however, the tip of the nasogastric tube extends below the lower margin of the image. Lung volumes are normal. Dense  opacification at the base of the left hemithorax may reflect atelectasis and/or consolidation. Patchy nodular opacities are noted throughout the mida to lower lungs bilaterally. Possible trace left pleural effusion. No evidence of pulmonary edema. Heart size is borderline enlarged. Mediastinal contours are unremarkable.  IMPRESSION: 1. Support apparatus, as above. 2. Persistent atelectasis and/or consolidation in the left lower lobe. 3. Multifocal nodular opacities throughout the mid to lower lungs bilaterally. These are new compared to recent prior examinations dating back to 08/02/2013, and are therefore favored to be of infectious or inflammatory etiology.   Electronically Signed   By: Trudie Reed M.D.   On: 08/07/2013 05:45   Dg Abd Portable 1v  08/20/2013   CLINICAL DATA:  Generalized abdominal pain  EXAM: PORTABLE ABDOMEN - 1 VIEW  COMPARISON:  None.  FINDINGS: The bowel gas pattern is normal. No radio-opaque calculi or other significant radiographic abnormality are seen. Mild degradation of image quality due to patient motion. Right lung base opacity partly visualized. Presence or absence of air-fluid levels or free air is suboptimally evaluated on this supine projection.  IMPRESSION: Negative.   Electronically Signed   By: Christiana Pellant M.D.   On: 08/20/2013 15:27    Microbiology: No results found for this or any previous visit (from the past 240 hour(s)).   Labs: Basic Metabolic Panel:  Recent Labs Lab 08/30/13 0540 08/31/13 0540 09/01/13 0525 09/02/13 0647 09/04/13 0500  NA 138 137 137 135 137  K 3.5 3.4* 3.9 3.5 3.7  CL 96 99 98 96 98  CO2 29 28 27 28 29   GLUCOSE 90 92 96 90 93  BUN 55* 45* 46* 48* 41*  CREATININE 3.23* 2.85* 2.91* 2.60* 2.26*  CALCIUM 9.1 7.8* 8.3* 8.6 8.6   Liver Function Tests: No results found for this basename: AST, ALT, ALKPHOS, BILITOT, PROT, ALBUMIN,  in the last 168 hours No results found for this basename: LIPASE, AMYLASE,  in the last 168  hours No results found for this basename: AMMONIA,  in the last 168 hours CBC:  Recent Labs Lab 08/31/13 0540 09/01/13 0525 09/02/13 0647 09/03/13 1805 09/04/13 0500  WBC 22.6* 22.0* 21.5* 20.6* 19.4*  HGB 7.5* 8.3* 8.5* 8.5* 9.0*  HCT 23.3* 24.8* 25.8* 25.6* 27.2*  MCV 94.3 92.5 93.5 93.1 93.5  PLT 323 348 423* 498* 540*   Cardiac Enzymes: No results found for this basename: CKTOTAL, CKMB, CKMBINDEX, TROPONINI,  in the last 168 hours BNP: BNP (last 3 results) No results found for this basename: PROBNP,  in the last 8760 hours CBG: No results found for this basename: GLUCAP,  in the last 168 hours     Signed:  Gwendolen Hewlett  [facility] 09/05/2013, 10:29 AM

## 2013-09-05 NOTE — Progress Notes (Signed)
Patient up to chair with PT, rated pain in left hand 10/10 after using using modified walker to move from bed to chair.  Resting comfortably after PRN pain medication given.  Patient states he has no additional questions or needs at this time.  Will continue to monitor.

## 2013-09-05 NOTE — Progress Notes (Signed)
I met with pt at bedside. Discussed inpt rehab venue as an option for his rehab. He is in agreement. I have begun [employer] authorization and await their decision for inpt rehab vs SNF rehab.Patient is aware. [phone]

## 2013-09-05 NOTE — Progress Notes (Signed)
[facility]   Postoperative Visit - Amputation  Date of Surgery: 08/30/2013 Procedure(s): 1: Ray amputation x5, left foot  #2: Right transmetatarsal amputation   Subjective Timothy Kline is a 52 y.o. male who is 6 Days Post-Op. Pt.denies pain in the stump. The patient notes pain is well controlled. Pt. Ambulating with darco shoes and walker To Rehab today   Significant Diagnostic Studies: CBC Lab Results  Component Value Date   WBC 19.4* 09/04/2013   HGB 9.0* 09/04/2013   HCT 27.2* 09/04/2013   MCV 93.5 09/04/2013   PLT 540* 09/04/2013    BMET    Component Value Date/Time   NA 137 09/04/2013 0500   K 3.7 09/04/2013 0500   CL 98 09/04/2013 0500   CO2 29 09/04/2013 0500   GLUCOSE 93 09/04/2013 0500   BUN 41* 09/04/2013 0500   CREATININE 2.26* 09/04/2013 0500   CALCIUM 8.6 09/04/2013 0500   GFRNONAA 32* 09/04/2013 0500   GFRAA 37* 09/04/2013 0500    COAG Lab Results  Component Value Date   INR 1.19 08/14/2013   INR 1.36 08/08/2013   INR 1.55* 08/06/2013   No results found for this basename: PTT     Intake/Output Summary (Last 24 hours) at 09/05/13 1059 Last data filed at 09/05/13 1018  Gross per 24 hour  Intake   1040 ml  Output   3401 ml  Net  -2361 ml   No data found.    Physical Examination  BP Readings from Last 3 Encounters:  09/05/13 125/71  09/05/13 125/71  09/05/13 125/71   Temp Readings from Last 3 Encounters:  09/05/13 97.6 F (36.4 C) Oral  09/05/13 97.6 F (36.4 C) Oral  09/05/13 97.6 F (36.4 C) Oral   SpO2 Readings from Last 3 Encounters:  09/05/13 98%  09/05/13 98%  09/05/13 98%   Pulse Readings from Last 3 Encounters:  09/05/13 78  09/05/13 78  09/05/13 78    Pt is A&Ox3  WDWN male with no complaints  Bilateral amputation wounds are viable with min to mod serous drainage from right.  There is good bone coverage in the stump Stump is warm and well perfused, without erythema   Assessment/plan:  Timothy Kline is a 52 y.o. male who is s/p  #1: Ray amputation x5, left foot  #2: Right transmetatarsal amputation To rehab today or tomorrow Cont Darco shoes to both feet   Follow-up 4 weeks from surgery  Campbell Agramonte J 10:59 AM 09/05/2013

## 2013-09-05 NOTE — Progress Notes (Signed)
Physical Therapy Treatment Patient Details Name: Timothy Kline MRN: [id number] DOB: May 24, 1961 Today's Date: 09/05/2013 Time: 3086-5784 PT Time Calculation (min): 28 min  PT Assessment / Plan / Recommendation  History of Present Illness 52 year old male presented to [facility] 10/28 with AMS/Sepsis in setting of cocaine /oxycodone use. Pt admitted with Respiratory Failure (ARDS)/Sepsis has developed acute renal failure, thrombocytopenia, a-fib Flutter with RVR and was transferred to [facility] on 10/31 for HD and further care. 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  Continues with LE weakness and impaired balance.  Pt will benefit from CIR stay to reach Mod I level for safe d/c to home.  Follow Up Recommendations  CIR     Does the patient have the potential to tolerate intense rehabilitation     Barriers to Discharge        Equipment Recommendations  Rolling walker with 5" wheels;3in1 (PT);Other (comment)    Recommendations for Other Services    Frequency Min 4X/week   Progress towards PT Goals Progress towards PT goals: Progressing toward goals  Plan Current plan remains appropriate    Precautions / Restrictions Precautions Precautions: Fall Precaution Comments: NWB B hands Other Brace/Splint: Bilateral Darco shoes Restrictions RUE Weight Bearing: Non weight bearing LUE Weight Bearing: Non weight bearing   Pertinent Vitals/Pain No c/o pain.    Mobility  Bed Mobility Supine to Sit: 5: Supervision;HOB elevated Sitting - Scoot to Edge of Bed: 5:  Supervision Transfers Sit to Stand: From bed;From elevated surface;2: Max assist Stand to Sit: 2: Max assist;To bed;To chair/3-in-1 Stand Pivot Transfers: 3: Mod assist Stand Pivot Transfers: Patient Percentage: 50% Details for Transfer Assistance: pt requires bed elevated to stand.  pt requires manual facilitation for anterior wt shifts and lifitng assist.  cues for safe UE placement so pt maintains NWB on hands.  during SPT pt requires min A to manage PFRW, able to take steps to turn without LOB    Exercises General Exercises - Lower Extremity Ankle Circles/Pumps: AROM;20 reps;Both Long Arc Quad: AROM;Both;20 reps Hip ABduction/ADduction: AROM;Both;10 reps Hip Flexion/Marching: AROM;20 reps;Both   PT Diagnosis:    PT Problem List:   PT Treatment Interventions:     PT Goals (current goals can now be found in the care plan section)    Visit Information  Last PT Received On: 09/05/13 Assistance Needed: +1 (may benefit from +2 for gait) History of Present Illness: 52 year old male presented to [facility] 10/28 with AMS/Sepsis in setting of cocaine /oxycodone use. Pt admitted with Respiratory Failure (ARDS)/Sepsis has developed acute renal failure, thrombocytopenia, a-fib Flutter with RVR and was transferred to [facility] on 10/31 for HD and further care. 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    Subjective Data  Cognition  Cognition Arousal/Alertness: Awake/alert Behavior During Therapy: WFL for tasks assessed/performed Overall Cognitive Status: Within Functional Limits for tasks assessed    Balance  Static Standing Balance Static  Standing - Balance Support: Bilateral upper extremity supported Static Standing - Level of Assistance: 3: Mod assist Static Standing - Comment/# of Minutes: static standing balance multiple attempts with focus on getting COG over BOS, assistance for anterior wt shift, trunk extension  End of Session PT - End of Session Equipment Utilized During Treatment: Gait belt Activity Tolerance: Patient tolerated treatment well Patient left: in chair;with call bell/phone within reach   GP     [facility] 09/05/2013, 10:27 AM

## 2013-09-05 NOTE — Progress Notes (Signed)
Occupational Therapy Treatment Patient Details Name: Timothy Kline MRN: [id number] DOB: 07-Aug-1961 Today's Date: 09/05/2013 Time: 4098-1191 OT Time Calculation (min): 36 min  OT Assessment / Plan / Recommendation  History of present illness 52 year old male presented to [facility] 10/28 with AMS/Sepsis in setting of cocaine /oxycodone use. Pt admitted with Respiratory Failure (ARDS)/Sepsis has developed acute renal failure, thrombocytopenia, a-fib Flutter with RVR and was transferred to [facility] on 10/31 for HD and further care. 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   OT comments  Pt making progress with functional goals and should continue with acute OT services to increase level of function and safety. Pt good candidate for CIR  Follow Up Recommendations  CIR    Barriers to Discharge   none    Equipment Recommendations  3 in 1 bedside comode    Recommendations for Other Services Rehab consult  Frequency Min 2X/week   Progress towards OT Goals Progress towards OT goals:  (goals to be updated next session)  Plan Discharge plan needs to be updated    Precautions / Restrictions Precautions Precautions: Fall Precaution Comments: NWB B hands Required Braces or Orthoses: Other Brace/Splint Other Brace/Splint: Bilateral Darco shoes Restrictions Weight Bearing Restrictions: Yes RUE Weight Bearing: Non weight bearing LUE Weight Bearing: Non weight bearing Other Position/Activity Restrictions: WB through heels in Darco shoes bilaterally   Pertinent Vitals/Pain 4/10 B feet and hands    ADL  Eating/Feeding: Supervision/safety;Set up Where  Assessed - Eating/Feeding: Chair Grooming: Performed;Wash/dry hands;Wash/dry face;Supervision/safety;Set up Where Assessed - Grooming: Unsupported sitting Toilet Transfer: Simulated;Maximal assistance Toilet Transfer Method: Sit to stand Toilet Transfer Equipment: Raised toilet seat with arms (or 3-in-1 over toilet) Toileting - Clothing Manipulation and Hygiene: +1 Total assistance    OT Diagnosis:    OT Problem List:   OT Treatment Interventions:     OT Goals(current goals can now be found in the care plan section)    Visit Information  Last OT Received On: 09/05/13 Assistance Needed: +1 History of Present Illness: 52 year old male presented to [facility] 10/28 with AMS/Sepsis in setting of cocaine /oxycodone use. Pt admitted with Respiratory Failure (ARDS)/Sepsis has developed acute renal failure, thrombocytopenia, a-fib Flutter with RVR and was transferred to [facility] on 10/31 for HD and further care. 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    Subjective Data      Prior Functioning       Cognition  Cognition Arousal/Alertness: Awake/alert Behavior During Therapy: WFL for tasks assessed/performed Overall Cognitive Status: Within Functional Limits for tasks assessed    Mobility  Bed Mobility Sit to Supine: 4: Min assist;HOB flat;4: Min guard Scooting to HOB: 1: +1 Total assist Details for Bed Mobility Assistance: cues for sequencing and NWB of B hands Transfers Transfers: Sit to Stand;Stand to Sit Sit to Stand: From chair/3-in-1;2: Max  assist Stand to Sit: To chair/3-in-1;To bed;2: Max assist Details for Transfer Assistance:  pt requires bed elevated to stand.  pt requires manual facilitation for anterior wt shifts and lifitng assist.  cues for safe UE placement so pt maintains NWB on hands.  during SPT pt requires min A to manage PFRW, able to take steps to turn without LOB    Exercises  General Exercises - Upper Extremity Shoulder Flexion: AROM;Both;Seated;10 reps Shoulder Extension: AROM;Both;Seated;10 reps Shoulder ABduction: AROM;Both;Seated;10 reps Elbow Flexion: AROM;Both;Seated;10 reps Elbow Extension: AROM;Both;Seated;10 reps   Balance Dynamic Sitting Balance Dynamic Sitting - Balance Support: No upper extremity supported;During functional activity Dynamic Sitting - Level of Assistance: 5: Stand by assistance   End of Session OT - End of Session Equipment Utilized During Treatment: Gait belt;Rolling walker;Other (comment) (B UE platform RW) Activity Tolerance: Patient tolerated treatment well Patient left: with call bell/phone within reach;with family/visitor present;in bed  GO     [name] 09/05/2013, 3:16 PM

## 2013-09-06 MED ORDER — AMOXICILLIN-POT CLAVULANATE 500-125 MG PO TABS
1.0000 | ORAL_TABLET | Freq: Three times a day (TID) | ORAL | Status: DC
  Administered 2013-09-07: 500 mg via ORAL
  Filled 2013-09-06 (×3): qty 1

## 2013-09-06 NOTE — Progress Notes (Signed)
[employer] has denied the request for an inpt rehab admission at this time. I have notified pt as well as SW. Please call me for any questions. [phone]

## 2013-09-06 NOTE — Progress Notes (Signed)
[facility] PROGRESS NOTE  Timothy Kline ZOX:[id number] DOB: 10/11/1960 DOA: 08/04/2013 PCP: No primary provider on file.  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 -pain is well controlled -plan is to continue supportive care and to follow with hand surgery and vascular surgery after discharge -CIR consulted; but insurance has denied inpatient rehab. At this moment will require SNF for rehabilitation and further care  -Clear by Dr Myra Gianotti to be discharge.  -Patient needs to follow up with Dr Richardson Dopp next week.  Dr Richardson Dopp recommend to continue with dressing of left hand unless get wet. ( If it get wet, need to have dressing changes (wash with soap and water, apply ointment antibiotics and keep them cover).   Acute renal failure  - CRRT started 10/31 transitioned to intermittent dialysis Last dialysis 11/9. - Renal followed and signed off on 11/12 -Cr at 2.2. Stable. -hold on IV fluids due to heart failure.  -avoid nephrotoxic agents and follow BMET as an outpatient.    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  - Patient's blood cultures from [facility] grew Fusobacterium Necrophorum - he was placed on antibiotics with cefepime and vancomycin (had been on Rocephin/Levaquin/Vanc at [facility]) - ID was consulted and has been following patient for antibiotic recommendations - He also required vasopressors and as he improved clinically and blood pressure stabilized this were discontinued - WBC still up today, afebrile>> ID recs as per 11/17 note>> invanz, and CVL is to be removed as it was put in while pt was bacteremic>> IR placed new tunnelled line 11/19, old CVL removed -Discussed case with Dr  Ninetta Lights due to persistent of elevated WBC, will continue with IV INVAZ for 2 more weeks. Patient needs to follow up with ID after discharge and will continue augmentin PO until his follow up appointment. -CBC in am.   Multifocal cavitary pneumonia /Acute respiratory failure/ARDS - Patient was on the vent through11/10 and he was extubated, respiratory status remained stable. Abx as above. -no fever -continue PRN albuterol -advise to quit smoking  Thrombocytopenia with coagulopathy - Secondary to Sepsis and DIC. - As treated with antibiotics as discussed above, hematology was consulted and no further rec's at this moment -platelets now normal.  Anemia/Heme + stool; acute illness.  - Per GI his anemia is multifactorial and endoscopic evaluation would not change his clinical outcome at this time. -no further overt bleeding appreciated - continue to monitor hgb and transfuse as appropriate  -S/P  one unit PRBC transfusion  11-27.  -will follow Hgb trend -continue PPI  a-fib Flutter with RVR  - S/P Cardioversion for Afib/Flutter rate 170's ( Unsuccessful; on 10/31).  - also was treated with Amiodarone  for Afib/Flutter RVR now dc'ed - rate remained controlled on current metoprolol TID -will required follow up with cardiology EP in outpatient setting for further evaluation and treatment -patient might required ICD if EF failed to improve  Cardiomyopathy- -Echocardiogram: LVEF 20-25%. - With increased peripheral edema noted, patient was followed and volume managed with dialysis per renal through 11/9 - Not on diuretics as this has been limited by Hypotension (now resolved) and poor renal function-improving but Cr still elevated>>see as discussed below -recommendations is for low sodium diet and close follow up of renal function after  discharge to start low dose diuretics -ARB/ACE not on board due to renal failure.  Hypernatremia  - resolved.     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 -invanz now finish and patient will be on oral augmentin until follow up with ID  Rash/Thrombotic vasculopathic reaction -s/p Skin biopsy per derm on 11/1; thrombotic vasculopathic reaction. -no further workup needed  Diarrhea -C Diff negative11/06  -resolved.   Hypokalemia  Repleted.  cocaine /tobacco abuse -counseling provided -patient will be discharge to SNF and will require close follow up on his addiction once physically rehabilitated  Hepatitis C  Lemierre syndrome  Brief Narrative: Pt is a 52 year old male seen initially at [facility] 10/28 with AMS/Sepsis in setting of cocaine /oxycodone use. He was admitted with Respiratory Failure (ARDS)/Sepsis and developed acute renal failure, thrombocytopenia, a-fib Flutter with RVR and was transferred to [facility] service here at [facility] on 10/31 for HD and further care.   SIGNIFICANT EVENTS / STUDIES:  10/28 Admission to [facility]  10/29 Metabolic Acidosis, pH=6.9  10/31 Transferred to [facility] for HD.  10/31 Cardioversion for Afib/Flutter rate 170's ( Unsuccessful). Amiodarone initiated for Afib/Flutter RVR  10/31 Renal consult: CRRT initiated  10/31 Heme consult: TCP likely due to DIC. Doubt TTP/HUS  11/01 Echocardiogram: LVEF 20-25%. Diffuse HK  11/01 ID consult  11/01 Skin biopsy: thrombotic vasculopathic reaction  11/2 tolerated HD, off pressors. Platelet transfusion for thrombocytopenia  11/3 CXR with new pulmonary opacities/nodules. Concern for septic emboli.  11/03 CT chest: Multilobar bilateral areas of pulmonary parenchymal nodular consolidation with internal necrosis/ cavitation and suggestion of surrounding hazy opacity. The appearance is most typical for septic emboli  11/03 CT head: NAD  11/03 BCx from [facility] 2/2 + for FUSOBACTERIUM NECROPHORUM.  11/04 CT neck  (noncontrasted): No evidence of jugular vein thrombosis, abscesses, inflammation.  11/05 Jugular vein [place]: No thrombus noted  11/04 Converted to NSR from Afib/Aflutter  11/05 Dexmedetomidine initiated in anticipation of extubation soon.  11/06 C Diff negative, Tracheal aspirate few yeast  11/06 Continued fevers, restart vanc for possible MRSA PNA  11/07 Sinus Tract R antecubital arm at site of previous punch bx discovered  11/08 MRI R forearm/elbow/humerus: inadequate study due to inability to cooperate  11/09 Debridement and lavage of RUE wound (Coley). No joint space infxn  11/09 Hypotension > norepi. Acute blood loss anemia (probably post op) > 2 units RBCs. Weaned off norepi after RBCs  11/09 Vasc surg consult for digital gangrene. No acute intervention, allow demarcation, possible amputation in future  11/10 Extubated, tolerated well  11/10 Pt in PAF despite amiodarone  11/11 1 u pRBC transfused  11/12 NSR, restart Metoprolol  11/13 FOBT +, Hgb to 6.6  11/14 Transfused 2 u pRBCs  11/14 GI consult for possible UGI vs LGI bleed   Code Status: partial Family Communication: brother at bedside.  Disposition Plan: SNF in am (12/4)  Consultants:  GI - s/o  Vascular  ID - s/o  Renal - s/o  Heme/onc - s/o  Dermatology - s/o  cardiololgy - s/o  Procedures: LINES / TUBES:  R IJ CVL 10/28 >>>   ETT 10/29 >> 11/10  10/31 Cardioversion for Afib/Flutter rate 170's ( Unsuccessful) R femoral HD Cath 11/01 >>>11/12 11/01 Echocardiogram: LVEF 20-25%. Diffuse HK 11/01 Skin biopsy: thrombotic vasculopathic reaction  11/09 Debridement and lavage of RUE wound (Coley). No  joint space infxn  11/10 Extubated, tolerated well  CULTURES:  [facility] >> + Fusobacterium Necrophorum  BCx2 10/31>>> Negative  Sputum 10/31>> Few yeast  Tracheal Asp. 11/05>> Rare WBC, no organisms  C Diff 11/5 >> Negative  RUE wound 11/7 >> Negative   Antibiotics: Rocephin: 10/28?>>>10/31   Levaquin: 10/28>>>10/31  Vancomycin 10/28>>> 11/3  Cefepime 10/31>>> 11/3  Flagyl 11/2 >> 11/3  Meropenem 11/3 >> 11/17 Vancomycin 11/6 >> 11/13 Invanz 11/17 >> 12/14 Augmentin 12/4>>>  HPI/Subjective: Feeling well. No significant complaints. Mild left hands pain continue, but overall ell controlled. BCBS insurance has denied CIR; patient to be discharge toSNF.Marland Kitchen   Objective: Filed Vitals:   09/06/13 2110  BP: 118/77  Pulse: 80  Temp: 98.4 F (36.9 C)  Resp: 18    Intake/Output Summary (Last 24 hours) at 09/06/13 2220 Last data filed at 09/06/13 2134  Gross per 24 hour  Intake    720 ml  Output   4175 ml  Net  -3455 ml   Filed Weights   09/03/13 0500 09/05/13 0620 09/06/13 0547  Weight: 72 kg (158 lb 11.7 oz) 70.716 kg (155 lb 14.4 oz) 68.2 kg (150 lb 5.7 oz)   Exam:  General: alert & oriented x 3, afebrile and In NAD Cardiovascular: Regular rate, S1 and S2, no rubs or gallops Respiratory: CTA bilaterally Abdomen: soft +BS NT/ND, no masses palpable Extremities: hand with dressings. LE with dressing; no signs of overt bleeding or drainage.    Data Reviewed: Basic Metabolic Panel:  Recent Labs Lab 08/31/13 0540 09/01/13 0525 09/02/13 0647 09/04/13 0500  NA 137 137 135 137  K 3.4* 3.9 3.5 3.7  CL 99 98 96 98  CO2 28 27 28 29   GLUCOSE 92 96 90 93  BUN 45* 46* 48* 41*  CREATININE 2.85* 2.91* 2.60* 2.26*  CALCIUM 7.8* 8.3* 8.6 8.6   CBC:  Recent Labs Lab 08/31/13 0540 09/01/13 0525 09/02/13 0647 09/03/13 1805 09/04/13 0500  WBC 22.6* 22.0* 21.5* 20.6* 19.4*  HGB 7.5* 8.3* 8.5* 8.5* 9.0*  HCT 23.3* 24.8* 25.8* 25.6* 27.2*  MCV 94.3 92.5 93.5 93.1 93.5  PLT 323 348 423* 498* 540*    Studies: No results found.  Scheduled Meds: . chlorhexidine  15 mL Mouth/Throat BID  . docusate sodium  100 mg Oral BID  . ertapenem  1 g Intravenous Daily  . feeding supplement (ENSURE COMPLETE)  237 mL Oral BID BM  . metoprolol tartrate  25 mg Oral TID  .  pantoprazole  40 mg Oral Q1200   Continuous Infusions: . 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 If 7PM-7AM, please contact night-coverage at [email or web address], password [facility] 09/06/2013, 10:20 PM  LOS: 33 days

## 2013-09-06 NOTE — Progress Notes (Signed)
Physical Therapy Treatment Patient Details Name: Fernado Brigante MRN: [id number] DOB: [birth year] Today's Date: 09/06/2013 Time: 0920-0950 PT Time Calculation (min): 30 min  PT Assessment / Plan / Recommendation  History of Present Illness 52 year old male presented to [facility] 10/28 with AMS/Sepsis in setting of cocaine /oxycodone use. Pt admitted with Respiratory Failure (ARDS)/Sepsis has developed acute renal failure, thrombocytopenia, a-fib Flutter with RVR and was transferred to [facility] on 10/31 for HD and further care. 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   PT Comments   Pt making good progress with PT.  Recommend 2nd person for longer gait for IV pole and chair follow due to PT needing to A with progression of RW.  Follow Up Recommendations  CIR     Does the patient have the potential to tolerate intense rehabilitation     Barriers to Discharge        Equipment Recommendations       Recommendations for Other Services    Frequency Min 4X/week   Progress towards PT Goals Progress towards PT goals: Progressing toward goals  Plan Current plan remains appropriate    Precautions / Restrictions Precautions Precautions: Fall Other Brace/Splint: Bilateral Darco shoes Restrictions RUE Weight Bearing: Weight bearing as tolerated (through forearm/elbow) LUE Weight Bearing: Weight bearing as tolerated (through forearm/elbow) Other Position/Activity Restrictions: WB through heels in Darco shoes bilaterally   Pertinent Vitals/Pain R foot pain 6/10.  Pt hit call button to request pain meds.    Mobility  Bed Mobility Supine to Sit: 5:  Supervision;HOB elevated Sitting - Scoot to Edge of Bed: 5: Supervision Transfers Transfers: Sit to Stand;Stand to Sit Sit to Stand: From bed;From elevated surface;3: Mod assist Stand to Sit: 2: Max assist;To chair/3-in-1 Details for Transfer Assistance: Bed elevated to stand.  Poorly controlled descent to recliner despite built up seat surface.  Cues with transfers for proper WB. Ambulation/Gait Ambulation/Gait Assistance: 2: Max assist Ambulation Distance (Feet): 5 Feet Assistive device: Bilateral platform walker Ambulation/Gait Assistance Details: Pt required A to progress walker and turn.  Pt ambulated from bed to recliner placed against wall. Gait Pattern: Step-to pattern;Decreased step length - right;Decreased step length - left;Decreased stance time - right;Decreased stance time - left;Trunk flexed General Gait Details: A to progress PFRW    Exercises     PT Diagnosis:    PT Problem List:   PT Treatment Interventions:     PT Goals (current goals can now be found in the care plan section) Acute Rehab PT Goals Patient Stated Goal: to go to rehab Potential to Achieve Goals: Good  Visit Information  Last PT Received On: 09/06/13 Assistance Needed: +2 (+1 for transfers +2 for gait) History of Present Illness: 52 year old male presented to [facility] 10/28 with AMS/Sepsis in setting of cocaine /oxycodone use. Pt admitted with Respiratory Failure (ARDS)/Sepsis has developed acute renal failure, thrombocytopenia, a-fib Flutter with RVR and was transferred to [facility] on 10/31 for HD and further care. Pt with multisystem organ failure requiring dialysis; intubated until 11/09; had punch biopsy of skin lesion Rt antecubital space that later required I&D by ortho due to incr drainage; + DIC with septic  pulmonary emboli and necrotizing of tips of all his digits (also due to use of vasopressors); cultures + for Fusobacterium Necrophorum. 11/26 left transmet amputation and right foot toes  amp    Subjective Data  Subjective: "I'm going to do this." Patient Stated Goal: to go to rehab   Cognition  Cognition Arousal/Alertness: Awake/alert Behavior During Therapy: WFL for tasks assessed/performed Overall Cognitive Status: Within Functional Limits for tasks assessed    Balance  Static Standing Balance Static Standing - Balance Support: Bilateral upper extremity supported Static Standing - Level of Assistance: 4: Min assist Static Standing - Comment/# of Minutes: 3  End of Session PT - End of Session Equipment Utilized During Treatment: Gait belt Activity Tolerance: Patient tolerated treatment well Patient left: in chair;with call bell/phone within reach Nurse Communication:  (Pt using call button to request pain meds.)   GP     Corazon Nickolas LUBECK 09/06/2013, 11:24 AM

## 2013-09-07 ENCOUNTER — Inpatient Hospital Stay (HOSPITAL_COMMUNITY): Payer: BC Managed Care – PPO

## 2013-09-07 LAB — BASIC METABOLIC PANEL
BUN: 40 mg/dL — ABNORMAL HIGH (ref 6–23)
CO2: 27 mEq/L (ref 19–32)
Chloride: 100 mEq/L (ref 96–112)
GFR calc Af Amer: 41 mL/min — ABNORMAL LOW (ref >90)
Potassium: 4.4 mEq/L (ref 3.5–5.1)

## 2013-09-07 LAB — CBC
HCT: 25.9 % — ABNORMAL LOW (ref 39.0–52.0)
MCH: 30.5 pg (ref 26.0–34.0)
MCHC: 32.4 g/dL (ref 30.0–36.0)
MCV: 94.2 fL (ref 78.0–100.0)
Platelets: 604 10*3/uL — ABNORMAL HIGH (ref 150–400)
RDW: 14.7 % (ref 11.5–15.5)
WBC: 21.6 10*3/uL — ABNORMAL HIGH (ref 4.0–10.5)

## 2013-09-07 MED ORDER — DSS 100 MG PO CAPS: 100.0000 mg | ORAL_CAPSULE | Freq: Two times a day (BID) | ORAL | Status: DC

## 2013-09-07 MED ORDER — AMOXICILLIN-POT CLAVULANATE 500-125 MG PO TABS: 1.0000 | ORAL_TABLET | Freq: Three times a day (TID) | ORAL | Status: DC

## 2013-09-07 MED ORDER — METOPROLOL TARTRATE 25 MG PO TABS: 25.0000 mg | ORAL_TABLET | Freq: Three times a day (TID) | ORAL | Status: DC

## 2013-09-07 MED ORDER — OXYCODONE-ACETAMINOPHEN 10-325 MG PO TABS: 1.0000 | ORAL_TABLET | Freq: Four times a day (QID) | ORAL | Status: DC | PRN

## 2013-09-07 MED ORDER — ENSURE COMPLETE PO LIQD: 237.0000 mL | Freq: Two times a day (BID) | ORAL | Status: DC

## 2013-09-07 MED ORDER — CHLORHEXIDINE GLUCONATE 4 % EX LIQD
CUTANEOUS | Status: AC
  Administered 2013-09-07: 15:00:00
  Filled 2013-09-07: qty 30

## 2013-09-07 MED ORDER — PANTOPRAZOLE SODIUM 40 MG PO TBEC: 40.0000 mg | DELAYED_RELEASE_TABLET | Freq: Every day | ORAL | Status: DC

## 2013-09-07 MED ORDER — ALBUTEROL SULFATE HFA 108 (90 BASE) MCG/ACT IN AERS: 1.0000 | INHALATION_SPRAY | Freq: Four times a day (QID) | RESPIRATORY_TRACT | Status: AC | PRN

## 2013-09-07 MED ORDER — TRAZODONE HCL 50 MG PO TABS: 50.0000 mg | ORAL_TABLET | Freq: Every evening | ORAL | Status: DC | PRN

## 2013-09-07 MED ORDER — FERROUS SULFATE 324 (65 FE) MG PO TBEC: 1.0000 | DELAYED_RELEASE_TABLET | Freq: Three times a day (TID) | ORAL | Status: AC

## 2013-09-07 NOTE — Progress Notes (Signed)
Report call to [facility] at [phone].

## 2013-09-07 NOTE — Procedures (Signed)
Successful removal of (L)IJ tunneled PICC. No complications.  Brayton El PA-C Interventional Radiology 09/07/2013 3:47 PM

## 2013-09-07 NOTE — Progress Notes (Signed)
Pt o4x, states lt hand pain, PRN given. No Complaints of CP or sob. Will continue to monitor

## 2013-09-07 NOTE — Progress Notes (Signed)
[facility]   Subjective  - He feels better everyday.  He is walking with assitants.   Objective 109/72 77 97.9 F (36.6 C) (Oral) 18 98%  Intake/Output Summary (Last 24 hours) at 09/07/13 1002 Last data filed at 09/07/13 0900  Gross per 24 hour  Intake   1130 ml  Output   3375 ml  Net  -2245 ml    Bilateral amputation sites are healing well. No active drainage on the right foot lateral incision today. Clean dry dressing applied today.  Assessment/Planning: Procedure(s): Right Index Finger Partial Amputation Left Hand All Fingers Partial Amputations  8 Days Post-OpSurgeon(s): Knute Neu, MD  Right and left transmetatarsal amputation Heel weight in Darco shoes Daily dry dressing changes     Thomasena Edis, Abdoulaye Drum MAUREEN 09/07/2013 10:02 AM --  Laboratory Lab Results:  Recent Labs  09/07/13 0500  WBC 21.6*  HGB 8.4*  HCT 25.9*  PLT 604*   BMET  Recent Labs  09/07/13 0500  NA 137  K 4.4  CL 100  CO2 27  GLUCOSE 95  BUN 40*  CREATININE 2.04*  CALCIUM 8.6    COAG Lab Results  Component Value Date   INR 1.19 08/14/2013   INR 1.36 08/08/2013   INR 1.55* 08/06/2013   No results found for this basename: PTT

## 2013-09-07 NOTE — Discharge Summary (Signed)
Physician Discharge Summary  Timothy Kline ZOX:[id number] DOB: [birth year] DOA: 08/04/2013  PCP: No primary provider on file.  Admit date: 08/04/2013 Discharge date: 09/07/2013  Time spent: >30 minutes  Recommendations for Outpatient Follow-up:  1. CBC and BMET in 1 week (follow electrolytes, renal function, Hgb and WBC's trend) 2. Start low dose B-blocker and ACE inhibitor for dilated cardiomyopathy once BP and renal function stable 3. Needs follow up with EP(electrophysiology) cardiologist for ICD and further evaluation and treatment of recent A fib with RVR.  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 Pain well controlled. Will discharge to [facility] for physical rehabilitation.  Diet recommendation: low sodium heart healthy diet  Filed Weights   09/05/13 0620 09/06/13 0547 09/07/13 0606  Weight: 70.716 kg (155 lb 14.4 oz) 68.2 kg (150 lb 5.7 oz) 68.9 kg (151 lb 14.4 oz)    History of present illness:  52 year old male presented to [facility] 10/28 with AMS/Sepsis in setting of cocaine /oxycodone use. Pt admitted with Respiratory Failure (ARDS)/Sepsis has developed acute renal failure, thrombocytopenia, a-fib Flutter with RVR and was transferred to [facility] on 10/31 for HD and further care. CCM asked to admit.   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  -pain is well controlled  -plan is to continue supportive care and to follow with hand surgery and vascular surgery after discharge   -CIR consulted; but insurance has denied inpatient rehab. At this moment will require SNF for rehabilitation and further care  -Clear by Dr Myra Gianotti to be discharge (will follow in 10 days).  -Patient needs to follow up with Dr Izora Ribas (will follow next week). Dr Izora Ribas recommend to continue with dressing of left hand unless get wet. ( If it get wet, need to have dressing changes (wash with soap and water, apply ointment antibiotics and keep them cover).   Acute renal failure  - CRRT started 10/31 transitioned to intermittent dialysis Last dialysis 11/9.  - Renal followed and signed off on 11/12  -Cr at 2.04. Stable.  -avoid nephrotoxic agents and follow BMET 1 week as an outpatient.   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 -Discussed case with Dr Ninetta Lights due to persistent of elevated WBC, will continue with IV INVAZ for 2 more weeks. Patient needs to follow up with ID after discharge (1 week) and will continue augmentin PO for 3 more days.  -CBC in one  week to follow Hgb and WBC's trend   Multifocal cavitary pneumonia /Acute respiratory failure/ARDS  -Patient was on the vent through 11/10 and he was extubated, respiratory status remained stable. Abx as above.  -no fever  -continue PRN albuterol  -advise to quit smoking   Thrombocytopenia with coagulopathy  -  Secondary to Sepsis and DIC.  - As treated with antibiotics as discussed above, hematology was consulted and no further rec's at this moment  -platelets now within normal limits.   Anemia/Heme + stool; acute illness.  - Per GI his anemia is multifactorial and endoscopic evaluation would not change his clinical outcome at this time.  -no further overt bleeding appreciated  - continue to monitor hgb and transfuse as appropriate  -S/P one unit PRBC transfusion 11-27.  -At discharge Hgb 8.4 -discharged on ferrous sulfate -continue PPI   a-fib Flutter with RVR  - S/P Cardioversion for Afib/Flutter rate 170's ( Unsuccessful; on 10/31).  - also was treated with Amiodarone for Afib/Flutter RVR now dc'ed  - rate remained controlled on current metoprolol TID  -will required follow up with cardiology EP in outpatient setting for further evaluation and treatment  -patient might required ICD if EF failed to improve (outpatient follow up with EP cardiologist)  Cardiomyopathy-  -Echocardiogram: LVEF 20-25%.  - With increased peripheral edema noted, patient was followed and volume managed with dialysis per renal through 11/9  - Not on diuretics as this has been limited by Hypotension and poor renal function. -improving but Cr still elevated -recommendations is for low sodium diet and close follow up of renal function after discharge to start low dose diuretics and eventually low dose ACE -ARB/ACE not on board at discharge due to renal failure.   Hypernatremia and hypokalemia - resolved.   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  -per ID rec's will treat for 3 more days using Augmentin and they will follow patient in 1 week for further recommendations.  Rash/Thrombotic vasculopathic reaction  -s/p Skin biopsy per derm on 11/1;  thrombotic vasculopathic reaction.  -no further workup needed   Diarrhea  -C Diff negative11/06  -resolved.   Hypokalemia  Repleted.   cocaine /tobacco abuse  -counseling provided  -patient will be discharge to SNF and will require close follow up on his addiction once physically rehabilitated   Hepatitis C  -patient will follow with ID in 1 week.   Procedures, significant events and studies: 10/28 Admission to [facility]  10/29 Metabolic Acidosis, pH=6.9  10/31 Transferred to [facility] for HD.  10/31 Cardioversion for Afib/Flutter rate 170's ( Unsuccessful). Amiodarone initiated for Afib/Flutter RVR  10/31 Renal consult: CRRT initiated  10/31 Heme consult: TCP likely due to DIC. Doubt TTP/HUS  11/01 Echocardiogram: LVEF 20-25%. Diffuse HK  11/01 ID consult  11/01 Skin biopsy: thrombotic vasculopathic reaction  11/2 tolerated HD, off pressors. Platelet transfusion for thrombocytopenia  11/3 CXR with new pulmonary opacities/nodules. Concern for septic emboli.  11/03 CT chest: Multilobar bilateral areas of pulmonary parenchymal nodular consolidation with internal necrosis/ cavitation and suggestion of surrounding hazy opacity. The appearance is most typical for septic emboli  11/03 CT head: NAD  11/03 BCx from [place] 2/2 + for FUSOBACTERIUM NECROPHORUM.  11/04 CT neck (noncontrasted): No evidence of jugular vein thrombosis, abscesses, inflammation.  11/05 Jugular vein [place]: No thrombus noted  11/04 Converted to NSR  from Afib/Aflutter  11/05 Dexmedetomidine initiated in anticipation of extubation soon.  11/06 C Diff negative, Tracheal aspirate few yeast  11/06 Continued fevers, restart vanc for possible MRSA PNA  11/07 Sinus Tract R antecubital arm at site of previous punch bx discovered  11/08 MRI R forearm/elbow/humerus: inadequate study due to inability to cooperate  11/09 Debridement and lavage of RUE wound (Coley). No joint space infxn  11/09 Hypotension > norepi. Acute  blood loss anemia (probably post op) > 2 units RBCs. Weaned off norepi after RBCs  11/09 Vasc surg consult for digital gangrene. No acute intervention, allow demarcation, possible amputation in future  11/10 Extubated, tolerated well  11/10 Pt in PAF despite amiodarone  11/11 1 u pRBC transfused  11/12 NSR, restart Metoprolol  11/13 FOBT +, Hgb to 6.6  11/14 Transfused 2 u pRBCs  11/14 GI consult for possible UGI vs LGI bleed    Consultations:  GI - s/o   Vascular   ID - s/o   Renal - s/o   Heme/onc - s/o   Dermatology - s/o   cardiololgy - s/o  Discharge Exam: Filed Vitals:   09/07/13 1100  BP: 111/71  Pulse: 75  Temp:   Resp: 19   General: alert & oriented x 3, afebrile and In NAD  Cardiovascular: Regular rate, S1 and S2, no rubs or gallops  Respiratory: CTA bilaterally  Abdomen: soft +BS NT/ND, no masses palpable  Extremities: hand with dressings. LE with dressing; no signs of overt bleeding or drainage.    Discharge Instructions  Discharge Orders   Future Appointments Provider Department Dept Phone   09/11/2013 2:00 PM Randall Hiss, MD [facility] [phone]   Future Orders Complete By Expires   Discharge instructions  As directed    Comments:     Take medications as prescribed Follow with vascular surgery and hand surgery as instructed Follow a low sodium diet heart healthy diet Close follow up to patient weight (> 3 pounds overnight and/or > 5 pounds per week, notified provider immediately for further instructions/orders)       Medication List         albuterol 108 (90 BASE) MCG/ACT inhaler  Commonly known as:  PROVENTIL HFA;VENTOLIN HFA  Inhale 1 puff into the lungs every 6 (six) hours as needed for wheezing or shortness of breath.     amoxicillin-clavulanate 500-125 MG per tablet  Commonly known as:  AUGMENTIN  Take 1 tablet (500 mg total) by mouth 3 (three) times daily. For 3 more days and stop  antibiotics.     DSS 100 MG Caps  Take 100 mg by mouth 2 (two) times daily.     feeding supplement (ENSURE COMPLETE) Liqd  Take 237 mLs by mouth 2 (two) times daily between meals.     ferrous sulfate 324 (65 FE) MG Tbec  Take 1 tablet (325 mg total) by mouth 3 (three) times daily.     gabapentin 600 MG tablet  Commonly known as:  NEURONTIN  Take 600 mg by mouth 3 (three) times daily.     metoprolol tartrate 25 MG tablet  Commonly known as:  LOPRESSOR  Take 1 tablet (25 mg total) by mouth 3 (three) times daily.     oxyCODONE-acetaminophen 10-325 MG per tablet  Commonly known as:  PERCOCET  Take 1 tablet by mouth every 6 (six) hours as needed for pain.     pantoprazole 40 MG tablet  Commonly known  as:  PROTONIX  Take 1 tablet (40 mg total) by mouth daily at 12 noon.     traZODone 50 MG tablet  Commonly known as:  DESYREL  Take 1 tablet (50 mg total) by mouth at bedtime as needed for sleep (insomnia).       No Known Allergies     Follow-up Information   Follow up with Myra Gianotti IV, Lala Lund, MD In 10 days.   Specialty:  Vascular Surgery   Contact information:   [address] [place] [address] [phone]       Follow up with Molinda Bailiff, MD In 1 week.   Specialty:  General Surgery   Contact information:   [address] [place] [place] [address] [phone]       Follow up with Acey Lav, MD In 1 week.   Specialty:  Infectious Diseases   Contact information:   [address] [place] [address] [phone]       Call Hillis Range, MD. (to set up appointment)    Specialty:  Cardiology   Contact information:   [address] [place] [address] [phone]        The results of significant diagnostics from this hospitalization (including imaging, microbiology, ancillary and laboratory) are listed below for reference.    Significant Diagnostic Studies: Dg Chest 2  View  08/31/2013   CLINICAL DATA:  Leukocytosis.  Mild cough and possible congestion.  EXAM: CHEST  2 VIEW  COMPARISON:  08/22/2013 and chest CT 08/07/2013.  FINDINGS: Left IJ central venous catheter has tip overlying the SVC just below the level of the carina. Lungs are adequately inflated with continued patchy nodular airspace opacification bilaterally over the mid to lower lungs without significant change from 08/07/2013 but improved compared to 08/22/2013. Evidence of pleural fluid posteriorly on the lateral film. Cardiomediastinal silhouette and remainder of the exam is unchanged.  IMPRESSION: Continued nodular airspace process over the mid to lower lungs with improvement compared to 08/22/2013 likely representing ongoing infectious process. Small effusions.  Left IJ central venous catheter with tip overlying the SVC.   Electronically Signed   By: Elberta Fortis M.D.   On: 08/31/2013 13:09   Ct Soft Tissue Neck Wo Contrast  08/08/2013   CLINICAL DATA:  Sepsis. Acute renal failure.  Rule out neck abscess.  EXAM: CT NECK WITHOUT CONTRAST  TECHNIQUE: Multidetector CT imaging of the neck was performed following the standard protocol without intravenous contrast.  COMPARISON:  None.  FINDINGS: Lack of intravenous contrast to the limitation of this examination.  Right jugular central venous catheter is noted extending into the right innominate vein with tip not visualized. The right jugular vein does not appear distended or ill-defined however the study cannot exclude venous thrombosis without intravenous contrast. Ultrasound of the neck may be helpful to evaluate for jugular vein thrombosis. Superior mediastinum does not show any mass or fluid collection.  Negative for fluid collection or abscess in the neck. No mass lesion is identified. No edema in the neck.  The patient is intubated. NG tube is in place.  Mild cervical degenerative changes. No acute bony change in the cervical spine. Extensive dental  infection is noted with multiple caries and bony changes of chronic dental infection.  IMPRESSION: Right Jugular central venous catheter. No definite evidence of jugular venous thrombosis however this study is limited to evaluate for venous thrombosis without intravenous contrast. Consider followup neck ultrasound.  Negative for mass, edema, or abscess in the neck.   Electronically Signed   By: Marlan Palau M.D.   On: 08/08/2013 15:29   Ir Fluoro Guide Cv Line Left  08/23/2013   INDICATION: 52 year old with sepsis and renal insufficiency.  EXAM: FLUOROSCOPIC AND ULTRASOUND GUIDED PLACEMENT OF A TUNNELED CENTRAL VENOUS CATHETER  Physician: Rachelle Hora. Henn, MD  MEDICATIONS: None  ANESTHESIA/SEDATION: None  FLUOROSCOPY TIME:  2 min and 12 seconds  PROCEDURE: Informed consent was obtained for placement of a tunneled central vena catheter. The patient was placed supine on the interventional table. Ultrasound confirmed a patent left internal jugularvein. Patient already had a right jugular catheter in place. Ultrasound images were obtained for documentation. The left side of the neck was prepped and draped in a sterile fashion. The left side of the neck was anesthetized with 1% lidocaine. Maximal barrier sterile technique was utilized including caps, mask, sterile gowns, sterile gloves, sterile drape, hand hygiene and skin antiseptic. A small incision was made with #11 blade scalpel. A 21 gauge needle directed into the left internal jugular vein with ultrasound guidance. A micropuncture dilator set was placed. A dual lumen Powerline catheter was selected. The skin below the left clavicle was anesthetized and a small incision was made with an #11 blade scalpel. A subcutaneous tunnel was formed to the vein dermatotomy site. The catheter was brought through the tunnel. The cuff was placed underneath the skin. The vein dermatotomy site was dilated to accommodate a peel-away sheath. The catheter was placed through the  peel-away sheath and directed into the central venous structures. The tip of the catheter was placed in the lower SVC with fluoroscopy. Fluoroscopic images were obtained for documentation. Both lumens were found to aspirate and flush well. The vein dermatotomy site was closed using Dermabond. The catheter was secured to the skin using Prolene suture.  COMPLICATIONS: None  FINDINGS: Catheter tip in the lower SVC.  IMPRESSION: Successful placement of a tunneled central venous catheter using ultrasound and fluoroscopic guidance.   Electronically Signed   By: Richarda Overlie M.D.   On: 08/23/2013 12:19   Ir US Guide Vasc Access Left  08/23/2013   INDICATION: 52 year old with sepsis and renal insufficiency.  EXAM: FLUOROSCOPIC AND ULTRASOUND GUIDED PLACEMENT OF A TUNNELED CENTRAL VENOUS CATHETER  Physician: Rachelle Hora. Henn, MD  MEDICATIONS: None  ANESTHESIA/SEDATION: None  FLUOROSCOPY TIME:  2 min and 12 seconds  PROCEDURE: Informed consent was obtained for placement of a tunneled central vena catheter. The patient was placed supine on the interventional table. Ultrasound confirmed a patent left internal jugularvein. Patient already had a right jugular catheter in place. Ultrasound images were obtained for documentation. The left side of the neck was prepped and draped in a sterile fashion. The left side of the neck was anesthetized with 1% lidocaine. Maximal barrier sterile technique was utilized including caps, mask, sterile gowns, sterile gloves, sterile drape, hand hygiene and skin antiseptic. A small incision was made with #11 blade scalpel. A 21 gauge needle directed into the left internal jugular vein with ultrasound guidance. A micropuncture dilator set was placed. A dual lumen Powerline catheter was selected. The skin below the left clavicle was anesthetized and a small incision was made with an #11 blade scalpel. A subcutaneous tunnel was formed to the vein dermatotomy site. The catheter was brought through the  tunnel. The cuff was placed underneath the skin. The vein dermatotomy site was dilated to accommodate a peel-away sheath. The catheter was placed  through the peel-away sheath and directed into the central venous structures. The tip of the catheter was placed in the lower SVC with fluoroscopy. Fluoroscopic images were obtained for documentation. Both lumens were found to aspirate and flush well. The vein dermatotomy site was closed using Dermabond. The catheter was secured to the skin using Prolene suture.  COMPLICATIONS: None  FINDINGS: Catheter tip in the lower SVC.  IMPRESSION: Successful placement of a tunneled central venous catheter using ultrasound and fluoroscopic guidance.   Electronically Signed   By: Richarda Overlie M.D.   On: 08/23/2013 12:19   Dg Chest Port 1 View  08/22/2013   CLINICAL DATA:  Check the position of central line.  EXAM: PORTABLE CHEST - 1 VIEW  COMPARISON:  08/15/2013, 08/14/2013.  FINDINGS: There is a right-sided jugular central venous catheter with the tip projecting over the SVC IJ confluence. Marland Kitchen Heart size and mediastinum are stable.  There are bilateral nodular airspace opacities primarily involving the lower lungs with some of these opacities demonstrating cavitation. The overall appearance is most consistent with septic emboli.  IMPRESSION: Right-sided jugular central venous catheter with the tip projecting over the SVC IJV confluence.  Bilateral nodular airspace opacities again noted in the lower lungs most consistent with septic emboli.   Electronically Signed   By: Elige Ko   On: 08/22/2013 09:19   Dg Chest Port 1 View  08/15/2013   CLINICAL DATA:  Evaluate pulmonary edema  EXAM: PORTABLE CHEST - 1 VIEW  COMPARISON:  08/14/2013; 08/10/2013; 08/07/2013; chest CT -08/07/2013  FINDINGS: Grossly unchanged cardiac silhouette and mediastinal contours. Interval extubation and removal of enteric tube. Otherwise, stable positioning of remaining support apparatus. No pneumothorax.  Grossly unchanged bilateral mid and lower lung heterogeneous airspace opacities 1 of which within the periphery of the right lower lung appears to demonstrate central cavitation. No new focal airspace opacities. Suspect a trace left-sided effusion. Unchanged bones, including sequela of old right clavicular fracture.  IMPRESSION: 1. Interval extubation and removal of enteric tube. Otherwise, stable position of support apparatus. No pneumothorax. 2. Grossly unchanged bilateral mid and lower lung predominant nodular airspace opacities again worrisome for septic emboli.   Electronically Signed   By: Simonne Come M.D.   On: 08/15/2013 07:50   Dg Chest Port 1 View  08/14/2013   CLINICAL DATA:  Respiratory failure.  EXAM: PORTABLE CHEST - 1 VIEW  COMPARISON:  Chest radiograph 08/10/2013.  FINDINGS: ET tube terminates 2.2 cm superior to the carina. NG tube tip and side-port course inferior to the diaphragm, tip not included on this examination. Right IJ central venous catheter tip projects over the superior vena cava. Stable cardiac and mediastinal contours. No significant interval change in multiple bilateral nodular pulmonary opacities. No definite pleural effusion or pneumothorax.  IMPRESSION: ET tube terminates 2.2 cm superior to the carina  Chest radiograph with unchanged bilateral nodular pulmonary opacities, potentially representing multi focal septic emboli.   Electronically Signed   By: Annia Belt M.D.   On: 08/14/2013 07:34   Dg Chest Port 1 View  08/10/2013   CLINICAL DATA:  Respiratory failure.  EXAM: PORTABLE CHEST - 1 VIEW  COMPARISON:  Chest x-ray 08/07/2013.  FINDINGS: An endotracheal tube is in place with tip 2.8 cm above the carina. There is a right-sided internal jugular central venous catheter with tip terminating in the mid superior vena cava. A nasogastric tube is seen extending into the stomach, however, the tip of the nasogastric tube extends below the lower  margin of the image. Lung volumes  have improved slightly, now near normal. Multifocal nodular opacities are again noted throughout the mid to lower lungs bilaterally, some of which appear cavitary. No definite pleural effusions decreasing airspace consolidation throughout the left lower lobe. No evidence of pulmonary edema. Heart size is normal. Mediastinal contours are unremarkable.  IMPRESSION: 1. Support apparatus, as above. 2. Improving aeration in the left lower lobe, compatible with resolving airspace consolidation. 3. Persistent multifocal nodular opacities (some of which are cavitary) throughout the mid to lower lungs bilaterally, likely to reflect multifocal septic emboli based on comparison with recent chest CT.   Electronically Signed   By: Trudie Reed M.D.   On: 08/10/2013 06:44   Dg Abd Portable 1v  08/20/2013   CLINICAL DATA:  Generalized abdominal pain  EXAM: PORTABLE ABDOMEN - 1 VIEW  COMPARISON:  None.  FINDINGS: The bowel gas pattern is normal. No radio-opaque calculi or other significant radiographic abnormality are seen. Mild degradation of image quality due to patient motion. Right lung base opacity partly visualized. Presence or absence of air-fluid levels or free air is suboptimally evaluated on this supine projection.  IMPRESSION: Negative.   Electronically Signed   By: Christiana Pellant M.D.   On: 08/20/2013 15:27    Microbiology: No results found for this or any previous visit (from the past 240 hour(s)).   Labs: Basic Metabolic Panel:  Recent Labs Lab 09/01/13 0525 09/02/13 0647 09/04/13 0500 09/07/13 0500  NA 137 135 137 137  K 3.9 3.5 3.7 4.4  CL 98 96 98 100  CO2 27 28 29 27   GLUCOSE 96 90 93 95  BUN 46* 48* 41* 40*  CREATININE 2.91* 2.60* 2.26* 2.04*  CALCIUM 8.3* 8.6 8.6 8.6   CBC:  Recent Labs Lab 09/01/13 0525 09/02/13 0647 09/03/13 1805 09/04/13 0500 09/07/13 0500  WBC 22.0* 21.5* 20.6* 19.4* 21.6*  HGB 8.3* 8.5* 8.5* 9.0* 8.4*  HCT 24.8* 25.8* 25.6* 27.2* 25.9*  MCV 92.5  93.5 93.1 93.5 94.2  PLT 348 423* 498* 540* 604*    Signed:  Arushi Partridge  [facility] 09/07/2013, 11:43 AM

## 2013-09-07 NOTE — Progress Notes (Signed)
PA collins did daily dressing change.

## 2013-09-07 NOTE — Progress Notes (Signed)
PT Cancellation Note  Patient Details Name: Timothy Kline MRN: [id number] DOB: 10/07/1960   Cancelled Treatment:    Reason Eval/Treat Not Completed: Patient declined, no reason specified. Pt to have central line removed soon and will have to be in bed and planning for DC to SNF today. Due to these reasons pt denied getting OOB only to return to bed and states he would be willing to participate fully at another time but respectfully declines currently.   Toney Sang Beth 09/07/2013, 1:47 PM Delaney Meigs, PT [phone]

## 2013-09-08 ENCOUNTER — Telehealth: Payer: Self-pay | Admitting: Surgery

## 2013-09-08 ENCOUNTER — Telehealth: Payer: Self-pay | Admitting: *Deleted

## 2013-09-08 NOTE — Telephone Encounter (Signed)
Per patient instructions dated 08/30/13- pt needs to see VWB in 10 days.  Per conversation with JJK- pt can see NP in VWBs absence.  _______________________  09/08/13: phone #s are disconnected, mailed letter to home address, dpm

## 2013-09-08 NOTE — Telephone Encounter (Signed)
Ginger,LPN from [facility] called stating they had not received discharge orders on patients wound care.  I provided orders from [facility], [place] that patient was to had daily dry dressings applied to transmetatarsal amputation sites and to have two heel weight Darco shoes.  She voiced understanding of orders.

## 2013-09-11 ENCOUNTER — Ambulatory Visit
Admission: RE | Admit: 2013-09-11 | Discharge: 2013-09-11 | Disposition: A | Discharge status: Home / Self Care | Payer: BC Managed Care – PPO | Source: Ambulatory Visit | Attending: Infectious Disease | Admitting: Infectious Disease

## 2013-09-11 ENCOUNTER — Encounter: Payer: Self-pay | Admitting: Infectious Disease

## 2013-09-11 ENCOUNTER — Ambulatory Visit (INDEPENDENT_AMBULATORY_CARE_PROVIDER_SITE_OTHER): Payer: BC Managed Care – PPO | Admitting: Infectious Disease

## 2013-09-11 ENCOUNTER — Encounter: Payer: Self-pay | Admitting: Family

## 2013-09-11 DIAGNOSIS — IMO0002 Reserved for concepts with insufficient information to code with codable children: Secondary | ICD-10-CM

## 2013-09-11 DIAGNOSIS — F1990 Other psychoactive substance use, unspecified, uncomplicated: Secondary | ICD-10-CM

## 2013-09-11 DIAGNOSIS — J158 Pneumonia due to other specified bacteria: Secondary | ICD-10-CM | POA: Insufficient documentation

## 2013-09-11 DIAGNOSIS — L98499 Non-pressure chronic ulcer of skin of other sites with unspecified severity: Secondary | ICD-10-CM

## 2013-09-11 DIAGNOSIS — F1910 Other psychoactive substance abuse, uncomplicated: Secondary | ICD-10-CM

## 2013-09-11 DIAGNOSIS — Q8909 Congenital malformations of spleen: Secondary | ICD-10-CM

## 2013-09-11 DIAGNOSIS — N179 Acute kidney failure, unspecified: Secondary | ICD-10-CM

## 2013-09-11 DIAGNOSIS — A498 Other bacterial infections of unspecified site: Secondary | ICD-10-CM

## 2013-09-11 DIAGNOSIS — J852 Abscess of lung without pneumonia: Secondary | ICD-10-CM

## 2013-09-11 DIAGNOSIS — B1920 Unspecified viral hepatitis C without hepatic coma: Secondary | ICD-10-CM

## 2013-09-11 DIAGNOSIS — A419 Sepsis, unspecified organism: Secondary | ICD-10-CM

## 2013-09-11 DIAGNOSIS — Q8901 Asplenia (congenital): Secondary | ICD-10-CM | POA: Insufficient documentation

## 2013-09-11 DIAGNOSIS — A488 Other specified bacterial diseases: Secondary | ICD-10-CM

## 2013-09-11 DIAGNOSIS — L98494 Non-pressure chronic ulcer of skin of other sites with necrosis of bone: Secondary | ICD-10-CM

## 2013-09-11 DIAGNOSIS — I96 Gangrene, not elsewhere classified: Secondary | ICD-10-CM

## 2013-09-11 MED ORDER — AMOXICILLIN-POT CLAVULANATE 875-125 MG PO TABS: 1.0000 | ORAL_TABLET | Freq: Two times a day (BID) | ORAL | Status: DC

## 2013-09-11 NOTE — Patient Instructions (Signed)
We will check CXR today  CONTINUE AUGMENTIN BUT NOW AT DOSE OF 875/125 MG TWICE DAILY FOR 30 + DAYS AND UNTIL SEEN AGAIN BY DR. Zenaida Niece DAM FU WITH DR. VAN DAM IN ROUGHLY A MONTH AND BEFORE STOPPING THE AUGMENTIN  WOUND CARE  FU WITH HAND AND VVS SURGEONS

## 2013-09-11 NOTE — Progress Notes (Signed)
Subjective:    Patient ID: Timothy Kline, male    DOB: 03/25/1961, 52 y.o.   MRN: [id number]  HPI  52 y.o. 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  He was initially managed with very broad spectrum antibiotics in the hospital including vancomycin and meropenem then narrowed to IV Invanz was received for a protracted course before being changed over to oral Augmentin which he is currently on a dose of 500/125 3 times daily.  He was recently discharged from the hospital and now resides at  [facility].  He continues receiving wound care at his amputation sites and his surgical site in his arm. He has not yet followed up with vascular surgery or hand surgery.  He still has some dyspnea and fatigue.  Review of Systems  Constitutional: Positive for activity change, appetite change, fatigue and unexpected weight change. Negative for fever, chills and diaphoresis.  HENT: Negative for congestion, rhinorrhea, sinus pressure, sneezing, sore throat and trouble swallowing.   Eyes: Negative for photophobia and visual disturbance.  Respiratory: Positive for cough and shortness of breath. Negative for chest tightness, wheezing and stridor.   Cardiovascular: Negative for chest pain, palpitations and leg swelling.  Gastrointestinal: Negative for nausea, vomiting, abdominal pain, diarrhea, constipation, blood in stool, abdominal distention and anal bleeding.  Genitourinary: Negative for dysuria, hematuria, flank pain and difficulty urinating.  Musculoskeletal: Positive for arthralgias. Negative for back pain, joint swelling and myalgias.  Skin: Positive  for wound. Negative for color change, pallor and rash.  Neurological: Negative for tremors, weakness and light-headedness.  Hematological: Negative for adenopathy. Bruises/bleeds easily.  Psychiatric/Behavioral: Negative for behavioral problems, confusion, sleep disturbance, dysphoric mood, decreased concentration and agitation.       Objective:   Physical Exam  Constitutional: He is oriented to person, place, and time. No distress.  HENT:  Head: Normocephalic and atraumatic.  Mouth/Throat: Oropharynx is clear and moist. No oropharyngeal exudate.  Eyes: Conjunctivae and EOM are normal.  Neck: Normal range of motion. Neck supple. No JVD present.  Cardiovascular: Normal rate, regular rhythm and normal heart sounds.  Exam reveals no gallop and no friction rub.   No murmur heard. Pulmonary/Chest: Effort normal. No respiratory distress. He has decreased breath sounds in the right lower field and the left lower field. He has no wheezes.  Abdominal: He exhibits no distension and no mass. There is no tenderness. There is no rebound and no guarding.  Musculoskeletal: He exhibits no edema and no tenderness.  Lymphadenopathy:    He has cervical adenopathy.  Neurological: He is alert and oriented to person, place, and time. He has normal reflexes. He exhibits normal muscle tone. Coordination normal.  Skin: Skin is warm and dry. He is not diaphoretic. No erythema. No pallor.     Psychiatric: He has a normal mood and affect. His behavior is normal. Judgment and thought content normal.                      Assessment & Plan:   #1 Fusobacterium with septic shock and have it carried pneumonia also with arm abscess and then digital necrosis of multiple digits status post indications by vascular surgery and  hand surgery.  --Check chest x-ray today and read is concerning for new areas on the right side.  We'll ask pulmonary medicine to review this.  Would continue high-dose Augmentin  but now 875/125  twice a day until I seen again and reevaluated him. We spent greater than 40 minutes with the patient including greater than 50% of time in face to face counsel of the patient and in coordination of their care.   #2 right arm abscess: This has resolved  #3 digital necrosis status post multiple indications: Amputation site seemed to be healing well he does have 1 area on his right hand where he has distal necrosis with that has not had surgery or get out of necrosis. He needs to be followed up closely with a hand surgeon and vascular surgery.  #4 hepatitis C genotype 1B. He will need to have counseling and go into remission with regard to his of IV drug use. Certainly it would be nice to treat him with a modern oral regimen to cure him but only if he is going to abstain from repeat use of IV drugs.  #5 Asplenia: make sure that he is up to date on vaccinations

## 2013-09-12 ENCOUNTER — Ambulatory Visit (INDEPENDENT_AMBULATORY_CARE_PROVIDER_SITE_OTHER): Payer: Self-pay | Admitting: Family

## 2013-09-12 ENCOUNTER — Telehealth: Payer: Self-pay | Admitting: Infectious Disease

## 2013-09-12 ENCOUNTER — Encounter: Payer: Self-pay | Admitting: Family

## 2013-09-12 ENCOUNTER — Ambulatory Visit: Payer: BC Managed Care – PPO | Admitting: Family

## 2013-09-12 DIAGNOSIS — I739 Peripheral vascular disease, unspecified: Secondary | ICD-10-CM

## 2013-09-12 DIAGNOSIS — I7025 Atherosclerosis of native arteries of other extremities with ulceration: Secondary | ICD-10-CM | POA: Insufficient documentation
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
## 2013-09-12 NOTE — Telephone Encounter (Signed)
Tamika can you arrange for this guy to get a noncontrast CT of the chest this week to re-evaluate his lungs. Id like to do this before changing his abx. He is at [facility] in [place]  I believe (I put there name in my note)

## 2013-09-12 NOTE — Patient Instructions (Signed)
Smoking Cessation Quitting smoking is important to your health and has many advantages. However, it is not always easy to quit since nicotine is a very addictive drug. Often times, people try 3 times or more before being able to quit. This document explains the best ways for you to prepare to quit smoking. Quitting takes hard work and a lot of effort, but you can do it. ADVANTAGES OF QUITTING SMOKING  You will live longer, feel better, and live better.  Your body will feel the impact of quitting smoking almost immediately.  Within 20 minutes, blood pressure decreases. Your pulse returns to its normal level.  After 8 hours, carbon monoxide levels in the blood return to normal. Your oxygen level increases.  After 24 hours, the chance of having a heart attack starts to decrease. Your breath, hair, and body stop smelling like smoke.  After 48 hours, damaged nerve endings begin to recover. Your sense of taste and smell improve.  After 72 hours, the body is virtually free of nicotine. Your bronchial tubes relax and breathing becomes easier.  After 2 to 12 weeks, lungs can hold more air. Exercise becomes easier and circulation improves.  The risk of having a heart attack, stroke, cancer, or lung disease is greatly reduced.  After 1 year, the risk of coronary heart disease is cut in half.  After 5 years, the risk of stroke falls to the same as a nonsmoker.  After 10 years, the risk of lung cancer is cut in half and the risk of other cancers decreases significantly.  After 15 years, the risk of coronary heart disease drops, usually to the level of a nonsmoker.  If you are pregnant, quitting smoking will improve your chances of having a healthy baby.  The people you live with, especially any children, will be healthier.  You will have extra money to spend on things other than cigarettes. QUESTIONS TO THINK ABOUT BEFORE ATTEMPTING TO QUIT You may want to talk about your answers with your  caregiver.  Why do you want to quit?  If you tried to quit in the past, what helped and what did not?  What will be the most difficult situations for you after you quit? How will you plan to handle them?  Who can help you through the tough times? Your family? Friends? A caregiver?  What pleasures do you get from smoking? What ways can you still get pleasure if you quit? Here are some questions to ask your caregiver:  How can you help me to be successful at quitting?  What medicine do you think would be best for me and how should I take it?  What should I do if I need more help?  What is smoking withdrawal like? How can I get information on withdrawal? GET READY  Set a quit date.  Change your environment by getting rid of all cigarettes, ashtrays, matches, and lighters in your home, car, or work. Do not let people smoke in your home.  Review your past attempts to quit. Think about what worked and what did not. GET SUPPORT AND ENCOURAGEMENT You have a better chance of being successful if you have help. You can get support in many ways.  Tell your family, friends, and co-workers that you are going to quit and need their support. Ask them not to smoke around you.  Get individual, group, or telephone counseling and support. Programs are available at local hospitals and health centers. Call your local health department for   information about programs in your area.  Spiritual beliefs and practices may help some smokers quit.  Download a "quit meter" on your computer to keep track of quit statistics, such as how long you have gone without smoking, cigarettes not smoked, and money saved.  Get a self-help book about quitting smoking and staying off of tobacco. LEARN NEW SKILLS AND BEHAVIORS  Distract yourself from urges to smoke. Talk to someone, go for a walk, or occupy your time with a task.  Change your normal routine. Take a different route to work. Drink tea instead of coffee.  Eat breakfast in a different place.  Reduce your stress. Take a hot bath, exercise, or read a book.  Plan something enjoyable to do every day. Reward yourself for not smoking.  Explore interactive web-based programs that specialize in helping you quit. GET MEDICINE AND USE IT CORRECTLY Medicines can help you stop smoking and decrease the urge to smoke. Combining medicine with the above behavioral methods and support can greatly increase your chances of successfully quitting smoking.  Nicotine replacement therapy helps deliver nicotine to your body without the negative effects and risks of smoking. Nicotine replacement therapy includes nicotine gum, lozenges, inhalers, nasal sprays, and skin patches. Some may be available over-the-counter and others require a prescription.  Antidepressant medicine helps people abstain from smoking, but how this works is unknown. This medicine is available by prescription.  Nicotinic receptor partial agonist medicine simulates the effect of nicotine in your brain. This medicine is available by prescription. Ask your caregiver for advice about which medicines to use and how to use them based on your health history. Your caregiver will tell you what side effects to look out for if you choose to be on a medicine or therapy. Carefully read the information on the package. Do not use any other product containing nicotine while using a nicotine replacement product.  RELAPSE OR DIFFICULT SITUATIONS Most relapses occur within the first 3 months after quitting. Do not be discouraged if you start smoking again. Remember, most people try several times before finally quitting. You may have symptoms of withdrawal because your body is used to nicotine. You may crave cigarettes, be irritable, feel very hungry, cough often, get headaches, or have difficulty concentrating. The withdrawal symptoms are only temporary. They are strongest when you first quit, but they will go away within  10 14 days. To reduce the chances of relapse, try to:  Avoid drinking alcohol. Drinking lowers your chances of successfully quitting.  Reduce the amount of caffeine you consume. Once you quit smoking, the amount of caffeine in your body increases and can give you symptoms, such as a rapid heartbeat, sweating, and anxiety.  Avoid smokers because they can make you want to smoke.  Do not let weight gain distract you. Many smokers will gain weight when they quit, usually less than 10 pounds. Eat a healthy diet and stay active. You can always lose the weight gained after you quit.  Find ways to improve your mood other than smoking. FOR MORE INFORMATION  www.smokefree.gov  Document Released: 09/15/2001 Document Revised: 03/22/2012 Document Reviewed: 12/31/2011 [facility] Patient Information 2014 [place], [place].   Amputation Many new amputations occur each year. The most common causes of amputation of the lower extremity (the hip down) are:  Disease.  Injury caused in an accidents or wars (trauma).  Birth defects.  Lumps (tumors) that are cancer. Upper extremity amputation is usually the result of trauma or birth defect, with disease being  a less common cause. COMMON PROBLEMS After an amputation a number of issues need to be considered. Getting around and self-care are early problems that must be dealt with. A complete rehabilitation program will help the amputee recover mobility. A team approach of caregivers helps the most. Caregivers that can provide a well rounded program include:   Physicians.  Therapists.  Nurses.  Social workers.  Psychologists. Usually there are problems with body image and coping with lifestyle changes. A grieving period similar to dealing with a death in the family is common after an amputation. Talking to a trained professional with experience in treating people with similar problems can be very helpful. When returning to a previous lifestyle, questions  about sexuality can arise. Many of these uncertainties are normal. These can be discussed with your psychologist or rehabilitation specialist. REHABILITATION AND RETURN TO WORK AND ACTIVITIES Returning to recreational activities and employment are part of recovery. Many times, changes to recreation equipment can allow return to a sport or hobby. A device that substitutes the missing part of the body is called a prosthetic. Many prosthetic manufacturers produce components designed for sports. Be sure to discuss all of your leisure interests with your prosthetist. This is the person who helps provide you with custom made replacement limbs. Your physician will also help to select a prosthetic that will meet your needs. Employers will vary in their willingness to change a work environment in order to help people with disabilities. Your therapists can perform job site evaluations. Your therapist can then make recommendations to help with your work area. Some amputees will not be able to return to previous jobs. Your local Office of Vocational Rehabilitation can assist you in job retraining.  Once you are past the initial rehabilitation stage you will have ongoing contact with caregivers and a prosthetist. You need to work closely with them in making decisions about your prosthetic device. PROGNOSIS  Amputation should not end your joy of life. There are people with limb loss in nearly all walks of life. They are in a wide variety of professions. They participate in nearly all sports. Ask your caregivers about support groups and sports organizations in your area. They can help you with referral to organizations that will be helpful to you. Document Released: 06/13/2002 Document Revised: 12/14/2011 Document Reviewed: 08/08/2007 [facility] Patient Information 2014 [place], [place].

## 2013-09-15 ENCOUNTER — Encounter: Payer: Self-pay | Admitting: Surgery

## 2013-09-18 ENCOUNTER — Encounter: Payer: Self-pay | Admitting: Surgery

## 2013-09-18 ENCOUNTER — Ambulatory Visit (INDEPENDENT_AMBULATORY_CARE_PROVIDER_SITE_OTHER): Payer: Self-pay | Admitting: Surgery

## 2013-09-18 VITALS — BP 109/74 | HR 86 | Resp 20 | Ht 72.0 in | Wt 167.0 lb

## 2013-09-18 DIAGNOSIS — I739 Peripheral vascular disease, unspecified: Secondary | ICD-10-CM

## 2013-09-18 DIAGNOSIS — I70269 Atherosclerosis of native arteries of extremities with gangrene, unspecified extremity: Secondary | ICD-10-CM

## 2013-09-18 NOTE — Progress Notes (Signed)
The patient is here today for followup.  On 08/30/2013 he underwent ray amputation of all 5 digits on the left foot and a right transmetatarsal amputation.  The patient had developed gangrene of all 10 digits secondary to sepsis.  He is here for his first postoperative followup.  He is but is staying in rehabilitation.  On termination all incisions appear to be healing nicely.  There is no evidence of infection.  I am scheduling the patient to come back next week for suture removal.  I will also have a followup in one month.

## 2013-09-25 ENCOUNTER — Encounter: Payer: Self-pay | Admitting: Internal Medicine

## 2013-09-25 ENCOUNTER — Ambulatory Visit (INDEPENDENT_AMBULATORY_CARE_PROVIDER_SITE_OTHER): Payer: BC Managed Care – PPO | Admitting: Internal Medicine

## 2013-09-25 VITALS — BP 127/77 | HR 76 | Ht 72.0 in | Wt 133.4 lb

## 2013-09-25 DIAGNOSIS — I5021 Acute systolic (congestive) heart failure: Secondary | ICD-10-CM
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
## 2013-09-25 NOTE — Patient Instructions (Signed)
Your physician recommends that you schedule a follow-up appointment in: ONE MONTH WITH DR ROSS  Your physician recommends that you HAVE LAB WORK TODAY  Your physician has requested that you have an echocardiogram. Echocardiography is a painless test that uses sound waves to create images of your heart. It provides your doctor with information about the size and shape of your heart and how well your heart's chambers and valves are working. This procedure takes approximately one hour. There are no restrictions for this procedure.

## 2013-09-26 LAB — CBC WITH DIFFERENTIAL/PLATELET
Basophils Relative: 0.4 % (ref 0.0–3.0)
Eosinophils Absolute: 0.8 10*3/uL — ABNORMAL HIGH (ref 0.0–0.7)
Eosinophils Relative: 5.6 % — ABNORMAL HIGH (ref 0.0–5.0)
HCT: 28.8 % — ABNORMAL LOW (ref 39.0–52.0)
Lymphocytes Relative: 26.6 % (ref 12.0–46.0)
MCV: 90.3 fl (ref 78.0–100.0)
Monocytes Absolute: 1.4 10*3/uL — ABNORMAL HIGH (ref 0.1–1.0)
Monocytes Relative: 9.2 % (ref 3.0–12.0)
Neutrophils Relative %: 58.2 % (ref 43.0–77.0)
Platelets: 748 10*3/uL — ABNORMAL HIGH (ref 150.0–400.0)
RBC: 3.19 Mil/uL — ABNORMAL LOW (ref 4.22–5.81)
WBC: 14.8 10*3/uL — ABNORMAL HIGH (ref 4.5–10.5)

## 2013-09-26 LAB — BASIC METABOLIC PANEL
BUN: 20 mg/dL (ref 6–23)
Calcium: 9.4 mg/dL (ref 8.4–10.5)
Chloride: 101 mEq/L (ref 96–112)
Creatinine, Ser: 1.3 mg/dL (ref 0.4–1.5)
Potassium: 4.5 mEq/L (ref 3.5–5.1)

## 2013-10-12 ENCOUNTER — Ambulatory Visit: Payer: BC Managed Care – PPO | Admitting: Infectious Disease

## 2013-10-16 ENCOUNTER — Other Ambulatory Visit (HOSPITAL_COMMUNITY): Payer: BC Managed Care – PPO

## 2013-10-17 ENCOUNTER — Encounter (HOSPITAL_COMMUNITY): Payer: Self-pay | Admitting: Internal Medicine

## 2013-10-20 ENCOUNTER — Encounter: Payer: Self-pay | Admitting: Surgery

## 2013-10-23 ENCOUNTER — Ambulatory Visit: Payer: BC Managed Care – PPO | Admitting: Surgery

## 2013-10-26 ENCOUNTER — Ambulatory Visit: Payer: BC Managed Care – PPO | Admitting: Internal Medicine

## 2013-11-03 ENCOUNTER — Telehealth: Payer: Self-pay

## 2013-11-03 ENCOUNTER — Encounter: Payer: Self-pay | Admitting: Surgery

## 2013-11-03 ENCOUNTER — Encounter: Payer: Self-pay | Admitting: Internal Medicine

## 2013-11-03 NOTE — Telephone Encounter (Signed)
Patients family member is calling after visit with primary care physician Dr Mauricio Poegina York who stated he has "another infection". He was started on antibiotic.  We have not received any correspondence from their office.  They were told if the blister did not get better to go to the emergency room.   Since the last infection "almost killed  Timothy Kline" they family is very anxious about the new infection and would like to be seen.   I will work them in with Dr Ninetta LightsHatcher on Monday since there is no physician here today.  Patient was advised to follow original directions from primary physician and go to the ED if blisters worsen,. I will contact Dr Priscella MannYork's office for office notes and labs .  The office was closed.  Message left to fax medical records and labs for Monday's visit.    Laurell Josephsammy K Maronda Caison, RN

## 2013-11-06 ENCOUNTER — Ambulatory Visit (INDEPENDENT_AMBULATORY_CARE_PROVIDER_SITE_OTHER): Payer: BC Managed Care – PPO | Admitting: Infectious Diseases

## 2013-11-06 ENCOUNTER — Other Ambulatory Visit: Payer: Self-pay

## 2013-11-06 ENCOUNTER — Encounter: Payer: Self-pay | Admitting: Infectious Diseases

## 2013-11-06 ENCOUNTER — Ambulatory Visit (INDEPENDENT_AMBULATORY_CARE_PROVIDER_SITE_OTHER): Payer: BC Managed Care – PPO | Admitting: Surgery

## 2013-11-06 VITALS — BP 136/81 | HR 96 | Temp 98.1°F | Ht 72.0 in | Wt 165.0 lb

## 2013-11-06 DIAGNOSIS — G8918 Other acute postprocedural pain: Secondary | ICD-10-CM

## 2013-11-06 DIAGNOSIS — A498 Other bacterial infections of unspecified site: Secondary | ICD-10-CM

## 2013-11-06 DIAGNOSIS — I70269 Atherosclerosis of native arteries of extremities with gangrene, unspecified extremity: Secondary | ICD-10-CM

## 2013-11-06 DIAGNOSIS — A488 Other specified bacterial diseases: Secondary | ICD-10-CM

## 2013-11-06 DIAGNOSIS — B9689 Other specified bacterial agents as the cause of diseases classified elsewhere: Principal | ICD-10-CM

## 2013-11-06 MED ORDER — OXYCODONE-ACETAMINOPHEN 10-325 MG PO TABS: 1.0000 | ORAL_TABLET | Freq: Four times a day (QID) | ORAL | Status: AC | PRN

## 2013-11-06 MED ORDER — DOXYCYCLINE HYCLATE 100 MG PO TABS: 100.0000 mg | ORAL_TABLET | Freq: Two times a day (BID) | ORAL | Status: DC

## 2013-11-06 NOTE — Assessment & Plan Note (Signed)
I am not clear if he has infection underneath the nail bed. I will broaden his anbx from cipro alone to cipro and doxy (to add MRSA coverage). Will see him back in 2-3 weeks. Have also asked him to f/ u with hand surgery to see if nail/scab needs to be removed.

## 2013-11-06 NOTE — Progress Notes (Signed)
   Subjective:    Patient ID: Timothy Kline, male    DOB: 04/10/1961, 53 y.o.   MRN: [id number]  HPI 53 y.o. 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  He was initially managed with vancomycin and meropenem then narrowed to IV Invanz (09-07-13) before being changed over to oral Augmentin. He was continued on this (dose increased) when seen in f/u 09-12-13. Has been off augmentin since 11-02-13.   He had wound Cx showing S marcescens (R- cefazolin, I- cefoxitin) when noted to have non-healing, blistering wounds. Started on Cipro 11-03-13.   Review of Systems     Objective:   Physical Exam  Constitutional: He appears well-developed and well-nourished.  Musculoskeletal:       Arms:         Assessment & Plan:

## 2013-11-06 NOTE — Addendum Note (Signed)
Addended by: Arman Loy C on: 11/06/2013 11:42 AM   Modules accepted: Orders

## 2013-11-06 NOTE — Progress Notes (Signed)
Pt. Walked in to office thinking he had an appt.  Missed appt. Today at 9:00 AM.  Rescheduled his appt. To Mon., 11/13/13.  Pt. Requested pain medication to get by until next appt. 2/9.  C/o pain in left hand and bilateral foot pain; s/p ray amp of toes x 5 on left foot, and right transmetatarsal amp.  Of 11/26.  Rates pain at 8/10.  Discussed w/ Dr. Myra GianottiBrabham.  Authorized Percocet 10/325 mg 1 q 6hr/ prn/ #30/ no refills.

## 2013-11-07 NOTE — Progress Notes (Signed)
Pt rescheduled

## 2013-11-09 LAB — WOUND CULTURE: Gram Stain: NONE SEEN

## 2013-11-10 ENCOUNTER — Encounter: Payer: Self-pay | Admitting: Surgery

## 2013-11-13 ENCOUNTER — Ambulatory Visit (INDEPENDENT_AMBULATORY_CARE_PROVIDER_SITE_OTHER): Payer: Self-pay | Admitting: Surgery

## 2013-11-13 ENCOUNTER — Encounter: Payer: Self-pay | Admitting: Surgery

## 2013-11-13 VITALS — BP 134/100 | HR 104 | Ht 72.0 in | Wt 166.6 lb

## 2013-11-13 DIAGNOSIS — Z48812 Encounter for surgical aftercare following surgery on the circulatory system: Secondary | ICD-10-CM

## 2013-11-13 DIAGNOSIS — I70269 Atherosclerosis of native arteries of extremities with gangrene, unspecified extremity: Secondary | ICD-10-CM

## 2013-11-13 NOTE — Progress Notes (Signed)
Is back today for followup.  He is status post ray amputation of the left foot x5 and right transmetatarsal amputation.  This was secondary to dry gangrene from sepsis.  His wounds have now completely healed.  He still has significant limitations given his amputations of both his feet and the left hand and right finger.  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  His wounds are all completely healed.  He still has residual pain.  I have given him a prescription for oxycodone 5 mg 50 tablets.  I've also made a referral to the pain Center.  I am sending him to [facility] for prosthesis fitting.  He will follow with me on an as-needed basis

## 2013-11-14 NOTE — Addendum Note (Signed)
Addended by: Adria DillELDRIDGE-LEWIS, Jody Aguinaga L on: 11/14/2013 10:33 AM   Modules accepted: Orders

## 2013-11-27 ENCOUNTER — Ambulatory Visit (INDEPENDENT_AMBULATORY_CARE_PROVIDER_SITE_OTHER): Payer: BC Managed Care – PPO | Admitting: Infectious Diseases

## 2013-11-27 ENCOUNTER — Encounter: Payer: Self-pay | Admitting: Infectious Diseases

## 2013-11-27 VITALS — Temp 98.5°F | Ht 72.0 in | Wt 174.0 lb

## 2013-11-27 DIAGNOSIS — A488 Other specified bacterial diseases: Secondary | ICD-10-CM

## 2013-11-27 DIAGNOSIS — A498 Other bacterial infections of unspecified site: Secondary | ICD-10-CM

## 2013-11-27 DIAGNOSIS — B9689 Other specified bacterial agents as the cause of diseases classified elsewhere: Principal | ICD-10-CM

## 2013-11-27 NOTE — Progress Notes (Signed)
   Subjective:    Patient ID: Timothy Kline, male    DOB: 06/04/1961, [age] y.o.   MRN: [id number]  HPI 53 y.o. 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  He was initially managed with vancomycin and meropenem then narrowed to IV Invanz (09-07-13) before being changed over to oral Augmentin. He was continued on this (dose increased) when seen in f/u 09-12-13. Has been off augmentin since 11-02-13.  He had wound Cx showing S marcescens (R- cefazolin, I- cefoxitin) when noted to have non-healing, blistering wounds. Started on Cipro 11-03-13. He had ID f/u on 2-2 and there was concern for loosening of nail and worsening infection. He had doxy added to his cipro. He als f/u at Vascular surgery on 2-9 and was felt to be doing well (d/c'ed).  Today feel slike his wounds are better- sores are gone but still has skin discoloration. Has been off anbx for 1 week.  No f/c. No heat in his hand. No proximal erythema.    Review of Systems     Objective:   Physical Exam  Constitutional: He appears well-developed and well-nourished.  Musculoskeletal:       Arms:         Assessment & Plan:

## 2013-11-27 NOTE — Assessment & Plan Note (Signed)
He is doing well. He is off anbx. He will continue to watch his wound, let us know if he has f/c, increased pain, swelling. rtc prn.

## 2013-11-28 ENCOUNTER — Other Ambulatory Visit (HOSPITAL_COMMUNITY): Payer: BC Managed Care – PPO

## 2013-11-30 ENCOUNTER — Ambulatory Visit: Payer: BC Managed Care – PPO | Admitting: Internal Medicine

## 2013-12-15 ENCOUNTER — Other Ambulatory Visit (HOSPITAL_COMMUNITY): Payer: BC Managed Care – PPO

## 2013-12-18 ENCOUNTER — Ambulatory Visit (HOSPITAL_COMMUNITY): Payer: BC Managed Care – PPO | Attending: Cardiology | Admitting: Cardiology

## 2013-12-18 DIAGNOSIS — I079 Rheumatic tricuspid valve disease, unspecified: Secondary | ICD-10-CM | POA: Insufficient documentation

## 2013-12-18 DIAGNOSIS — Z87891 Personal history of nicotine dependence: Secondary | ICD-10-CM | POA: Insufficient documentation

## 2013-12-18 DIAGNOSIS — I509 Heart failure, unspecified: Secondary | ICD-10-CM | POA: Insufficient documentation

## 2013-12-18 DIAGNOSIS — I379 Nonrheumatic pulmonary valve disorder, unspecified: Secondary | ICD-10-CM | POA: Insufficient documentation

## 2013-12-18 DIAGNOSIS — I5021 Acute systolic (congestive) heart failure: Secondary | ICD-10-CM

## 2013-12-18 DIAGNOSIS — I080 Rheumatic disorders of both mitral and aortic valves: Secondary | ICD-10-CM | POA: Insufficient documentation

## 2013-12-18 DIAGNOSIS — I4891 Unspecified atrial fibrillation: Secondary | ICD-10-CM | POA: Insufficient documentation

## 2013-12-18 NOTE — Progress Notes (Signed)
Echo performed. 

## 2013-12-25 ENCOUNTER — Ambulatory Visit: Payer: BC Managed Care – PPO | Admitting: Internal Medicine

## 2015-06-12 IMAGING — CR DG CHEST 1V PORT
1 series · 1 of 1 positions shown · non-contrast
Comparison: [DATE]/[id number]

CLINICAL DATA: Check endotracheal tube

EXAM:
PORTABLE CHEST - 1 VIEW

[AP]
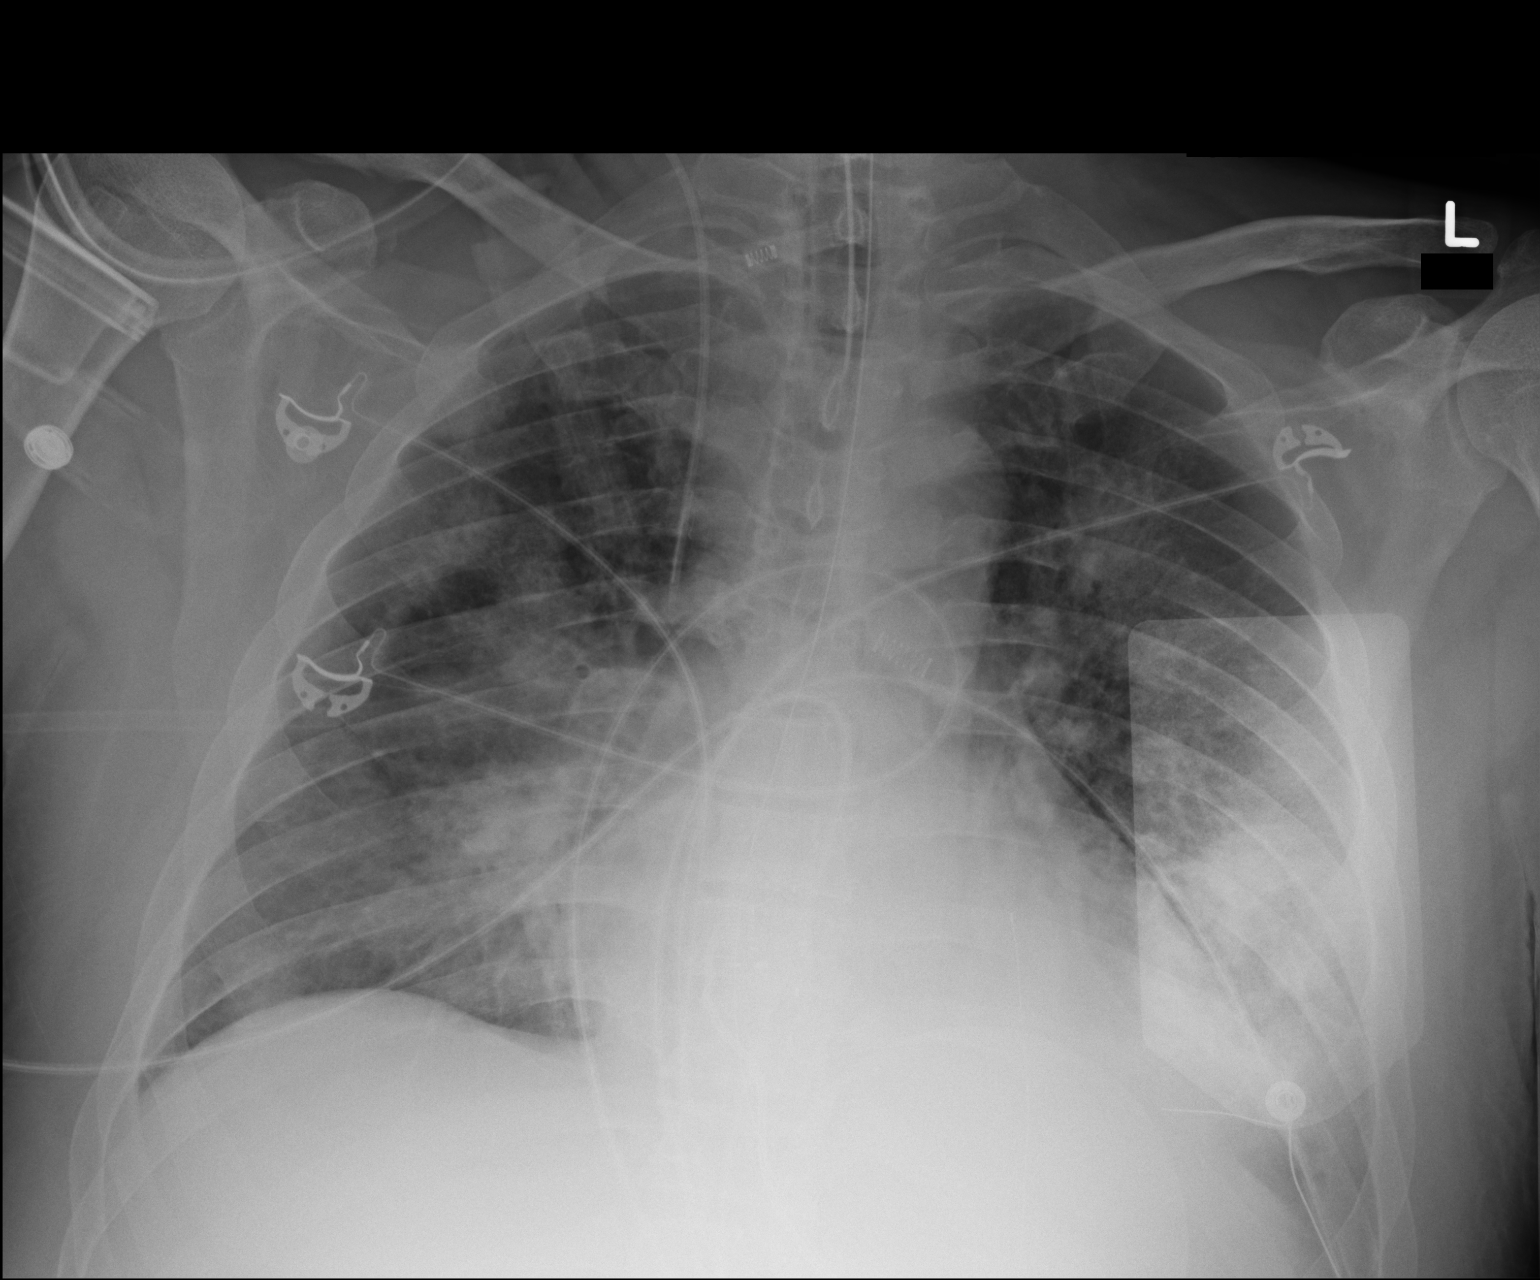

[1 of 1 positions shown; findings below may reference images not displayed]

FINDINGS: The cardiac shadow is stable. Patchy infiltrative changes are again
identified bilaterally. A right-sided central venous line,
nasogastric catheter and endotracheal tube are again seen and
stable. The endotracheal tube again lies approximately 6.4 cm above
the carinal. No other focal abnormality is seen.
IMPRESSION: Stable appearance of tubes and lines as described.

Patchy infiltrates bilaterally stable from the previous film

## 2015-06-12 IMAGING — CR DG CHEST 1V PORT
2 series · 2 of 2 positions shown · non-contrast
Comparison: 08/04/2013

CLINICAL DATA: Respiratory difficulty

EXAM:
PORTABLE CHEST - 1 VIEW

[AP (1 of 2)]
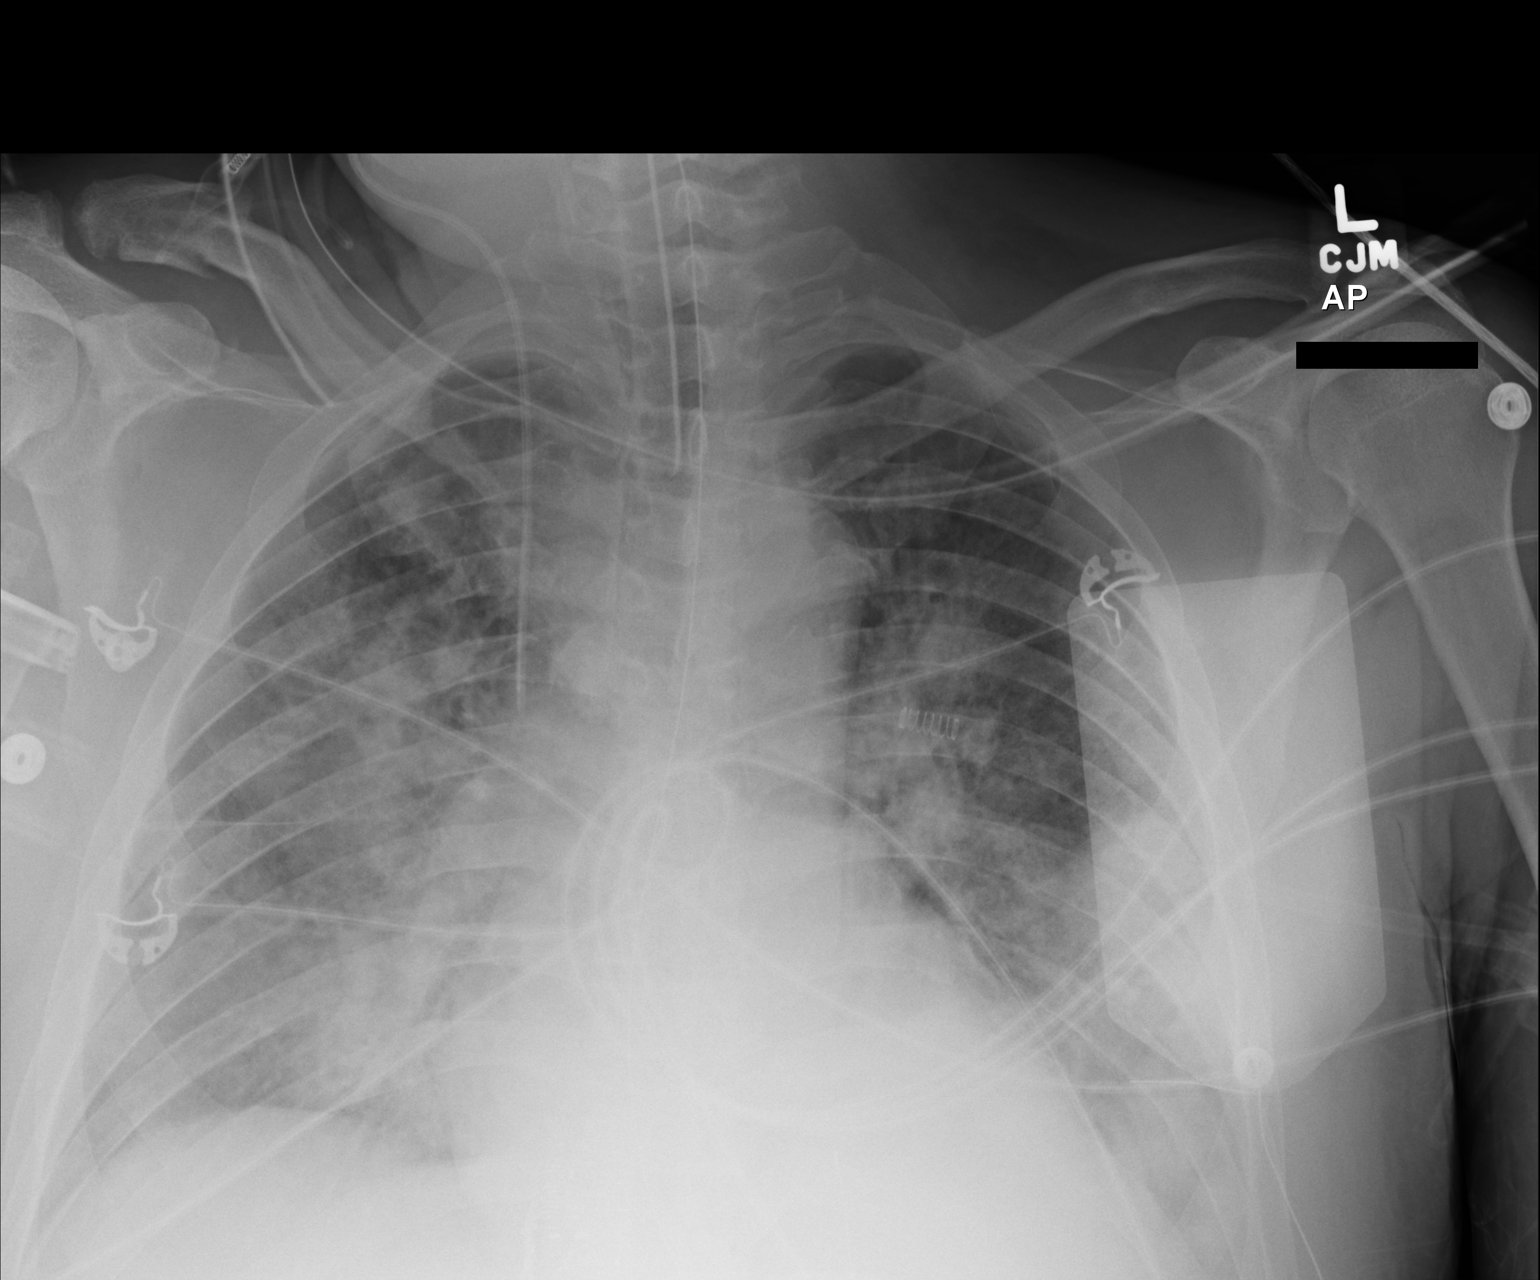

[AP (2 of 2)]
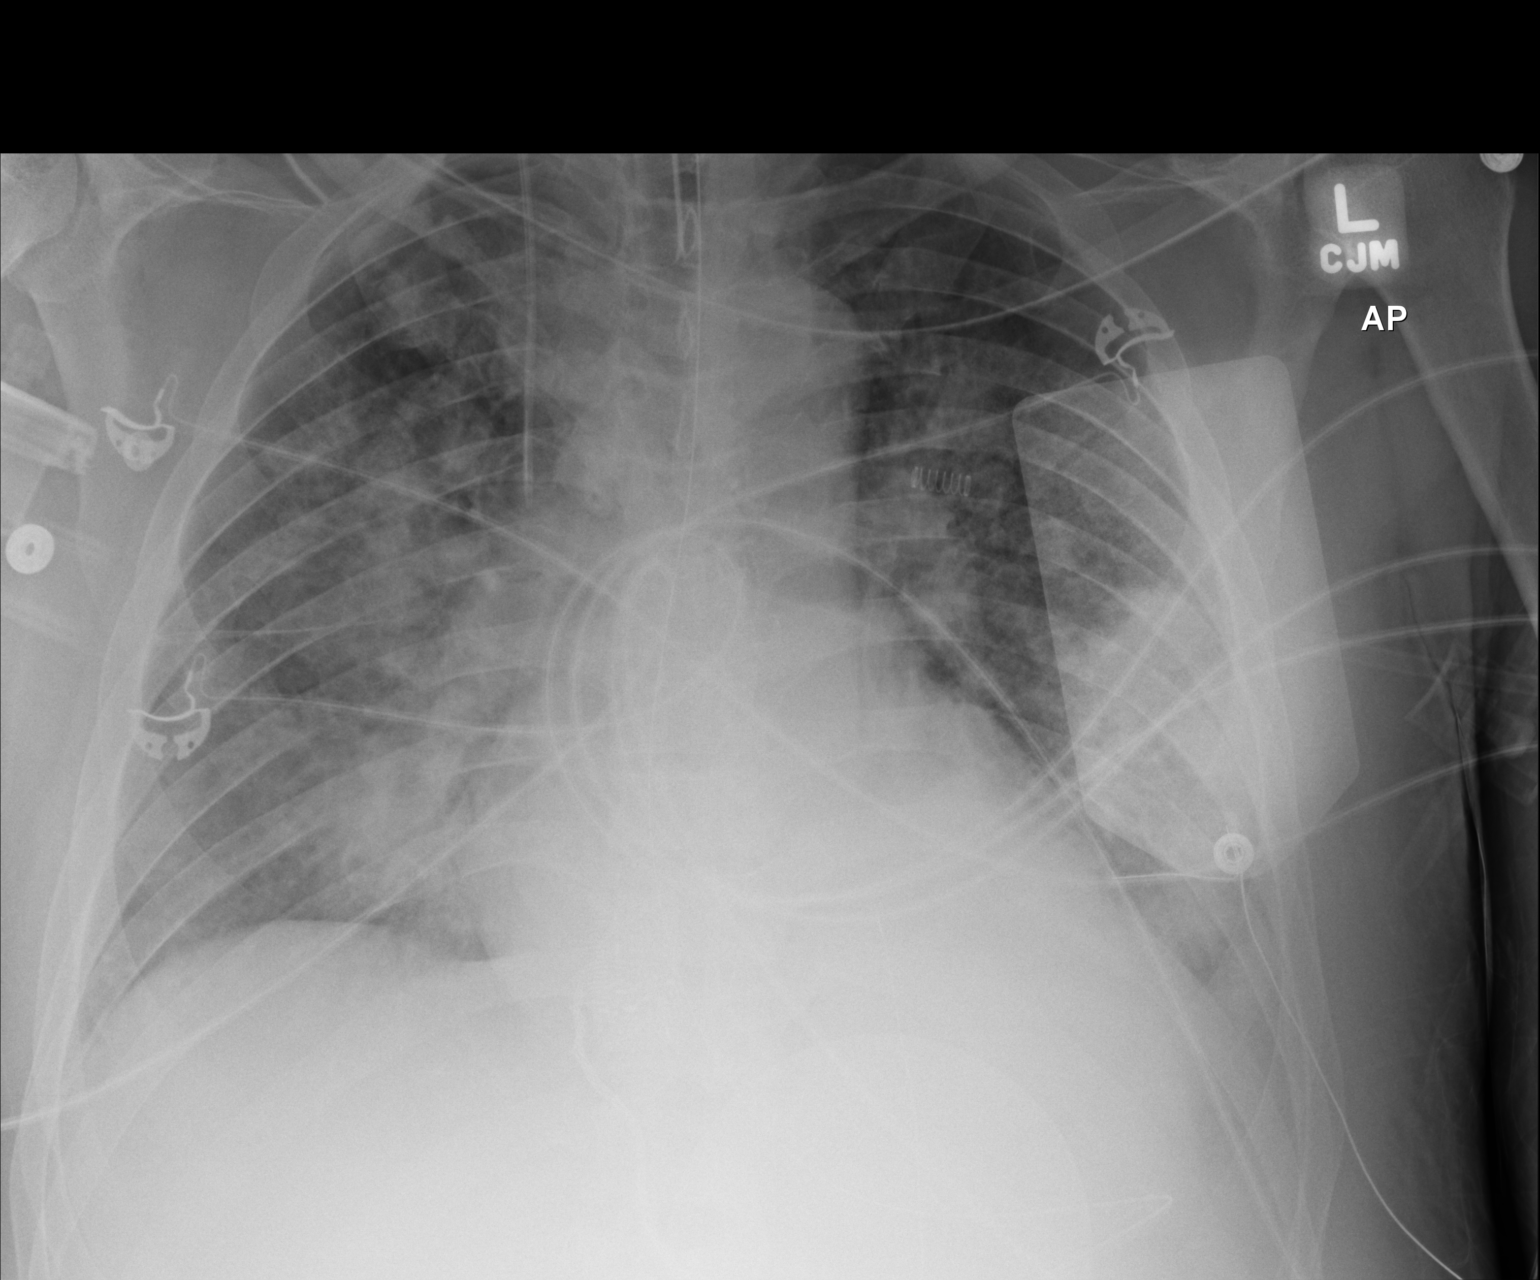

[2 of 2 positions shown; findings below may reference images not displayed]

FINDINGS: Endotracheal tube, NG tube, right internal jugular central venous
catheter are stable. The endotracheal tube remains 6.4 cm from the
carinal. Extensive patchy bilateral airspace disease is not
significantly changed. No pneumothorax.
IMPRESSION: Stable bilateral airspace disease.

## 2015-06-16 IMAGING — CT CT NECK W/O CM
4 series · 15 of 33 positions shown, 18 images · non-contrast
Comparison: None.

CLINICAL DATA: Sepsis. Acute renal failure.  Rule out neck abscess.

EXAM:
CT NECK WITHOUT CONTRAST
TECHNIQUE: Multidetector CT imaging of the neck was performed following the
standard protocol without intravenous contrast.

[Series 3: neck 2.0 i31s 3 · axial · 0.39mm/px · z∈[-253,-101]mm · 5 of 115 slices shown, 7 images]
[im 20/115  soft-tissue]
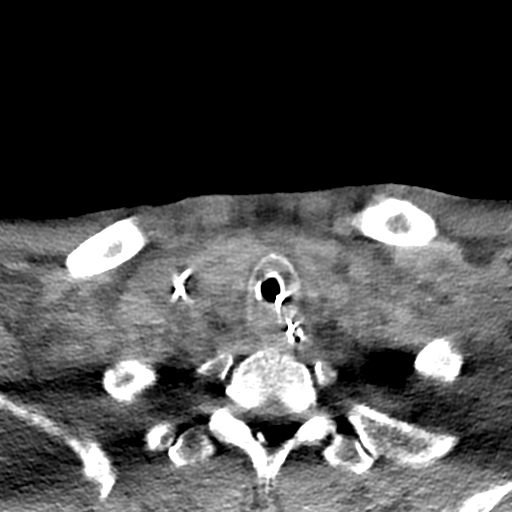
[im 20/115  bone]
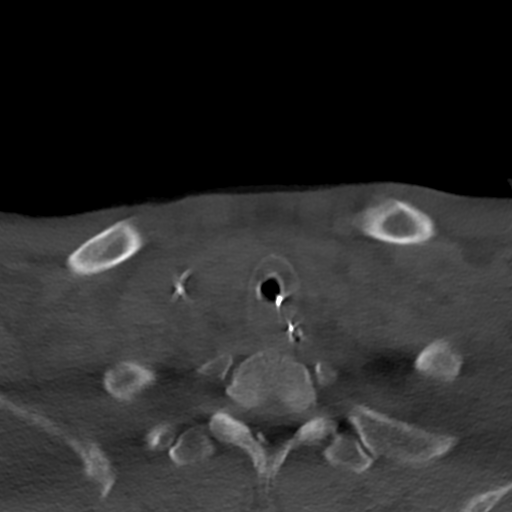
[im 39/115  bone]
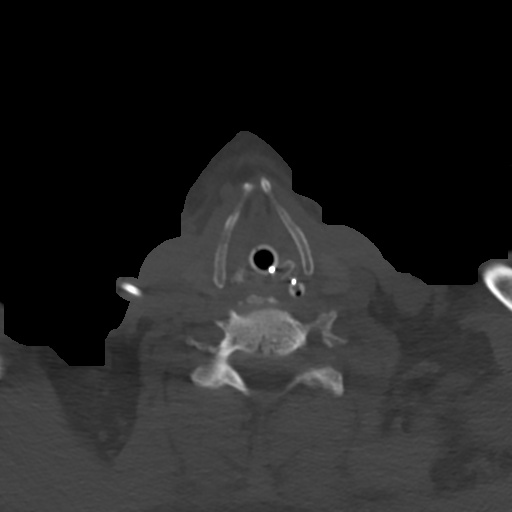
[im 58/115  bone]
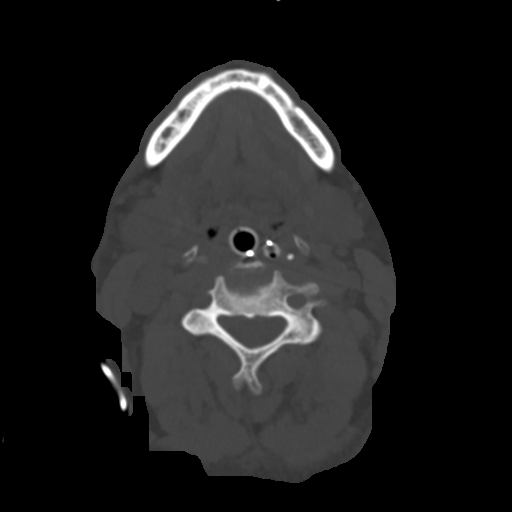
[im 77/115  bone]
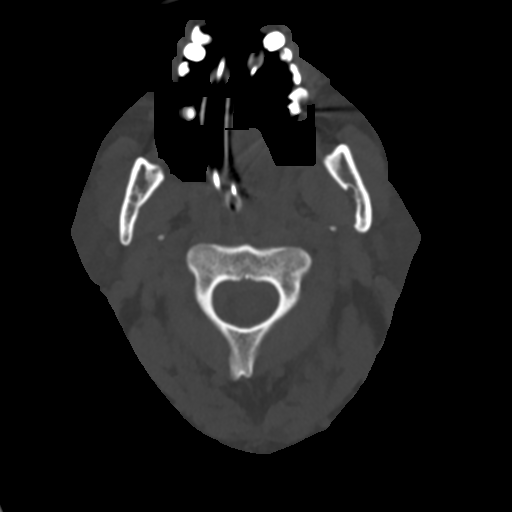
[im 96/115  soft-tissue]
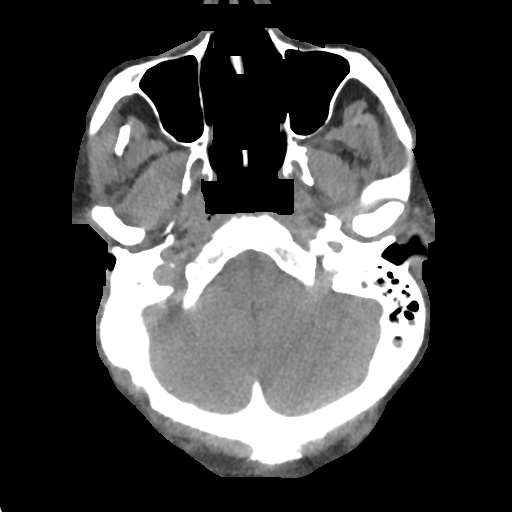
[im 96/115  bone]
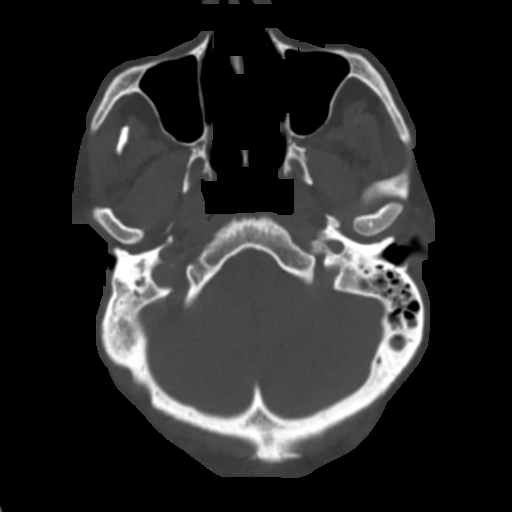

[Series 6: coronal st · coronal · 0.46mm/px · 3 of 101 slices shown]
[im 21/101  bone]
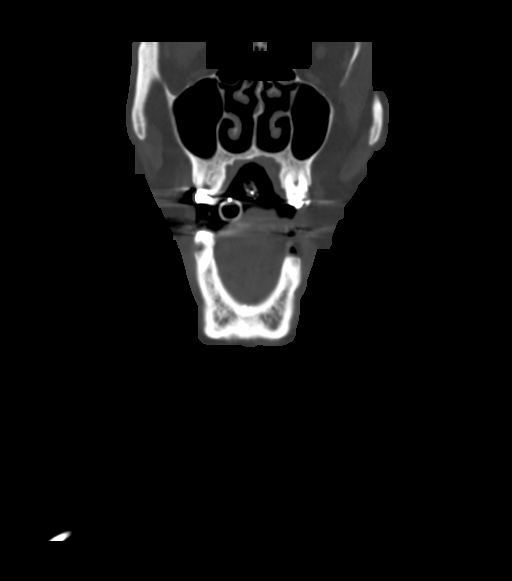
[im 41/101  bone]
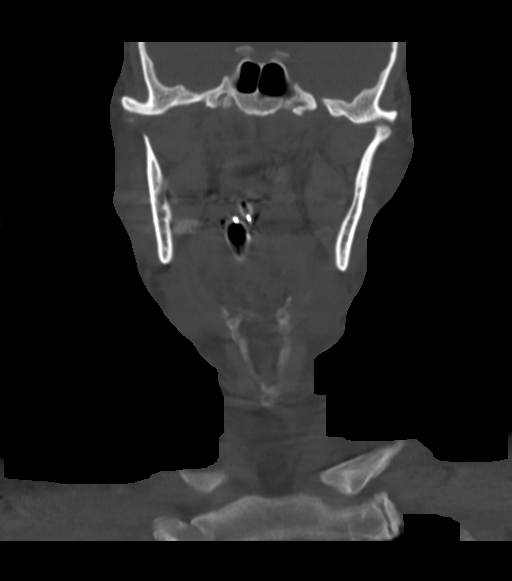
[im 61/101  bone]
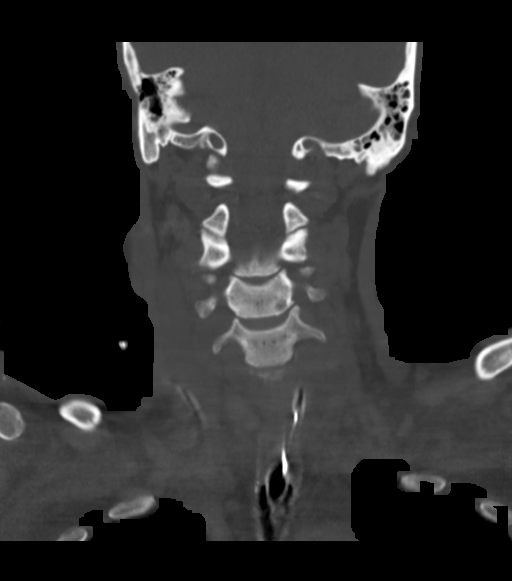

[Series 7: sagittal st · sagittal · 0.47mm/px · 5 of 101 slices shown, 6 images]
[im 34/101  bone]
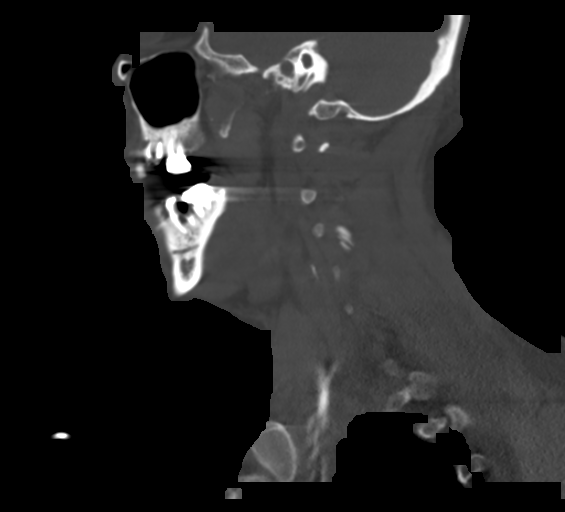
[im 42/101  bone]
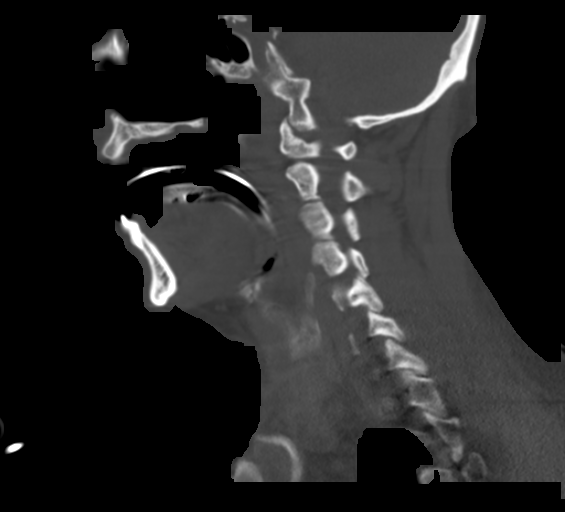
[im 51/101  soft-tissue]
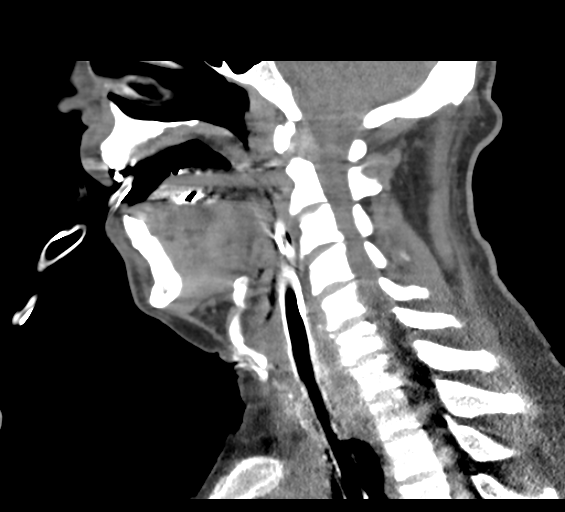
[im 51/101  bone]
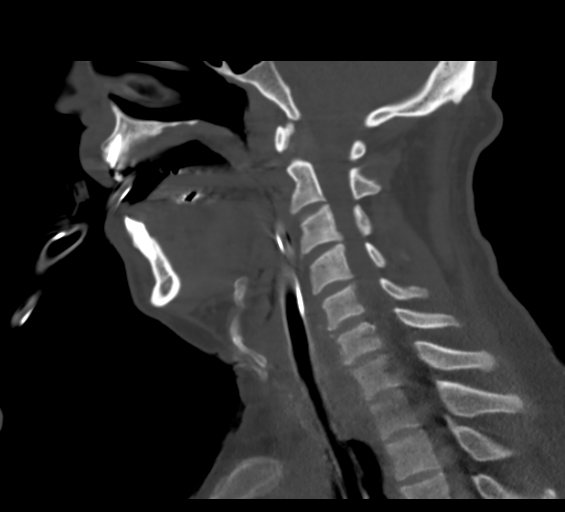
[im 59/101  bone]
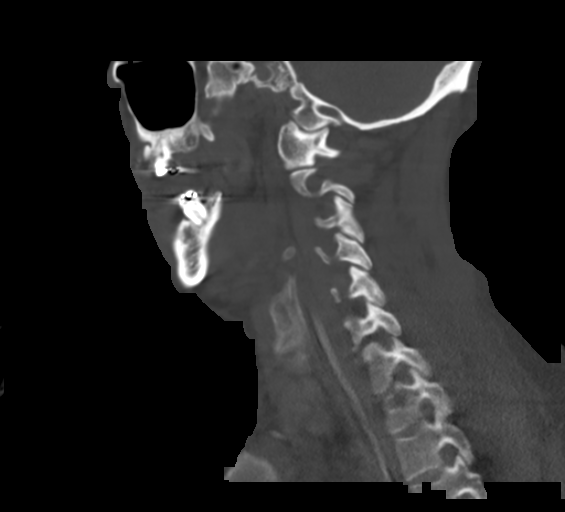
[im 67/101  bone]
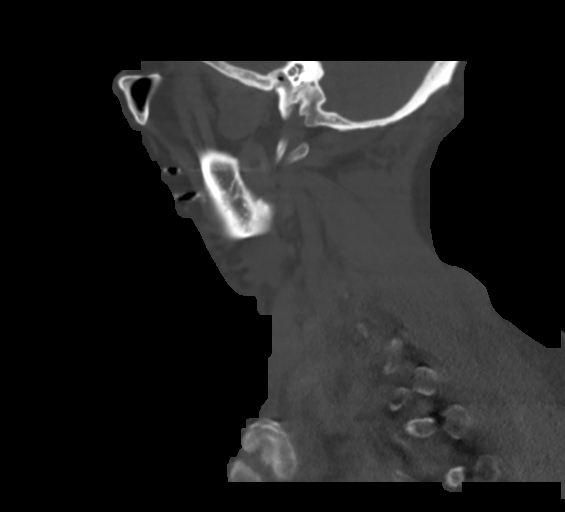

[Series 8: orthogonal st · axial · 0.39mm/px · z∈[-254,-216]mm · 2 of 113 slices shown]
[im 19/113  bone]
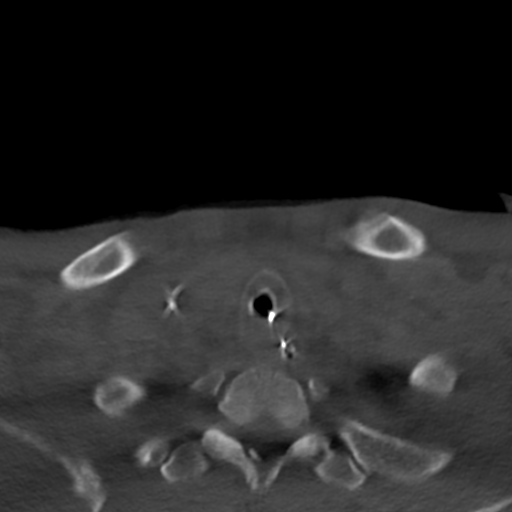
[im 38/113  bone]
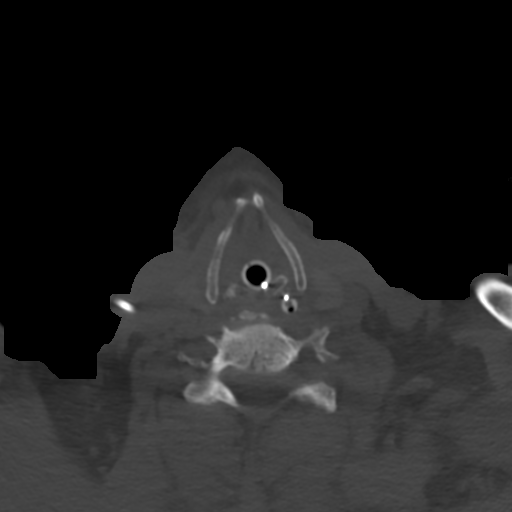

[15 of 33 positions shown; findings below may reference images not displayed]

FINDINGS: Lack of intravenous contrast to the limitation of this examination.

Right jugular central venous catheter is noted extending into the
right innominate vein with tip not visualized. The right jugular
vein does not appear distended or ill-defined however the study
cannot exclude venous thrombosis without intravenous contrast.
Ultrasound of the neck may be helpful to evaluate for jugular vein
thrombosis. Superior mediastinum does not show any mass or fluid
collection.

Negative for fluid collection or abscess in the neck. No mass lesion
is identified. No edema in the neck.

The patient is intubated. NG tube is in place.

Mild cervical degenerative changes. No acute bony change in the
cervical spine. Extensive dental infection is noted with multiple
caries and bony changes of chronic dental infection.
IMPRESSION: Right Jugular central venous catheter. No definite evidence of
jugular venous thrombosis however this study is limited to evaluate
for venous thrombosis without intravenous contrast. Consider
followup neck ultrasound.

Negative for mass, edema, or abscess in the neck.

## 2020-01-01 DIAGNOSIS — R509 Fever, unspecified: Secondary | ICD-10-CM

## 2020-01-01 DIAGNOSIS — R4182 Altered mental status, unspecified: Secondary | ICD-10-CM

## 2020-01-01 DIAGNOSIS — E8809 Other disorders of plasma-protein metabolism, not elsewhere classified: Secondary | ICD-10-CM

## 2020-01-01 DIAGNOSIS — E876 Hypokalemia: Secondary | ICD-10-CM

## 2020-01-24 DIAGNOSIS — J189 Pneumonia, unspecified organism: Secondary | ICD-10-CM

## 2020-01-24 DIAGNOSIS — R4182 Altered mental status, unspecified: Secondary | ICD-10-CM

## 2020-01-24 DIAGNOSIS — R509 Fever, unspecified: Secondary | ICD-10-CM

## 2021-09-04 DEATH — deceased
